# Patient Record
Sex: Female | Born: 1965
Health system: Southern US, Community
[De-identification: ages and names within clinical notes are randomized; demographics above are authoritative.]

## PROBLEM LIST (undated history)

## (undated) DIAGNOSIS — K602 Anal fissure, unspecified: Secondary | ICD-10-CM

## (undated) DIAGNOSIS — E8801 Alpha-1-antitrypsin deficiency: Secondary | ICD-10-CM

## (undated) DIAGNOSIS — F419 Anxiety disorder, unspecified: Secondary | ICD-10-CM

## (undated) DIAGNOSIS — J45909 Unspecified asthma, uncomplicated: Secondary | ICD-10-CM

## (undated) DIAGNOSIS — G43909 Migraine, unspecified, not intractable, without status migrainosus: Secondary | ICD-10-CM

## (undated) DIAGNOSIS — M797 Fibromyalgia: Secondary | ICD-10-CM

## (undated) DIAGNOSIS — J189 Pneumonia, unspecified organism: Secondary | ICD-10-CM

## (undated) DIAGNOSIS — C801 Malignant (primary) neoplasm, unspecified: Secondary | ICD-10-CM

## (undated) DIAGNOSIS — M329 Systemic lupus erythematosus, unspecified: Secondary | ICD-10-CM

## (undated) DIAGNOSIS — H3530 Unspecified macular degeneration: Secondary | ICD-10-CM

## (undated) DIAGNOSIS — N3010 Interstitial cystitis (chronic) without hematuria: Secondary | ICD-10-CM

## (undated) DIAGNOSIS — K5090 Crohn's disease, unspecified, without complications: Secondary | ICD-10-CM

## (undated) HISTORY — PX: WRIST SURGERY: SHX841

## (undated) HISTORY — DX: Alpha-1-antitrypsin deficiency: E88.01

## (undated) HISTORY — DX: Unspecified asthma, uncomplicated: J45.909

## (undated) HISTORY — PX: BLADDER SURGERY: SHX569

## (undated) HISTORY — DX: Systemic lupus erythematosus, unspecified: M32.9

## (undated) HISTORY — DX: Crohn's disease, unspecified, without complications: K50.90

## (undated) HISTORY — PX: KNEE ARTHROSCOPY: SUR90

## (undated) HISTORY — DX: Anxiety disorder, unspecified: F41.9

## (undated) HISTORY — PX: SALPINGECTOMY: SHX328

## (undated) HISTORY — DX: Migraine, unspecified, not intractable, without status migrainosus: G43.909

## (undated) HISTORY — PX: COLONOSCOPY: SHX174

## (undated) HISTORY — DX: Unspecified macular degeneration: H35.30

## (undated) HISTORY — DX: Anal fissure, unspecified: K60.2

## (undated) HISTORY — PX: OOPHORECTOMY: SHX86

## (undated) HISTORY — DX: Pneumonia, unspecified organism: J18.9

## (undated) HISTORY — DX: Fibromyalgia: M79.7

## (undated) HISTORY — DX: Interstitial cystitis (chronic) without hematuria: N30.10

## (undated) HISTORY — PX: CYSTOSCOPY: SUR368

## (undated) HISTORY — DX: Malignant (primary) neoplasm, unspecified: C80.1

## (undated) HISTORY — PX: OTHER SURGICAL HISTORY: SHX169

## (undated) HISTORY — PX: TONSILLECTOMY: SUR1361

---

## 1997-12-07 ENCOUNTER — Other Ambulatory Visit: Admission: RE | Admit: 1997-12-07 | Discharge: 1997-12-07 | Payer: Self-pay | Admitting: Obstetrics and Gynecology

## 1998-06-23 ENCOUNTER — Ambulatory Visit (HOSPITAL_COMMUNITY): Admission: RE | Admit: 1998-06-23 | Discharge: 1998-06-23 | Payer: Self-pay | Admitting: Urology

## 1998-12-13 ENCOUNTER — Other Ambulatory Visit: Admission: RE | Admit: 1998-12-13 | Discharge: 1998-12-13 | Payer: Self-pay | Admitting: Obstetrics and Gynecology

## 1999-03-21 ENCOUNTER — Encounter: Admission: RE | Admit: 1999-03-21 | Discharge: 1999-06-19 | Payer: Self-pay | Admitting: Urology

## 2000-01-22 ENCOUNTER — Other Ambulatory Visit: Admission: RE | Admit: 2000-01-22 | Discharge: 2000-01-22 | Payer: Self-pay | Admitting: Obstetrics and Gynecology

## 2001-06-25 ENCOUNTER — Other Ambulatory Visit: Admission: RE | Admit: 2001-06-25 | Discharge: 2001-06-25 | Payer: Self-pay | Admitting: Obstetrics and Gynecology

## 2002-02-16 ENCOUNTER — Encounter (INDEPENDENT_AMBULATORY_CARE_PROVIDER_SITE_OTHER): Payer: Self-pay | Admitting: Specialist

## 2002-02-16 ENCOUNTER — Ambulatory Visit (HOSPITAL_BASED_OUTPATIENT_CLINIC_OR_DEPARTMENT_OTHER): Admission: RE | Admit: 2002-02-16 | Discharge: 2002-02-16 | Payer: Self-pay | Admitting: Orthopedic Surgery

## 2002-05-07 ENCOUNTER — Encounter: Payer: Self-pay | Admitting: Emergency Medicine

## 2002-05-08 ENCOUNTER — Encounter (INDEPENDENT_AMBULATORY_CARE_PROVIDER_SITE_OTHER): Payer: Self-pay | Admitting: Specialist

## 2002-05-09 ENCOUNTER — Inpatient Hospital Stay (HOSPITAL_COMMUNITY): Admission: AD | Admit: 2002-05-09 | Discharge: 2002-05-12 | Payer: Self-pay | Admitting: Obstetrics and Gynecology

## 2002-07-22 ENCOUNTER — Other Ambulatory Visit: Admission: RE | Admit: 2002-07-22 | Discharge: 2002-07-22 | Payer: Self-pay | Admitting: Obstetrics and Gynecology

## 2003-08-24 ENCOUNTER — Other Ambulatory Visit: Admission: RE | Admit: 2003-08-24 | Discharge: 2003-08-24 | Payer: Self-pay | Admitting: Obstetrics and Gynecology

## 2004-03-28 ENCOUNTER — Encounter: Payer: Self-pay | Admitting: Gastroenterology

## 2004-03-28 ENCOUNTER — Ambulatory Visit (HOSPITAL_COMMUNITY): Admission: RE | Admit: 2004-03-28 | Discharge: 2004-03-28 | Payer: Self-pay | Admitting: Unknown Physician Specialty

## 2004-03-28 ENCOUNTER — Encounter (INDEPENDENT_AMBULATORY_CARE_PROVIDER_SITE_OTHER): Payer: Self-pay | Admitting: Specialist

## 2004-07-27 ENCOUNTER — Ambulatory Visit: Payer: Self-pay | Admitting: Gastroenterology

## 2004-08-03 ENCOUNTER — Ambulatory Visit: Payer: Self-pay | Admitting: Gastroenterology

## 2004-08-13 ENCOUNTER — Ambulatory Visit: Payer: Self-pay | Admitting: Gastroenterology

## 2004-08-21 ENCOUNTER — Ambulatory Visit: Payer: Self-pay | Admitting: Gastroenterology

## 2004-09-12 ENCOUNTER — Other Ambulatory Visit: Admission: RE | Admit: 2004-09-12 | Discharge: 2004-09-12 | Payer: Self-pay | Admitting: Obstetrics and Gynecology

## 2004-09-18 ENCOUNTER — Ambulatory Visit: Payer: Self-pay | Admitting: Gastroenterology

## 2004-10-25 ENCOUNTER — Ambulatory Visit: Payer: Self-pay | Admitting: Hematology and Oncology

## 2004-12-11 ENCOUNTER — Ambulatory Visit (HOSPITAL_COMMUNITY): Admission: RE | Admit: 2004-12-11 | Discharge: 2004-12-11 | Payer: Self-pay | Admitting: Obstetrics and Gynecology

## 2004-12-11 ENCOUNTER — Ambulatory Visit: Payer: Self-pay | Admitting: Family Medicine

## 2005-03-07 ENCOUNTER — Inpatient Hospital Stay (HOSPITAL_COMMUNITY): Admission: AD | Admit: 2005-03-07 | Discharge: 2005-03-07 | Payer: Self-pay | Admitting: Obstetrics and Gynecology

## 2005-03-09 ENCOUNTER — Inpatient Hospital Stay (HOSPITAL_COMMUNITY): Admission: AD | Admit: 2005-03-09 | Discharge: 2005-03-09 | Payer: Self-pay | Admitting: Obstetrics and Gynecology

## 2005-04-01 ENCOUNTER — Inpatient Hospital Stay (HOSPITAL_COMMUNITY): Admission: RE | Admit: 2005-04-01 | Discharge: 2005-04-04 | Payer: Self-pay | Admitting: Obstetrics and Gynecology

## 2005-04-08 ENCOUNTER — Inpatient Hospital Stay (HOSPITAL_COMMUNITY): Admission: AD | Admit: 2005-04-08 | Discharge: 2005-04-08 | Payer: Self-pay | Admitting: Obstetrics and Gynecology

## 2005-04-26 ENCOUNTER — Inpatient Hospital Stay (HOSPITAL_COMMUNITY): Admission: AD | Admit: 2005-04-26 | Discharge: 2005-04-26 | Payer: Self-pay | Admitting: Obstetrics and Gynecology

## 2005-05-14 ENCOUNTER — Other Ambulatory Visit: Admission: RE | Admit: 2005-05-14 | Discharge: 2005-05-14 | Payer: Self-pay | Admitting: Obstetrics and Gynecology

## 2005-06-04 ENCOUNTER — Ambulatory Visit: Payer: Self-pay | Admitting: Family Medicine

## 2005-09-16 DIAGNOSIS — J189 Pneumonia, unspecified organism: Secondary | ICD-10-CM

## 2005-09-16 HISTORY — DX: Pneumonia, unspecified organism: J18.9

## 2005-11-20 ENCOUNTER — Ambulatory Visit: Payer: Self-pay | Admitting: Internal Medicine

## 2006-10-31 ENCOUNTER — Ambulatory Visit: Payer: Self-pay | Admitting: Family Medicine

## 2007-07-29 ENCOUNTER — Ambulatory Visit: Payer: Self-pay | Admitting: Internal Medicine

## 2007-07-29 ENCOUNTER — Encounter: Payer: Self-pay | Admitting: Family Medicine

## 2007-07-29 ENCOUNTER — Ambulatory Visit: Payer: Self-pay | Admitting: Cardiovascular Disease

## 2007-07-29 ENCOUNTER — Observation Stay (HOSPITAL_COMMUNITY): Admission: EM | Admit: 2007-07-29 | Discharge: 2007-07-30 | Payer: Self-pay | Admitting: *Deleted

## 2007-07-30 ENCOUNTER — Encounter: Payer: Self-pay | Admitting: Internal Medicine

## 2007-08-04 ENCOUNTER — Ambulatory Visit: Payer: Self-pay | Admitting: Family Medicine

## 2007-08-04 DIAGNOSIS — L930 Discoid lupus erythematosus: Secondary | ICD-10-CM | POA: Insufficient documentation

## 2007-08-04 DIAGNOSIS — M329 Systemic lupus erythematosus, unspecified: Secondary | ICD-10-CM | POA: Insufficient documentation

## 2007-08-04 DIAGNOSIS — R079 Chest pain, unspecified: Secondary | ICD-10-CM | POA: Insufficient documentation

## 2007-08-04 DIAGNOSIS — N3010 Interstitial cystitis (chronic) without hematuria: Secondary | ICD-10-CM | POA: Insufficient documentation

## 2007-08-04 DIAGNOSIS — J069 Acute upper respiratory infection, unspecified: Secondary | ICD-10-CM | POA: Insufficient documentation

## 2007-08-04 DIAGNOSIS — Z87448 Personal history of other diseases of urinary system: Secondary | ICD-10-CM | POA: Insufficient documentation

## 2007-08-04 DIAGNOSIS — M797 Fibromyalgia: Secondary | ICD-10-CM | POA: Insufficient documentation

## 2007-08-04 LAB — CONVERTED CEMR LAB
Bilirubin Urine: NEGATIVE
Glucose, Urine, Semiquant: NEGATIVE
Ketones, urine, test strip: NEGATIVE
Nitrite: NEGATIVE
Protein, U semiquant: NEGATIVE

## 2007-08-05 LAB — CONVERTED CEMR LAB
ALT: 12 units/L (ref 0–35)
Albumin: 4.6 g/dL (ref 3.5–5.2)
BUN: 11 mg/dL (ref 6–23)
Bilirubin, Direct: 0.1 mg/dL (ref 0.0–0.3)
Calcium: 9.6 mg/dL (ref 8.4–10.5)
Eosinophils Relative: 1.6 % (ref 0.0–5.0)
Folate: 14.4 ng/mL
GFR calc Af Amer: 120 mL/min
GFR calc non Af Amer: 99 mL/min
Glucose, Bld: 96 mg/dL (ref 70–99)
Lymphocytes Relative: 35.1 % (ref 12.0–46.0)
MCV: 91.5 fL (ref 78.0–100.0)
Monocytes Relative: 8.7 % (ref 3.0–11.0)
Neutrophils Relative %: 53.9 % (ref 43.0–77.0)
Potassium: 4.2 meq/L (ref 3.5–5.1)
RBC: 4.35 M/uL (ref 3.87–5.11)
Total Bilirubin: 0.9 mg/dL (ref 0.3–1.2)
Total Protein: 8 g/dL (ref 6.0–8.3)

## 2007-08-07 ENCOUNTER — Encounter (INDEPENDENT_AMBULATORY_CARE_PROVIDER_SITE_OTHER): Payer: Self-pay | Admitting: *Deleted

## 2007-08-19 ENCOUNTER — Ambulatory Visit: Payer: Self-pay

## 2007-08-19 ENCOUNTER — Encounter: Payer: Self-pay | Admitting: Family Medicine

## 2007-11-03 ENCOUNTER — Ambulatory Visit: Payer: Self-pay | Admitting: Family Medicine

## 2007-11-03 ENCOUNTER — Telehealth (INDEPENDENT_AMBULATORY_CARE_PROVIDER_SITE_OTHER): Payer: Self-pay | Admitting: *Deleted

## 2007-11-03 DIAGNOSIS — R300 Dysuria: Secondary | ICD-10-CM | POA: Insufficient documentation

## 2007-11-03 DIAGNOSIS — R0602 Shortness of breath: Secondary | ICD-10-CM | POA: Insufficient documentation

## 2007-11-03 DIAGNOSIS — B9789 Other viral agents as the cause of diseases classified elsewhere: Secondary | ICD-10-CM | POA: Insufficient documentation

## 2007-11-03 DIAGNOSIS — R599 Enlarged lymph nodes, unspecified: Secondary | ICD-10-CM | POA: Insufficient documentation

## 2007-11-03 DIAGNOSIS — R509 Fever, unspecified: Secondary | ICD-10-CM | POA: Insufficient documentation

## 2007-11-03 LAB — CONVERTED CEMR LAB
Protein, U semiquant: NEGATIVE
Specific Gravity, Urine: 1.02
WBC Urine, dipstick: NEGATIVE

## 2007-11-04 ENCOUNTER — Encounter: Payer: Self-pay | Admitting: Family Medicine

## 2007-11-04 ENCOUNTER — Telehealth (INDEPENDENT_AMBULATORY_CARE_PROVIDER_SITE_OTHER): Payer: Self-pay | Admitting: *Deleted

## 2007-11-04 LAB — CONVERTED CEMR LAB
ALT: 10 units/L (ref 0–35)
Basophils Relative: 0 % (ref 0.0–1.0)
Calcium: 9.3 mg/dL (ref 8.4–10.5)
Eosinophils Absolute: 0 10*3/uL (ref 0.0–0.6)
Eosinophils Relative: 0.1 % (ref 0.0–5.0)
GFR calc Af Amer: 103 mL/min
GFR calc non Af Amer: 85 mL/min
Glucose, Bld: 120 mg/dL — ABNORMAL HIGH (ref 70–99)
MCHC: 33.5 g/dL (ref 30.0–36.0)
MCV: 90.4 fL (ref 78.0–100.0)
Monocytes Absolute: 0.5 10*3/uL (ref 0.2–0.7)
Neutrophils Relative %: 93.8 % — ABNORMAL HIGH (ref 43.0–77.0)
Potassium: 4 meq/L (ref 3.5–5.1)
RDW: 12.4 % (ref 11.5–14.6)
Sodium: 138 meq/L (ref 135–145)
Total Bilirubin: 1.2 mg/dL (ref 0.3–1.2)
Total Protein: 7.6 g/dL (ref 6.0–8.3)

## 2007-11-05 ENCOUNTER — Ambulatory Visit: Payer: Self-pay | Admitting: Family Medicine

## 2007-11-05 DIAGNOSIS — E0590 Thyrotoxicosis, unspecified without thyrotoxic crisis or storm: Secondary | ICD-10-CM | POA: Insufficient documentation

## 2007-11-05 LAB — CONVERTED CEMR LAB: Rapid Strep: NEGATIVE

## 2007-11-06 ENCOUNTER — Telehealth (INDEPENDENT_AMBULATORY_CARE_PROVIDER_SITE_OTHER): Payer: Self-pay | Admitting: *Deleted

## 2007-11-19 ENCOUNTER — Ambulatory Visit: Payer: Self-pay | Admitting: Family Medicine

## 2007-11-19 DIAGNOSIS — D7289 Other specified disorders of white blood cells: Secondary | ICD-10-CM | POA: Insufficient documentation

## 2007-11-20 LAB — CONVERTED CEMR LAB
Eosinophils Relative: 1.1 % (ref 0.0–5.0)
Free T4: 0.8 ng/dL (ref 0.6–1.6)
Hemoglobin: 12.5 g/dL (ref 12.0–15.0)
Lymphocytes Relative: 33.2 % (ref 12.0–46.0)
Monocytes Absolute: 0.7 10*3/uL (ref 0.2–0.7)
Neutro Abs: 4.5 10*3/uL (ref 1.4–7.7)
Neutrophils Relative %: 55.7 % (ref 43.0–77.0)
RBC: 4.12 M/uL (ref 3.87–5.11)
RDW: 12.9 % (ref 11.5–14.6)
T3, Free: 3 pg/mL (ref 2.3–4.2)

## 2007-11-30 ENCOUNTER — Encounter (INDEPENDENT_AMBULATORY_CARE_PROVIDER_SITE_OTHER): Payer: Self-pay | Admitting: *Deleted

## 2007-12-02 ENCOUNTER — Telehealth (INDEPENDENT_AMBULATORY_CARE_PROVIDER_SITE_OTHER): Payer: Self-pay | Admitting: *Deleted

## 2007-12-03 ENCOUNTER — Ambulatory Visit: Payer: Self-pay | Admitting: Family Medicine

## 2007-12-03 ENCOUNTER — Telehealth: Payer: Self-pay | Admitting: Family Medicine

## 2007-12-03 DIAGNOSIS — R059 Cough, unspecified: Secondary | ICD-10-CM | POA: Insufficient documentation

## 2007-12-03 DIAGNOSIS — J020 Streptococcal pharyngitis: Secondary | ICD-10-CM | POA: Insufficient documentation

## 2007-12-03 DIAGNOSIS — R05 Cough: Secondary | ICD-10-CM

## 2007-12-03 LAB — CONVERTED CEMR LAB
Inflenza A Ag: NEGATIVE
Rapid Strep: POSITIVE

## 2007-12-13 LAB — CONVERTED CEMR LAB
AST: 19 units/L (ref 0–37)
BUN: 6 mg/dL (ref 6–23)
Basophils Absolute: 0.1 10*3/uL (ref 0.0–0.1)
Basophils Relative: 0.8 % (ref 0.0–1.0)
Bilirubin, Direct: 0.1 mg/dL (ref 0.0–0.3)
CO2: 25 meq/L (ref 19–32)
Calcium: 9.1 mg/dL (ref 8.4–10.5)
Chloride: 102 meq/L (ref 96–112)
Creatinine, Ser: 0.7 mg/dL (ref 0.4–1.2)
Eosinophils Absolute: 0 10*3/uL (ref 0.0–0.6)
Glucose, Bld: 92 mg/dL (ref 70–99)
Lymphocytes Relative: 7.2 % — ABNORMAL LOW (ref 12.0–46.0)
Monocytes Absolute: 1.1 10*3/uL — ABNORMAL HIGH (ref 0.2–0.7)
RBC: 4.18 M/uL (ref 3.87–5.11)
RDW: 13.4 % (ref 11.5–14.6)
Sodium: 137 meq/L (ref 135–145)
WBC: 13.1 10*3/uL — ABNORMAL HIGH (ref 4.5–10.5)

## 2007-12-14 ENCOUNTER — Encounter (INDEPENDENT_AMBULATORY_CARE_PROVIDER_SITE_OTHER): Payer: Self-pay | Admitting: *Deleted

## 2008-06-01 ENCOUNTER — Encounter: Payer: Self-pay | Admitting: Family Medicine

## 2008-06-29 DIAGNOSIS — K5090 Crohn's disease, unspecified, without complications: Secondary | ICD-10-CM | POA: Insufficient documentation

## 2008-08-05 ENCOUNTER — Ambulatory Visit: Payer: Self-pay | Admitting: Gastroenterology

## 2008-08-05 DIAGNOSIS — R197 Diarrhea, unspecified: Secondary | ICD-10-CM | POA: Insufficient documentation

## 2008-08-05 LAB — CONVERTED CEMR LAB
ALT: 14 units/L (ref 0–35)
Albumin: 4.4 g/dL (ref 3.5–5.2)
Alkaline Phosphatase: 63 units/L (ref 39–117)
BUN: 12 mg/dL (ref 6–23)
Bilirubin, Direct: 0.1 mg/dL (ref 0.0–0.3)
Chloride: 102 meq/L (ref 96–112)
Creatinine, Ser: 0.7 mg/dL (ref 0.4–1.2)
Folate: >20 ng/mL
GFR calc Af Amer: 119 mL/min
GFR calc non Af Amer: 99 mL/min
Glucose, Bld: 90 mg/dL (ref 70–99)
HCT: 39.2 % (ref 36.0–46.0)
Hemoglobin: 13.6 g/dL (ref 12.0–15.0)
Monocytes Absolute: 0.8 10*3/uL (ref 0.1–1.0)
Neutro Abs: 4.2 10*3/uL (ref 1.4–7.7)
Neutrophils Relative %: 51.6 % (ref 43.0–77.0)
Platelets: 269 10*3/uL (ref 150–400)
Potassium: 4.6 meq/L (ref 3.5–5.1)
RBC: 4.3 M/uL (ref 3.87–5.11)
RDW: 12.4 % (ref 11.5–14.6)
Sodium: 137 meq/L (ref 135–145)
TSH: 0.48 microintl units/mL (ref 0.35–5.50)
Total Bilirubin: 0.9 mg/dL (ref 0.3–1.2)
Total Protein: 7.5 g/dL (ref 6.0–8.3)
Transferrin: 312.2 mg/dL (ref >=212.0)

## 2008-08-19 ENCOUNTER — Ambulatory Visit: Payer: Self-pay | Admitting: Gastroenterology

## 2008-08-19 DIAGNOSIS — R143 Flatulence: Secondary | ICD-10-CM

## 2008-08-19 DIAGNOSIS — R142 Eructation: Secondary | ICD-10-CM

## 2008-08-19 DIAGNOSIS — R141 Gas pain: Secondary | ICD-10-CM | POA: Insufficient documentation

## 2008-09-01 ENCOUNTER — Ambulatory Visit: Payer: Self-pay | Admitting: Gastroenterology

## 2008-09-05 ENCOUNTER — Ambulatory Visit: Payer: Self-pay | Admitting: Genetic Counselor

## 2008-09-07 ENCOUNTER — Telehealth: Payer: Self-pay | Admitting: Gastroenterology

## 2008-09-30 ENCOUNTER — Ambulatory Visit: Payer: Self-pay | Admitting: Gastroenterology

## 2008-09-30 ENCOUNTER — Encounter: Payer: Self-pay | Admitting: Gastroenterology

## 2008-10-03 ENCOUNTER — Telehealth: Payer: Self-pay | Admitting: Gastroenterology

## 2008-10-06 ENCOUNTER — Ambulatory Visit: Payer: Self-pay | Admitting: Gastroenterology

## 2008-10-06 ENCOUNTER — Encounter: Payer: Self-pay | Admitting: Gastroenterology

## 2008-10-06 ENCOUNTER — Ambulatory Visit (HOSPITAL_COMMUNITY): Admission: RE | Admit: 2008-10-06 | Discharge: 2008-10-06 | Payer: Self-pay | Admitting: Gastroenterology

## 2008-10-31 ENCOUNTER — Encounter (INDEPENDENT_AMBULATORY_CARE_PROVIDER_SITE_OTHER): Payer: Self-pay | Admitting: *Deleted

## 2009-01-11 ENCOUNTER — Ambulatory Visit: Payer: Self-pay | Admitting: Family Medicine

## 2009-01-11 DIAGNOSIS — J019 Acute sinusitis, unspecified: Secondary | ICD-10-CM | POA: Insufficient documentation

## 2009-01-11 DIAGNOSIS — F329 Major depressive disorder, single episode, unspecified: Secondary | ICD-10-CM | POA: Insufficient documentation

## 2009-01-11 DIAGNOSIS — F3289 Other specified depressive episodes: Secondary | ICD-10-CM | POA: Insufficient documentation

## 2009-01-11 LAB — CONVERTED CEMR LAB: Rapid Strep: NEGATIVE

## 2009-04-24 ENCOUNTER — Telehealth (INDEPENDENT_AMBULATORY_CARE_PROVIDER_SITE_OTHER): Payer: Self-pay | Admitting: *Deleted

## 2009-05-25 ENCOUNTER — Ambulatory Visit: Payer: Self-pay | Admitting: Family Medicine

## 2009-06-29 ENCOUNTER — Telehealth: Payer: Self-pay | Admitting: Family Medicine

## 2009-06-29 ENCOUNTER — Ambulatory Visit: Payer: Self-pay | Admitting: Family Medicine

## 2009-08-25 ENCOUNTER — Telehealth (INDEPENDENT_AMBULATORY_CARE_PROVIDER_SITE_OTHER): Payer: Self-pay | Admitting: *Deleted

## 2009-08-29 ENCOUNTER — Encounter: Payer: Self-pay | Admitting: Family Medicine

## 2009-09-21 ENCOUNTER — Telehealth (INDEPENDENT_AMBULATORY_CARE_PROVIDER_SITE_OTHER): Payer: Self-pay | Admitting: *Deleted

## 2009-10-05 ENCOUNTER — Telehealth: Payer: Self-pay | Admitting: Family Medicine

## 2009-11-23 ENCOUNTER — Telehealth: Payer: Self-pay | Admitting: Family Medicine

## 2009-11-23 ENCOUNTER — Encounter: Payer: Self-pay | Admitting: Family Medicine

## 2010-01-17 ENCOUNTER — Telehealth (INDEPENDENT_AMBULATORY_CARE_PROVIDER_SITE_OTHER): Payer: Self-pay | Admitting: *Deleted

## 2010-02-22 ENCOUNTER — Encounter: Payer: Self-pay | Admitting: Family Medicine

## 2010-02-26 ENCOUNTER — Telehealth (INDEPENDENT_AMBULATORY_CARE_PROVIDER_SITE_OTHER): Payer: Self-pay | Admitting: *Deleted

## 2010-03-05 ENCOUNTER — Ambulatory Visit: Payer: Self-pay | Admitting: Gastroenterology

## 2010-03-05 LAB — CONVERTED CEMR LAB
AST: 19 units/L (ref 0–37)
Basophils Absolute: 0 10*3/uL (ref 0.0–0.1)
Basophils Relative: 0.3 % (ref 0.0–3.0)
Bilirubin, Direct: 0.1 mg/dL (ref 0.0–0.3)
Calcium: 8.8 mg/dL (ref 8.4–10.5)
Chloride: 106 meq/L (ref 96–112)
Creatinine, Ser: 0.7 mg/dL (ref 0.4–1.2)
Eosinophils Absolute: 0.1 10*3/uL (ref 0.0–0.7)
Eosinophils Relative: 0.8 % (ref 0.0–5.0)
Ferritin: 14.3 ng/mL (ref 10.0–291.0)
Folate: 10.8 ng/mL
GFR calc non Af Amer: 99.19 mL/min (ref >60)
HCT: 37.2 % (ref 36.0–46.0)
Iron: 51 ug/dL (ref 42–145)
Lymphocytes Relative: 22.7 % (ref 12.0–46.0)
Lymphs Abs: 2 10*3/uL (ref 0.7–4.0)
Monocytes Absolute: 0.6 10*3/uL (ref 0.1–1.0)
Potassium: 4.1 meq/L (ref 3.5–5.1)
Vitamin B-12: 302 pg/mL (ref 211–911)

## 2010-03-06 ENCOUNTER — Encounter: Payer: Self-pay | Admitting: Gastroenterology

## 2010-03-16 ENCOUNTER — Telehealth: Payer: Self-pay | Admitting: Family Medicine

## 2010-04-12 ENCOUNTER — Telehealth: Payer: Self-pay | Admitting: Family Medicine

## 2010-04-13 ENCOUNTER — Telehealth (INDEPENDENT_AMBULATORY_CARE_PROVIDER_SITE_OTHER): Payer: Self-pay | Admitting: *Deleted

## 2010-05-25 ENCOUNTER — Telehealth: Payer: Self-pay | Admitting: Family Medicine

## 2010-06-14 ENCOUNTER — Telehealth (INDEPENDENT_AMBULATORY_CARE_PROVIDER_SITE_OTHER): Payer: Self-pay | Admitting: *Deleted

## 2010-06-21 ENCOUNTER — Ambulatory Visit: Payer: Self-pay | Admitting: Family Medicine

## 2010-07-12 ENCOUNTER — Telehealth: Payer: Self-pay | Admitting: Family Medicine

## 2010-09-12 ENCOUNTER — Ambulatory Visit
Admission: RE | Admit: 2010-09-12 | Discharge: 2010-09-12 | Payer: Self-pay | Source: Home / Self Care | Attending: Family | Admitting: Family

## 2010-09-27 ENCOUNTER — Telehealth: Payer: Self-pay | Admitting: Family Medicine

## 2010-10-08 ENCOUNTER — Telehealth (INDEPENDENT_AMBULATORY_CARE_PROVIDER_SITE_OTHER): Payer: Self-pay | Admitting: *Deleted

## 2010-10-16 NOTE — Letter (Signed)
Summary: [place]   Imported By: Edmonia James 10/03/2009 13:46:03  _____________________________________________________________________  External Attachment:    Type:   Image     Comment:   External Document

## 2010-10-16 NOTE — Progress Notes (Signed)
Summary: Schedule REV   Phone Note Outgoing Call Call back at [facility] Phone [phone]   Call placed by: Bernita Buffy CMA Deborra Medina),  Jan 17, 2010 9:18 AM Call placed to: Patient Summary of Call: pt needs rev, she has been [facility] and [facility] twice. She needs to follow up with Dr. Sharlett Iles.   Initial call taken by: Bernita Buffy CMA Deborra Medina),  Jan 17, 2010 9:18 AM  Follow-up for Phone Call        PT [place] Follow-up by: Lucien Mons,  Jan 22, 2010 3:12 PM

## 2010-10-16 NOTE — Progress Notes (Signed)
Summary: Refill Request  Phone Note Refill Request Call back at [phone] Message from:  Pharmacy on March 16, 2010 8:31 AM  Refills Requested: Medication #1:  ALPRAZOLAM 0.5 MG TABS tAKE ONE TO 3 TABLETS DAILY AS NEEDED.   Dosage confirmed as above?Dosage Confirmed   Supply Requested: 1 month   Last Refilled: 01/23/2010 [place]   Next Appointment Scheduled: none Initial call taken by: Osborn Coho,  March 16, 2010 8:32 AM  Follow-up for Phone Call        last ov- 06/29/09- acute only. Allyn Kenner CMA  March 16, 2010 8:39 AM   Additional Follow-up for Phone Call Additional follow up Details #1::        ok for #30, no refills. Additional Follow-up by: Annye Asa MD,  March 16, 2010 8:43 AM    Prescriptions: ALPRAZOLAM 0.5 MG TABS (ALPRAZOLAM) tAKE ONE TO 3 TABLETS DAILY AS NEEDED.  #30 x 0   Entered by:   Allyn Kenner CMA   Authorized by:   Annye Asa MD   Signed by:   Allyn Kenner CMA on 03/16/2010   Method used:   Printed then faxed to ...       [id number] (retail)       [address]       [place], [place]  [address]       Ph: [phone] or [phone]       Fax: [phone]   RxID:   [id number]

## 2010-10-16 NOTE — Letter (Signed)
Summary: [place]   Imported By: Edmonia James 12/06/2009 10:37:03  _____________________________________________________________________  External Attachment:    Type:   Image     Comment:   External Document

## 2010-10-16 NOTE — Progress Notes (Signed)
Summary: REFILL REQUEST  Phone Note Refill Request Call back at [phone] Message from:  Pharmacy on April 13, 2010 10:00 AM  Refills Requested: Medication #1:  ALPRAZOLAM 0.5 MG TABS tAKE ONE TO 3 TABLETS DAILY AS NEEDED.   Dosage confirmed as above?Dosage Confirmed   Supply Requested: 3 months   Notes: THIS WAS FILLED THURSDAY. HOWEVER PT USUALLY GETS #[address] [facility] [address]  Next Appointment Scheduled: NONE Initial call taken by: Osborn Coho,  April 13, 2010 10:01 AM  Follow-up for Phone Call        PT AWARE RX RESENT FOR #90 ............Marland KitchenFelecia Deloach CMA  April 13, 2010 10:38 AM     Prescriptions: ALPRAZOLAM 0.5 MG TABS (ALPRAZOLAM) tAKE ONE TO 3 TABLETS DAILY AS NEEDED.  #90 x 0   Entered by:   Rolla Flatten CMA   Authorized by:   Garnet Koyanagi DO   Signed by:   Rolla Flatten CMA on 04/13/2010   Method used:   Telephoned to ...       [id number] (retail)       [address]       [place], [place]  [address]       Ph: [phone] or [phone]       Fax: [phone]   RxID:   [id number]

## 2010-10-16 NOTE — Progress Notes (Signed)
Summary: BUPROPION WL 150 MG  REFILL  Phone Note Refill Request Message from:  Fax from Pharmacy on June 14, 2010 2:17 PM  Refills Requested: Medication #1:  WELLBUTRIN XL 150 MG XR24H-TAB 3 by mouth once daily   Last Refilled: 04/27/2010 [facility], [address],  FAX [phone]  Initial call taken by: Berneta Sages,  June 14, 2010 2:17 PM  Follow-up for Phone Call        Last OV 03/05/10 last filled 04/27/10. Please advise Follow-up by: Aron Baba CMA Deborra Medina),  June 14, 2010 3:29 PM  Additional Follow-up for Phone Call Additional follow up Details #1::        ok to refill x 6 months Additional Follow-up by: Garnet Koyanagi DO,  June 14, 2010 3:39 PM    Prescriptions: WELLBUTRIN XL 150 MG XR24H-TAB (BUPROPION HCL) 3 by mouth once daily  #90 Tablet x 6   Entered by:   Malachi Bonds CMA   Authorized by:   Garnet Koyanagi DO   Signed by:   Malachi Bonds CMA on 06/14/2010   Method used:   Electronically to        [id number] (retail)       [address]       [place], [place]  [address]       Ph: [phone] or [phone]       Fax: [phone]   RxID:   [id number]

## 2010-10-16 NOTE — Progress Notes (Signed)
Summary: sinus infection  Phone Note Call from Patient   Caller: Patient Summary of Call: pt called stating that she has a sinusitis infection and would like to be seen today. informed pt that dr Etter Sjogren does not have any opening for today but can look at another day pt decline stating that she has had this infection for 3 weeks and she really feels bad. pt states that she is going to UC...................Marland KitchenFelecia Deloach CMA  September 21, 2009 3:08 PM

## 2010-10-16 NOTE — Progress Notes (Signed)
Summary: REFILL  Phone Note Refill Request Message from:  Fax from Pharmacy on [place] [phone]  Refills Requested: Medication #1:  ALPRAZOLAM 0.5 MG TABS tAKE ONE TO 3 TABLETS DAILY AS NEEDED.   Last Refilled: 05/25/2009 last ov- 06/29/09  Initial call taken by: Silva Bandy,  October 05, 2009 10:15 AM  Follow-up for Phone Call        ok to refill x1 Follow-up by: Garnet Koyanagi DO,  October 05, 2009 11:07 AM    Prescriptions: ALPRAZOLAM 0.5 MG TABS (ALPRAZOLAM) tAKE ONE TO 3 TABLETS DAILY AS NEEDED.  #90 x 0   Entered by:   Allyn Kenner CMA   Authorized by:   Garnet Koyanagi DO   Signed by:   Allyn Kenner CMA on 10/05/2009   Method used:   Print then Give to Patient   RxID:   [id number]

## 2010-10-16 NOTE — Progress Notes (Signed)
Summary: Refil Request  Phone Note Refill Request Message from:  Pharmacy on [place] [facility] [address]. Fax #: [phone]  Refills Requested: Medication #1:  ALPRAZOLAM 0.5 MG TABS tAKE ONE TO 3 TABLETS DAILY AS NEEDED.   Dosage confirmed as above?Dosage Confirmed   Supply Requested: 1 month   Last Refilled: 10/05/2009 Next Appointment Scheduled: none Initial call taken by: Elna Breslow,  November 23, 2009 9:33 AM  Follow-up for Phone Call        last ov- 06/29/09. [place]  November 23, 2009 9:41 AM   Additional Follow-up for Phone Call Additional follow up Details #1::        ok to refill x1  1 refill Additional Follow-up by: Garnet Koyanagi DO,  November 23, 2009 9:44 AM    Prescriptions: ALPRAZOLAM 0.5 MG TABS (ALPRAZOLAM) tAKE ONE TO 3 TABLETS DAILY AS NEEDED.  #90 x 1   Entered by:   Allyn Kenner CMA   Authorized by:   Garnet Koyanagi DO   Signed by:   Allyn Kenner CMA on 11/23/2009   Method used:   Printed then faxed to ...       [id number] (retail)       [address]       [place], [place]  [address]       Ph: [phone] or [phone]       Fax: [phone]   RxID:   [id number]

## 2010-10-16 NOTE — Progress Notes (Signed)
Summary: ALPRAZOLAM REFILL  Phone Note Refill Request Message from:  Fax from Pharmacy on May 25, 2010 4:52 PM  Refills Requested: Medication #1:  ALPRAZOLAM 0.5 MG TABS tAKE ONE TO 3 TABLETS DAILY AS NEEDED. [facility], [address],   FAX - NOT CLEAR ON FAX--[phone]  Initial call taken by: Berneta Sages,  May 25, 2010 4:53 PM  Follow-up for Phone Call        last seen 06/29/09 and last filled 7/11 Follow-up by: Aron Baba CMA Deborra Medina),  May 25, 2010 5:47 PM  Additional Follow-up for Phone Call Additional follow up Details #1::        refill x1 Additional Follow-up by: Garnet Koyanagi DO,  May 26, 2010 11:16 AM    Prescriptions: ALPRAZOLAM 0.5 MG TABS (ALPRAZOLAM) tAKE ONE TO 3 TABLETS DAILY AS NEEDED.  #90 x 0   Entered by:   Aron Baba CMA ([place])   Authorized by:   Garnet Koyanagi DO   Signed by:   Aron Baba CMA ([place]) on 05/28/2010   Method used:   Printed then faxed to ...       [id number] (retail)       [address]       [place], [place]  [address]       Ph: [phone] or [phone]       Fax: [phone]   RxID:   [id number]

## 2010-10-16 NOTE — Assessment & Plan Note (Signed)
Summary: follow up...as.   History of Present Illness Visit Type: Follow-up Visit Primary GI MD: Verl Blalock MD FACP [place] Primary Provider: Garnet Koyanagi DO Requesting Provider: n/a Chief Complaint: follow up crohn's, pt has had food poisoning and is still having diarrhea from that History of Present Illness:   Sheila [name] is a long-term patient of mine who has Crohn's ileocolitis previously documented by colonoscopy and mucosal biopsies. She also has chronic anxiety, an element of IBS, and chronic interstitial cystitis managed by Dr. Alona Bene.  She been doing well until recently when she again experienced" food poisoning" consisting of abdominal cramping and watery diarrhea which seems to be resolving now after 2 weeks of conservative management. She initially had nausea and vomiting which also has abated. She gives a history of multiple recurrences of salmonellosis, etiology unclear. She has not had recent stool cultures, antibiotic exposure, travel, or sick family members at home. She in the past has had reactions to penicillin and ampicillin. She is on alprazolam and Wellbutrin for chronic anxiety syndrome. Other problems include fibromyalgia and questionable low-grade SLE.  She denies systemic complaints but does have some continued soft stooling and abdominal cramping without fever or chills. She denies abuse of alcohol, cigarettes, or NSAIDs.   GI Review of Systems    Reports bloating.      Denies acid reflux, belching, chest pain, dysphagia with liquids, dysphagia with solids, heartburn, loss of appetite, nausea, vomiting, vomiting blood, weight loss, and  weight gain.      Reports diarrhea.     Denies anal fissure, black tarry stools, change in bowel habit, constipation, diverticulosis, fecal incontinence, heme positive stool, hemorrhoids, irritable bowel syndrome, jaundice, light color stool, liver problems, rectal bleeding, and  rectal pain.    Current Medications  (verified): 1)  Alprazolam 0.5 Mg Tabs (Alprazolam) .... Take One To 3 Tablets Daily As Needed. 2)  Wellbutrin Xl 150 Mg Xr24h-Tab (Bupropion Hcl) .... 3 By Mouth Once Daily  Allergies (verified): 1)  ! Pcn 2)  ! Ampicillin 3)  ! Fentanyl Citrate (Fentanyl Citrate)  Past History:  Past medical, surgical, family and social histories (including risk factors) reviewed for relevance to current acute and chronic problems.  Past Medical History: Reviewed history from 08/04/2007 and no changes required. SLE Crohn's  Fibromyalgia ICS  Past Surgical History: Reviewed history from 08/19/2008 and no changes required. C- Section 2006 T & A Left knee arthroscopy Left wrist surgery x 2 (ganglion cyst removal) right fallopian tube removed bladder surgery (for interstitial cystitis)  Family History: Reviewed history from 08/19/2008 and no changes required. Family History of Breast Cancer:Maternal Grandmother, Maternal Great Aunts, Cousin Family History of Colitis/Crohn's: self Family History of Prostate Cancer: grandfather No FH of Colon Cancer:  Social History: Reviewed history from 08/19/2008 and no changes required. Married, 2 boys Patient has never smoked.  Alcohol Use - yes 1 a week Daily Caffeine Use  1/2 cup coffee in the AM Illicit Drug Use - no Patient gets regular exercise.  Review of Systems  The patient denies allergy/sinus, anemia, anxiety-new, arthritis/joint pain, back pain, blood in urine, breast changes/lumps, confusion, cough, coughing up blood, depression-new, fainting, fatigue, fever, headaches-new, hearing problems, heart murmur, heart rhythm changes, itching, menstrual pain, muscle pains/cramps, night sweats, nosebleeds, pregnancy symptoms, shortness of breath, skin rash, sleeping problems, sore throat, swelling of feet/legs, swollen lymph glands, thirst - excessive, urination - excessive, urination changes/pain, urine leakage, vision changes, and voice change.     Vital Signs:  Patient  profile:   45 year old female Height:      63.5 inches Weight:      127 pounds BMI:     22.22 Pulse rate:   72 / minute Pulse rhythm:   regular BP sitting:   90 / 62  (left arm) Cuff size:   regular  Vitals Entered By: Abelino Derrick CMA Deborra Medina) (March 05, 2010 10:01 AM)  Physical Exam  General:  Well developed, well nourished, no acute distress.healthy appearing.   Head:  Normocephalic and atraumatic. Eyes:  PERRLA, no icterus. Neck:  Supple; no masses or thyromegaly. Lungs:  Clear throughout to auscultation. Heart:  Regular rate and rhythm; no murmurs, rubs,  or bruits. Abdomen:  Soft, nontender and nondistended. No masses, hepatosplenomegaly or hernias noted. Normal bowel sounds.Mild distention noted with normal bowel sounds. Rectal:  No Evidence of fissures or fistulae. Rectal exam shows no masses or tenderness with soft mucousy stool which is bloody and guaiac positive. Msk:  Symmetrical with no gross deformities. Normal posture. Pulses:  Normal pulses noted. Extremities:  No clubbing, cyanosis, edema or deformities noted. Neurologic:  Alert and  oriented x4;  grossly normal neurologically. Psych:  Alert and cooperative. Normal mood and affect.   Impression & Recommendations:  Problem # 1:  CROHN'S DISEASE, LARGE INTESTINE (ICD-555.1) Assessment Deteriorated Labs and stool examinations including regular culture, O&P exam, and C. difficile toxin exam ordered. I have started her on Lialda 2.4 g a day pending further workup. I also will try to get her old records for review. Orders: TLB-CBC Platelet - w/Differential (85025-CBCD) TLB-BMP (Basic Metabolic Panel-BMET) (89211-HERDEYC) TLB-Hepatic/Liver Function Pnl (80076-HEPATIC) TLB-TSH (Thyroid Stimulating Hormone) (84443-TSH) TLB-B12, Serum-Total ONLY (14481-E56) TLB-Ferritin (31497-WYO) TLB-Folic Acid (Folate) (37858-IFO) TLB-IBC Pnl (Iron/FE;Transferrin) (83550-IBC) TLB-CRP-High Sensitivity  (C-Reactive Protein) (86140-FCRP) TLB-Sedimentation Rate (ESR) (85652-ESR) T-Culture, Stool (87045/87046-70140) T-Culture, C-Diff Toxin A/B (27741-28786) T-Stool for O&P (76720-94709)  Problem # 2:  DEPRESSIVE DISORDER (ICD-311) Assessment: Improved continue other medications per Dr. Etter Sjogren.  Problem # 3:  CHRONIC INTERSTITIAL CYSTITIS (ICD-595.1) Assessment: Improved Continued followup with Dr. Amalia Hailey as planned  Patient Instructions: 1)  Please go to the basement for lab work. 2)   Begin Lialda 2 tabd daily. 3)  The medication list was reviewed and reconciled.  All changed / newly prescribed medications were explained.  A complete medication list was provided to the patient / caregiver. 4)  Copy sent to : Dr[facility]ona [facility]ene at [facility] Department of urology and Dr. Garnet Koyanagi and primary care 5)  Please continue current medications.  Prescriptions: LIALDA 1.2 GM TBEC (MESALAMINE) Take 2 tabs q am  #60 x 6   Entered by:   Alberteen Spindle RN   Authorized by:   Sable Feil MD [facility]   Signed by:   Alberteen Spindle RN on 03/05/2010   Method used:   Electronically to        [id number] (retail)       [address]       [place], [place]  [address]       Ph: [phone] or [phone]       Fax: [phone]   RxID:   [id number] LIALDA 1.2 GM TBEC (MESALAMINE) Take 2 tabs q am  #20 x 2   Entered by:   Alberteen Spindle RN   Authorized by:   Sable Feil MD [facility]   Signed by:   Alberteen Spindle RN  on 03/05/2010   Method used:   Print then Give to Patient   RxID:   [id number]

## 2010-10-16 NOTE — Progress Notes (Signed)
Summary: refill  Phone Note Refill Request Message from:  Fax from Pharmacy on April 12, 2010 11:14 AM  Refills Requested: Medication #1:  ALPRAZOLAM 0.5 MG TABS tAKE ONE TO 3 TABLETS DAILY AS NEEDED. [facility] [phone]  Initial call taken by: Arbie [place],  April 12, 2010 11:16 AM  Follow-up for Phone Call        last filled 03-16-10 #30, last ov 06-29-09...Marland KitchenMarland KitchenFelecia Deloach CMA  April 12, 2010 2:21 PM   Additional Follow-up for Phone Call Additional follow up Details #1::        refill x1 Additional Follow-up by: Garnet Koyanagi DO,  April 12, 2010 2:37 PM    Prescriptions: ALPRAZOLAM 0.5 MG TABS (ALPRAZOLAM) tAKE ONE TO 3 TABLETS DAILY AS NEEDED.  #30 x 0   Entered by:   Ernestene Mention CMA   Authorized by:   Garnet Koyanagi DO   Signed by:   Ernestene Mention CMA on 04/12/2010   Method used:   Telephoned to ...       [id number] (retail)       [address]       [place], [place]  [address]       Ph: [phone] or [phone]       Fax: [phone]   RxID:   [id number]

## 2010-10-16 NOTE — Progress Notes (Signed)
Summary: diarrhea   Phone Note Call from Patient Call back at Home Phone [phone]   Caller: Patient Summary of Call: patient left msg on voicemail stating that she has had salmonella 5 times in her lifetime. Wondering if she may have food poisoning has had an episode of vomiting and constant diarrhea.   Follow-up for Phone Call        spoke w/ patient having diarrhea xthurs. along w/ one episode of vomiting says she has been waiting things out to see if they are going to get better but it's not getting there also having some stomach and joint pains along w/ weakness everything she eats comes right back out. has been doing bland diet but no help. Scheduled office visit for tomorrow w/ Dr. Etter Sjogren at 11:15am informed to try some immodium to help w/ diarrhea but still continue bland diet..........Marland KitchenMalachi Bonds  February 26, 2010 4:19 PM

## 2010-10-16 NOTE — Letter (Signed)
Summary: [place]   Imported By: Edmonia James 12/01/2009 10:14:38  _____________________________________________________________________  External Attachment:    Type:   Image     Comment:   External Document

## 2010-10-16 NOTE — Letter (Signed)
Summary: Sheila [name]   Imported By: Edmonia James 09/21/2009 09:23:04  _____________________________________________________________________  External Attachment:    Type:   Image     Comment:   External Document

## 2010-10-16 NOTE — Progress Notes (Signed)
Summary: ALPRAZOLAM REFILL  Phone Note Refill Request Message from:  Fax from Pharmacy on July 12, 2010 2:04 PM  Refills Requested: Medication #1:  ALPRAZOLAM 0.5 MG TABS tAKE ONE TO 3 TABLETS DAILY AS NEEDED.   Last Refilled: 05/28/2010 [facility], [place],  [place]  [phone]  Initial call taken by: Berneta Sages,  July 12, 2010 2:06 PM  Follow-up for Phone Call        last ov 06/29/2009. Follow-up by: Allyn Kenner CMA,  July 12, 2010 2:53 PM  Additional Follow-up for Phone Call Additional follow up Details #1::        refill x1 Additional Follow-up by: Garnet Koyanagi DO,  July 12, 2010 2:59 PM    Prescriptions: ALPRAZOLAM 0.5 MG TABS (ALPRAZOLAM) tAKE ONE TO 3 TABLETS DAILY AS NEEDED.  #90 x 0   Entered by:   Aron Baba CMA ([place])   Authorized by:   Garnet Koyanagi DO   Signed by:   Aron Baba CMA ([place]) on 07/12/2010   Method used:   Printed then faxed to ...       [id number] (retail)       [address]       [place], [place]  [address]       Ph: [phone] or [phone]       Fax: [phone]   RxID:   [id number]

## 2010-10-16 NOTE — Assessment & Plan Note (Signed)
Summary: TETNUS SHOT///SPH   Nurse Visit   Allergies: 1)  ! Pcn 2)  ! Ampicillin 3)  ! Fentanyl Citrate (Fentanyl Citrate)  Immunizations Administered:  Tetanus Vaccine:    Vaccine Type: Tdap    Site: right deltoid    Mfr: Merck    Dose: 0.5 ml    Route: IM    Given by: Aron Baba CMA ([place])    Exp. Date: 06/06/2012    Lot #: [id number]    VIS given: 08/03/08 version given June 21, 2010.  Orders Added: 1)  Tdap => 56yr IM [[70340]2)  Admin 1st Vaccine [[35248]

## 2010-10-18 NOTE — Progress Notes (Signed)
Summary: Refill Request  Phone Note Refill Request Call back at [phone] Message from:  Pharmacy on October 08, 2010 9:48 AM  Refills Requested: Medication #1:  ALPRAZOLAM 0.5 MG TABS tAKE ONE TO 3 TABLETS DAILY AS NEEDED.   Dosage confirmed as above?Dosage Confirmed   Supply Requested: 90   Last Refilled: 08/22/2010 [facility] on [address].   Next Appointment Scheduled: none Initial call taken by: Elna Breslow,  October 08, 2010 9:48 AM    Prescriptions: ALPRAZOLAM 0.5 MG TABS (ALPRAZOLAM) tAKE ONE TO 3 TABLETS DAILY AS NEEDED.  #90 x 0   Entered by:   Aron Baba CMA ([place])   Authorized by:   Garnet Koyanagi DO   Signed by:   Aron Baba CMA ([place]) on 10/08/2010   Method used:   Printed then faxed to ...       [id number] (retail)       [address]       [place], [place]  [address]       Ph: [phone] or [phone]       Fax: [phone]   RxID:   [id number] ALPRAZOLAM 0.5 MG TABS (ALPRAZOLAM) tAKE ONE TO 3 TABLETS DAILY AS NEEDED.  #90 x 0   Entered by:   Aron Baba CMA ([place])   Authorized by:   Rolla Flatten CMA   Signed by:   Aron Baba CMA ([place]) on 10/08/2010   Method used:   Printed then faxed to ...       [id number] (retail)       [address]       [place], [place]  [address]       Ph: [phone] or [phone]       Fax: [phone]   RxID:   [id number]

## 2010-10-18 NOTE — Progress Notes (Signed)
Summary: Symtpoms back after ABX  Phone Note Call from Patient Call back at Work Phone [phone]   Caller: Patient Summary of Call: Patient called this morning stating that she is starting to feel worse now that she has finished her first 10 days of abx. She stated that she was told by Debbrah Alar (which she saw due to [place] [facility] [facility] being booked) if she that if her symtpoms restarted you would call in another round of abx or something different. She has exteme pressure under her eyes and in her head. She feels very "clogged" with mucos. She felt much better when on the abx and now all the symptoms have come back. Please advise due to Lenna Sciara being out of the office until the 16th.   Pharmacy: [facility] on [address].  Initial call taken by: Elna Breslow,  September 27, 2010 8:45 AM  Follow-up for Phone Call        avelox 400 #10 1 by mouth once daily ---if no better after that she will need to be seen Follow-up by: Garnet Koyanagi DO,  September 27, 2010 9:46 AM  Additional Follow-up for Phone Call Additional follow up Details #1::        pt aware.... Aron Baba CMA Deborra Medina)  September 27, 2010 11:58 AM     New/Updated Medications: AVELOX 400 MG TABS (MOXIFLOXACIN HCL) 1 by mouth once daily x10 days Prescriptions: AVELOX 400 MG TABS (MOXIFLOXACIN HCL) 1 by mouth once daily x10 days  #10 x 0   Entered by:   Aron Baba CMA ([employer])   Authorized by:   Garnet Koyanagi DO   Signed by:   Aron Baba CMA ([place]) on 09/27/2010   Method used:   Faxed to ...       [id number] (retail)       [address]       [place], [place]  [address]       Ph: [phone] or [phone]       Fax: [phone]   RxID:   [id number]

## 2010-10-18 NOTE — Assessment & Plan Note (Signed)
Summary: sinus infection//sore throat//lch--Rm 5   Vital Signs:  Patient profile:   45 year old female Height:      63.5 inches Weight:      128.25 pounds BMI:     22.44 O2 Sat:      98 % on Room air Temp:     98.1 degrees F oral Pulse rate:   90 / minute Pulse rhythm:   regular Resp:     16 per minute BP sitting:   114 / 78  (right arm) Cuff size:   regular  Vitals Entered By: Kelle Darting CMA Deborra Medina) (September 12, 2010 1:23 PM)  O2 Flow:  Room air CC: Pt states she has had intermittent sinus drainage, swollen glands in her neck and scratchy throat x 2 months. OTC meds have not helped. Is Patient Diabetic? No Pain Assessment Patient in pain? no      Comments Pt agrees all med doses and directions are correct.  Pt states she has stopped Lialda as she does not need at this time. Gilmore Laroche Fergerson CMA Deborra Medina)  September 12, 2010 1:32 PM    Primary Care Provider:  Garnet Koyanagi DO  CC:  Pt states she has had intermittent sinus drainage and swollen glands in her neck and scratchy throat x 2 months. OTC meds have not helped.Marland Kitchen  History of Present Illness: Ms.  Nodine is a 45 year old female who presents with CC of sinus drainage.  + Facial pressure, nasal discharge is greenish brown.   Symptoms started 2 months ago- though severity waxes and wanes.  Associated with scratchy throat, bodya aches, chills, though denies fever.  Swollen glands in neck. Tried multiple OTC prep without improvement.   Allergies: 1)  ! Pcn 2)  ! Ampicillin 3)  ! Fentanyl Citrate (Fentanyl Citrate)  Review of Systems       see HPI  Physical Exam  General:  Well-developed,well-nourished,in no acute distress; alert,appropriate and cooperative throughout examination Head:  Normocephalic and atraumatic without obvious abnormalities. No apparent alopecia or balding. Eyes:  PERRLA, sclear clear, EOM's intact Mouth:  Mild pharyngeal erythema without exudates Neck:  No deformities, masses, or  tenderness noted. Lungs:  Normal respiratory effort, chest expands symmetrically. Lungs are clear to auscultation, no crackles or wheezes. Heart:  Normal rate and regular rhythm. S1 and S2 normal without gallop, murmur, click, rub or other extra sounds. Psych:  Cognition and judgment appear intact. Alert and cooperative with normal attention span and concentration. No apparent delusions, illusions, hallucinations   Impression & Recommendations:  Problem # 1:  SINUSITIS - ACUTE-NOS (ICD-461.9) Assessment New Will treat with clarithromycin and fluticasone. Pt instructed to call for f/u as outlined in pt sign out sheet. Her updated medication list for this problem includes:    Clarithromycin 500 Mg Tabs (Clarithromycin) ..... One tablet by mouth two times a day for 10 day    Fluticasone Propionate 50 Mcg/act Susp (Fluticasone propionate) .Marland Kitchen... 2 sprays each nostril once daily  Complete Medication List: 1)  Alprazolam 0.5 Mg Tabs (Alprazolam) .... Take one to 3 tablets daily as needed. 2)  Wellbutrin Xl 150 Mg Xr24h-tab (Bupropion hcl) .... 3 by mouth once daily 3)  Lialda 1.2 Gm Tbec (Mesalamine) .... Take 2 tabs q am 4)  Clarithromycin 500 Mg Tabs (Clarithromycin) .... One tablet by mouth two times a day for 10 day 5)  Fluticasone Propionate 50 Mcg/act Susp (Fluticasone propionate) .... 2 sprays each nostril once daily  Patient Instructions: 1)  Call  if you develop fever over 101, increasing sinus pressure, pain with eye movement, increased facial tenderness of swelling, or if you develop visual changes. Prescriptions: FLUTICASONE PROPIONATE 50 MCG/ACT SUSP (FLUTICASONE PROPIONATE) 2 sprays each nostril once daily  #1 x 0   Entered and Authorized by:   Nance Pear FNP   Signed by:   Nance Pear FNP on 09/12/2010   Method used:   Electronically to        [id number] (retail)       [address]       [place], [place]  [address]        Ph: [phone] or [phone]       Fax: [phone]   RxID:   [id number] CLARITHROMYCIN 500 MG TABS (CLARITHROMYCIN) one tablet by mouth two times a day for 10 day  #20 x 0   Entered and Authorized by:   Nance Pear FNP   Signed by:   Nance Pear FNP on 09/12/2010   Method used:   Electronically to        [id number] (retail)       [address]       [place], [place]  [address]       Ph: [phone] or [phone]       Fax: [phone]   RxID:   [id number]    Orders Added: 1)  Est. Patient Level III [64353]    Current Allergies (reviewed today): ! PCN ! AMPICILLIN ! FENTANYL CITRATE (FENTANYL CITRATE)

## 2010-11-19 ENCOUNTER — Ambulatory Visit: Payer: Self-pay | Admitting: Family Medicine

## 2010-11-26 ENCOUNTER — Ambulatory Visit (INDEPENDENT_AMBULATORY_CARE_PROVIDER_SITE_OTHER): Payer: Federal, State, Local not specified - PPO | Admitting: Family Medicine

## 2010-11-26 ENCOUNTER — Encounter: Payer: Self-pay | Admitting: Family Medicine

## 2010-11-26 DIAGNOSIS — F411 Generalized anxiety disorder: Secondary | ICD-10-CM | POA: Insufficient documentation

## 2010-11-26 DIAGNOSIS — K5090 Crohn's disease, unspecified, without complications: Secondary | ICD-10-CM

## 2010-11-26 DIAGNOSIS — IMO0001 Reserved for inherently not codable concepts without codable children: Secondary | ICD-10-CM

## 2010-11-26 DIAGNOSIS — K5010 Crohn's disease of large intestine without complications: Secondary | ICD-10-CM

## 2010-12-04 NOTE — Assessment & Plan Note (Signed)
Summary: med check/cbs   Vital Signs:  Patient profile:   45 year old female Weight:      126.0 pounds Pulse rate:   92 / minute Pulse rhythm:   regular BP sitting:   122 / 78  (left arm) Cuff size:   regular  Vitals Entered By: Aron Baba CMA Deborra Medina) (November 26, 2010 1:52 PM) CC: f/u on meds   History of Present Illness: Pt here f/u anxiety.  She feels like it has gotten worse.  Pt is under a lot of stress with crohns and IC and it is affecting her marriage.  Pt is seeing Urology for IC and they are struggling to treat her.    Current Medications (verified): 1)  Alprazolam 0.5 Mg Tabs (Alprazolam) .... Take One To 3 Tablets Daily As Needed. 2)  Wellbutrin Xl 150 Mg Xr24h-Tab (Bupropion Hcl) .... 2 By Mouth Once Daily 3)  Fluticasone Propionate 50 Mcg/act Susp (Fluticasone Propionate) .... 2 Sprays Each Nostril Once Daily 4)  Sprintec 28 0.25-35 Mg-Mcg Tabs (Norgestimate-Eth Estradiol) 5)  Savella Titration Pack 12.5 & 25 & 50 Mg Misc (Milnacipran Hcl)  Allergies (verified): 1)  ! Pcn 2)  ! Ampicillin 3)  ! Fentanyl Citrate (Fentanyl Citrate)  Past History:  Past Medical History: Last updated: 08/04/2007 SLE Crohn's  Fibromyalgia ICS  Past Surgical History: Last updated: 08/19/2008 C- Section 2006 T & A Left knee arthroscopy Left wrist surgery x 2 (ganglion cyst removal) right fallopian tube removed bladder surgery (for interstitial cystitis)  Family History: Last updated: 08/19/2008 Family History of Breast Cancer:Maternal Grandmother, Maternal Great Aunts, Cousin Family History of Colitis/Crohn's: self Family History of Prostate Cancer: grandfather No FH of Colon Cancer:  Social History: Last updated: 03/05/2010 Married, 2 boys Patient has never smoked.  Alcohol Use - yes 1 a week Daily Caffeine Use  1/2 cup coffee in the AM Illicit Drug Use - no Patient gets regular exercise.  Risk Factors: Exercise: yes (08/19/2008)  Risk Factors: Smoking  Status: never (08/05/2008)  Family History: Reviewed history from 08/19/2008 and no changes required. Family History of Breast Cancer:Maternal Grandmother, Maternal Great Aunts, Cousin Family History of Colitis/Crohn's: self Family History of Prostate Cancer: grandfather No FH of Colon Cancer:  Social History: Reviewed history from 03/05/2010 and no changes required. Married, 2 boys Patient has never smoked.  Alcohol Use - yes 1 a week Daily Caffeine Use  1/2 cup coffee in the AM Illicit Drug Use - no Patient gets regular exercise.  Review of Systems      See HPI  Physical Exam  General:  Well-developed,well-nourished,in no acute distress; alert,appropriate and cooperative throughout examination Heart:  Normal rate and regular rhythm. S1 and S2 normal without gallop, murmur, click, rub or other extra sounds. Cervical Nodes:  No lymphadenopathy noted Psych:  Oriented X3, normally interactive, good eye contact, tearful, and severely anxious.     Impression & Recommendations:  Problem # 1:  ANXIETY STATE, UNSPECIFIED (ICD-300.00)  Her updated medication list for this problem includes:    Alprazolam 0.5 Mg Tabs (Alprazolam) .Marland Kitchen... Take one to 3 tablets daily as needed.    Wellbutrin Xl 150 Mg Xr24h-tab (Bupropion hcl) .Marland Kitchen... 2 by mouth once daily  Discussed medication use and relaxation techniques.   Problem # 2:  FIBROMYALGIA (ICD-729.1) start [place] if ok with Urology  Problem # 3:  CROHN'S DISEASE (ICD-555.9) per GI  Problem # 4:  CHRONIC INTERSTITIAL CYSTITIS (ICD-595.1) per urology  Complete Medication List: 1)  Alprazolam  0.5 Mg Tabs (Alprazolam) .... Take one to 3 tablets daily as needed. 2)  Wellbutrin Xl 150 Mg Xr24h-tab (Bupropion hcl) .... 2 by mouth once daily 3)  Fluticasone Propionate 50 Mcg/act Susp (Fluticasone propionate) .... 2 sprays each nostril once daily 4)  Sprintec 28 0.25-35 Mg-mcg Tabs (Norgestimate-eth estradiol) 5)  Savella Titration Pack  12.5 & 25 & 50 Mg Misc (Milnacipran hcl)  Patient Instructions: 1)  Please schedule a follow-up appointment in 1 month.  Prescriptions: ALPRAZOLAM 0.5 MG TABS (ALPRAZOLAM) tAKE ONE TO 3 TABLETS DAILY AS NEEDED.  #90 x 0   Entered and Authorized by:   Garnet Koyanagi DO   Signed by:   Garnet Koyanagi DO on 11/26/2010   Method used:   Print then Give to Patient   RxID:   [id number] WELLBUTRIN XL 150 MG XR24H-TAB (BUPROPION HCL) 2 by mouth once daily  #60 x 5   Entered and Authorized by:   Garnet Koyanagi DO   Signed by:   Garnet Koyanagi DO on 11/26/2010   Method used:   Electronically to        [id number] (retail)       [address]       [place], [place]  [address]       Ph: [phone] or [phone]       Fax: [phone]   RxID:   [id number]    Orders Added: 1)  Est. Patient Level IV [93112]

## 2010-12-27 ENCOUNTER — Telehealth: Payer: Self-pay | Admitting: *Deleted

## 2010-12-27 MED ORDER — MILNACIPRAN HCL 50 MG PO TABS: 1.0000 | ORAL_TABLET | Freq: Two times a day (BID) | ORAL | Status: DC

## 2010-12-27 NOTE — Telephone Encounter (Signed)
savella 50 mg 1 po bid #60  5 refills Glad its helping!!

## 2010-12-27 NOTE — Telephone Encounter (Signed)
Pt states that she checked with her urologist Dr Ellard Artis about taking the [facility]. Pt has since started Savella Titration Pack 12.5 & 25 & 50 Mg Misc (Milnacipran hcl) and now need Rx sent to pharmacy because she will be out on Saturday. Pt using [facility]. Please advise   Per last OV 11-26-10 Pt to schedule a follow-up appointment in 1 month. Pt advise of this and will call later to schedule appt.

## 2010-12-27 NOTE — Telephone Encounter (Signed)
Pt aware Rx sent to the pharmacy     KP

## 2011-01-29 NOTE — Discharge Summary (Signed)
[name], [name]               ACCOUNT NO.:  [id number]   MEDICAL RECORD NO.:  [id number]          PATIENT TYPE:  INP   LOCATION:  6808                         FACILITY:  [facility]   PHYSICIAN:  Sheila [name], MDDATE OF BIRTH:  [birth year]   DATE OF ADMISSION:  07/29/2007  DATE OF DISCHARGE:  07/30/2007                               DISCHARGE SUMMARY   DISCHARGE DIAGNOSES:  1. Atypical chest pain in setting of acute viral illness.  2. History of lupus.  3. Crohn's disease currently at baseline.  4. Fibromyalgia currently at baseline.   HISTORY OF PRESENT ILLNESS:  Ms. Sheila [name] is a 45 year old female who was  admitted on July 29, 2007 with chief complaint of chest pain.  She  presented to the emergency department noting chest pain which started  suddenly at 3 a.m. on the morning of admission and awakened her from her  sleep.  She felt as though a vise grip was pressing the middle of her  chest.  This pain radiated to her back.  She noted that it was worse  with inspiration.  She was given sublingual nitro and IV morphine and  noted improvement to a dull pain.  She also noted complaints of feeling  achy all over as well as headache.  She did have positive sick contacts  as both of her young sons were sick with a viral illness and fever last  week.  She was admitted for further evaluation and treatment.   PAST MEDICAL HISTORY:  1. Crohn's disease.  2. Systemic lupus erythematosus.  3. Fibromyalgia.  4. Interstitial cystitis.  5. Genital herpes.   COURSE OF HOSPITALIZATION:  1. Atypical chest pain in setting of acute viral illness.  The patient      was admitted and underwent a chest x-ray which showed stable      prominent interstitial markings without acute abnormality.  A D-      dimer was performed which was within normal limits.  EKG noted      normal sinus rhythm with a slightly prolonged QT.  There were      nonspecific T wave  abnormalities.  She underwent serial  cardiac      enzymes which were negative x3.  She was noted to be febrile during      this admission with maximum temperature recorded at 101.5.  Her      white count remained normal, and the patient did undergo urine      culture and blood culture which are currently pending at time of      this dictation.  She was started empirically on oral doxycycline      which will continue at time of discharge pending final culture      data.  The patient continues with myalgias though overall feels      somewhat better than yesterday and reported that her chest pain was      improved.  At this time plan is to discharge the patient to home      with close outpatient follow-up.  She was instructed to return to  the ER should she develop worsening weakness or chest pain.  Also      of note she did have a 2-D echo performed during this admission.      There was concern about pericarditis in the setting of the      patient's history of lupus.  There was no effusion, pericardial      effusion noted on left ventricular echo.  She did have a normal      ejection fraction, and there was no signs of cardiac embolus.   DISCHARGE MEDICATIONS:  1. Doxycycline 100 mg p.o. b.i.d. x6 days.  2. Vicodin 5/325 one tab p.o. q.4 h. p.r.n. pain or headache.  3. Zofran 4 mg p.o. q.8 h. p.r.n.   LABORATORY DATA:  Pertinent laboratories at time of discharge:  Hemoglobin 11.7, hematocrit 34.4, white blood cell count 5.6.  BUN 8,  creatinine 0.7, sodium 134, potassium 3.8.   DISPOSITION:  The patient was discharged to home.   FOLLOW UP:  The patient is to follow up with Dr. Garnet [name] on  November 18 at 11:15 a.m.   PRIMARY CARE PHYSICIAN:  Dr. Garnet [name].      Debbrah Alar, NP      Jannifer Rodney. Asa Lente, MD  Electronically Signed    MO/MEDQ  D:  07/30/2007  T:  07/31/2007  Job:  [id number]   cc:   Sheila Chessman, DO

## 2011-02-01 NOTE — Op Note (Signed)
Sheila [name], Sheila [name]                         ACCOUNT NO.:  [id number]   MEDICAL RECORD NO.:  [id number]                   PATIENT TYPE:  INP   LOCATION:  9144                                 FACILITY:  [facility]   PHYSICIAN:  Ralene Bathe. Matthew Saras, M.D.            DATE OF BIRTH:  1966-04-18   DATE OF PROCEDURE:  05/09/2002  DATE OF DISCHARGE:                                 OPERATIVE REPORT   PREOPERATIVE DIAGNOSIS:  1. Acute right lower quadrant pain.  2. Right ovarian cyst.   POSTOPERATIVE DIAGNOSES:  1. Torsion of right fallopian tube.  2. Left peritubal cyst.   PROCEDURE:   SURGEON:  Ralene Bathe. Matthew Saras, M.D.   ESTIMATED BLOOD LOSS:  100 cc.   INDICATIONS:  The patient has been hospitalized for 1-1/2 days with severe  right lower quadrant pain.  This has not responded to IV narcotics.  Initial  evaluation showed a normal WBC which has continued to remain normal.  She  has been afebrile until last night when she had a temperature maximum of  100.5.  This morning she was still complaining of severe right lower  quadrant pain despite IV narcotics.  Urine and serum pregnancy tests were  negative.  Ultrasound and CT on admission showed right ovarian cyst, normal  Doppler flow into the right ovary.  The paracecal area was unremarkable with  a 4 x 5 x 2.8 x 4.1 cm cyst on the right ovary.  No free fluid.  Preoperatively, she was informed about laparoscopy and the possible need for  laparotomy.  The risks of the surgery including bleeding, infection,  transfusion, adjacent organ injury were reviewed with her.  The possible  impact on her fertility were reviewed also which she understood and  accepted.   DESCRIPTION OF PROCEDURE:  The patient was taken to the operating room.  After an adequate level of general endotracheal anesthesia was obtained with  the patient's legs in stirrups, the abdomen, perineum and vagina were  prepped and draped in the usual manner for laparoscopy.  The  bladder was  drained.  Examination under anesthesia was carried out.  The uterus was mid  anterior, normal size.  Adnexae negative.  A Kahn cannula was positioned.  Attention was directed to the abdomen where a 2 cm subumbilical incision was  made.  A Veress needle was introduced without difficulty.  Its intra-  abdominal position was verified by pressure and water testing.  After a 2  liter pneumoperitoneum was then created, a laparoscopic trocar and sleeve  were introduced.  There was no evidence of any bleeding or trauma.  Two  fingerbreadths below the symphysis in the midline, a second puncture was  made under direct visualization.  With the patient in Trendelenburg, and the  uterus anteflexed, the pelvic findings were as follows.  The left tube and  ovary were unremarkable except for a peritubular cyst approximately 2.5-3  cm.  There was no blood or free fluid in the pelvis except for a scant  amount which was sent for gram stain and culture.  Significant findings  involved the right tube.  I could see the proximal 4 cm of tube and then it  disappeared into a torsion mass that was purplish and extremely enlarged  approximately 6-7 cm in length and 4 cm across.  The ovary itself had a  small follicular thin wall cyst but was otherwise normal.  The cecum and  appendix could be visualized easily and appeared to be normal.  Because of  the size of the torsed ovary and the difficulty with visualization, decision  was made to proceed with laparotomy.  The Foley catheter was positioned.  Her legs were placed down.  A small Pfannenstiel incision was made and  carried down to the fascia.  The rectus muscles were divided in the midline.  The fascia had been incised transversely.  The perineum was entered without  difficulty.  The O'Connor-O'Sullivan retractor was positioned.  The bowel  was packed superiorly out of the field.  Irrigation was carried out.  Elevation of the torsion mass revealed  that in fact that again you could see  the proximal 4 cm of tube which then disappeared into the mass.  The ovary  was free and clear.  It was hard to tell if there was a paratubal cyst on  that side that had instigated this event.  This was wrapped in a tight  spiral which was not undone.  This was clamped at the base and first free  tied followed by a suture ligature of Dexon.  The small cyst on the right  ovary was drained after coagulation superficially with the Bovie, clear  fluid only.  The right ovary was otherwise was conserved.  The left tube and  ovary were completely normal except for a peritubular cyst two-thirds out of  the length of the tube.  This was coagulated superficially and drained, and  a small portion of the cyst wall was removed to try to ensure that this  fluid does not recollect.  This was hemostatic.  The operative site on the  right was then carefully observed and was hemostatic.  No other  abnormalities were noted.  The pelvis was irrigated with saline and  aspirated with good hemostasis being present.  Prior to closure, sponge,  needle and instrument counts were reported correct x2.  Rectus muscles were  closed with 2-0 Dexon interrupted sutures.  The fascia was closed with from  laterally to midline using 2-0 Dexon running sutures.  Subcutaneous fat was  hemostatic.  Clips and Steri-Strips were used on the skin.  The incision was  infiltrated locally with 5% Marcaine plain.  She tolerated this well and  went to the recovery room in good condition.                                               Richard M. Matthew Saras, M.D.    RMH/MEDQ  D:  05/09/2002  T:  05/10/2002  Job:  [id number]

## 2011-02-01 NOTE — Op Note (Signed)
[name], Sheila [name]               ACCOUNT NO.:  [id number]   MEDICAL RECORD NO.:  [id number]          PATIENT TYPE:  INP   LOCATION:                                FACILITY:  [facility]   PHYSICIAN:  Darlyn Chamber, M.D.   DATE OF BIRTH:  [birth year]   DATE OF PROCEDURE:  04/01/2005  DATE OF DISCHARGE:                                 OPERATIVE REPORT   PREOPERATIVE DIAGNOSIS:  Uterine pregnancy at 38+ weeks, desires primary  cesarean section.   POSTOPERATIVE DIAGNOSIS:  Uterine pregnancy at 38+ weeks, desires primary  cesarean section.   OPERATION PERFORMED:  Low transverse cesarean section.   SURGEON:  Darlyn Chamber, M.D.   ANESTHESIA:  Spinal.   ESTIMATED BLOOD LOSS:  400 mL.   PACKS AND DRAINS:  None.   INTRAOPERATIVE BLOOD REPLACED:  None.   COMPLICATIONS:  None.   INDICATIONS FOR PROCEDURE:  Dictated in history and physical.   DESCRIPTION OF PROCEDURE:  The patient was taken to the operating room and  placed in supine position with left lateral tilt.  Satisfactory level of  spinal anesthesia was obtained.  The abdomen was prepped out with Betadine  and draped in a sterile field.  Some discomfort of the skin was still  encountered and brought under control with 0.25% Marcaine.  Low transverse  skin incision was excised. The incision was then extended through  subcutaneous tissue.  Fascia was entered sharply and incision in the fascia  extended laterally.  FAscia was taken off the muscle superiorly inferiorly.  Rectus muscles were separated in the midline.  The peritoneum was entered  sharply and incision in the peritoneum extended both superiorly and  inferiorly.  Some omental adhesions were encountered and pushed out of the  way.  A low transverse bladder flap was developed.  A low transverse uterine  incision was begun with a knife, extended laterally using manual traction.  Amniotic fluid was clear.  The infant presented in the vertex presentation,  was delivered with  a vacuum extractor and fundal pressure. The infant was a  viable female who weighed 6 pounds and 14 ounces.  Apgars were 9/9, umbilical  cord pH was 8.85.  Placenta was then delivered manually.  The uterus was  then wiped free of remaining membranes and placenta.  The uterus was then  closed with a running locking suture of 0 chromic using a two layered  closure technique.  We had good hemostasis and clear urine output.  We  irrigated the pelvis.  The omental adhesions were taken down using the  Bovie.  We got good hemostasis.  The left tube and ovary were normal.  The  right tube was surgically absent, right ovary appeared to be normal.  At  this point the muscles in the peritoneum were closed with running suture of  3-0 Vicryl.  Fascia closed with a running  suture of PDS.  Skin was closed with staples and Steri-Strips. Sponge,  instrument and needle counts were reported correct by the circulating nurse  times two.  Foley catheter remained clear at time of  closure.  The patient  tolerated the procedure well and was returned to recovery in good condition.       JSM/MEDQ  D:  04/01/2005  T:  04/01/2005  Job:  [id number]

## 2011-02-01 NOTE — H&P (Signed)
Sheila [name], Sheila [name]                         ACCOUNT NO.:  [id number]   MEDICAL RECORD NO.:  [id number]                   PATIENT TYPE:  OBV   LOCATION:  9306                                 FACILITY:  [facility]   PHYSICIAN:  Ralene Bathe. Matthew Saras, M.D.            DATE OF BIRTH:  05-09-1966   DATE OF ADMISSION:  05/08/2002  DATE OF DISCHARGE:                                HISTORY & PHYSICAL   HISTORY OF PRESENT ILLNESS:  This patient is a 45 year old G0 P0, LMP was  eighteen days ago.  She is not currently using anything for contraception.  She does have a history of primary infertility.  This patient has a 24-hour  history of severe right lower quadrant pain with gradual onset associated  with some nausea and vomiting.  No hematuria or vaginal bleeding.  She was  seen in the [facility] ED yesterday where UPT was negative, WBC was normal,  UA was negative, and she was afebrile.  Pelvic ultrasound and CT were  performed there.  CT showed no hydronephrosis.  The gallbladder was  unremarkable.  There was no evidence of any renal calculi.  There were  multiple small cystic lesions seen in the right and left adnexa.  The  appendix was not identified but there was no inflammatory change in the  right lower quadrant or the pericecal area.  The radiologist's report was  possible bilateral hydrosalpinx or tuboovarian abscess.  They had ordered  pelvic ultrasound and were waiting to get that report.  She is transferred  to [facility] primarily for pain management and further evaluation.   ALLERGIES:  The patient is allergic to PENICILLIN and PERCOCET.   CURRENT MEDICATIONS:  Wellbutrin, Vioxx, Elmiron, Darvocet, and Elavil.   REVIEW OF SYSTEMS:  Significant for IC and primary infertility.   OPERATIONS:  She had a laparoscopy six years ago to evaluate part of her  pelvic pain.  She states the pelvic findings were normal at that time.  She  has also had bladder hydrodistention.   PHYSICAL  EXAMINATION:  VITAL SIGNS:  Temperature 98.0, blood pressure  112/68, pulse 68.  HEENT:  Unremarkable.  NECK:  Supple without masses.  LUNGS:  Clear.  CARDIOVASCULAR:  Regular rate and rhythm without murmurs, rubs, or gallops  noted.  BREASTS:  Not examined.  ABDOMEN:  Soft, flat, positive bowel sounds.  Significantly tender in the  right lower quadrant.  No significant rebound.  PELVIC:  Per the ED physician demonstrated significant pelvic pain, no  unusual cervical discharge.  Cultures were obtained but are still pending.   IMPRESSION:  Acute onset right lower quadrant and mid pelvic pain.  No  evidence of stone, doubt appendicitis with a normal white count.  CT  findings are consistent with a ruptured ovarian cyst.  I doubt acute pelvic  inflammatory disease based on these findings but certainly need to check the  culture report  and may need to cover  with antibiotics in the interim.  Discussed with the patient and her family  today that if her pain does not improve even with a normal white count and  the fact that she is afebrile, we may need to consider laparoscopy, which  was reviewed in detail today.                                               Richard M. Matthew Saras, M.D.    RMH/MEDQ  D:  05/08/2002  T:  05/08/2002  Job:  [id number]

## 2011-02-01 NOTE — Discharge Summary (Signed)
NAMEMILANI, LOWENSTEIN               ACCOUNT NO.:  [id number]   MEDICAL RECORD NO.:  [id number]          PATIENT TYPE:  INP   LOCATION:  9120                          FACILITY:  [facility]   PHYSICIAN:  Monia Sabal. Corinna Capra, M.D.    DATE OF BIRTH:  08/02/1966   DATE OF ADMISSION:  04/01/2005  DATE OF DISCHARGE:  04/04/2005                                 DISCHARGE SUMMARY   ADMITTING DIAGNOSIS:  1.  Intrauterine pregnancy at term  2.  Multiple medical problems including interstitial cystitis and      fibromyalgia.   DISCHARGE DIAGNOSIS:  1.  Status post low transverse cesarean section  2.  Viable female infant.   PROCEDURE:  Primary low transverse cesarean section.   REASON FOR ADMISSION:  Please see dictated H&P.   HOSPITAL COURSE:  The patient was a 45 year old primigravida white married  female that was admitted to [facility] for scheduled  cesarean section. The patient had multiple medical problems including  history of Crohn's, lupus erythematosus, fibromyalgia and she was currently  being actively managed for interstitial cystitis. Due to a constrained  discomfort related to interstitial cystitis and fibromyalgia the patient was  admitted for primary cesarean delivery. On the morning of admission the  patient was taken to the operating room where spinal anesthesia was  administered without difficulty. A low transverse incision was made to  deliver a viable female infant weighing 6 pounds 14 ounces with Apgars of 9 at  1 and 9 at 5 minutes. Umbilical artery pH was 1.94. The patient tolerated  procedure well and taken to the recovery room in stable condition. On  postoperative day #1 the patient was without complaint. Vital signs were  stable. She was afebrile. Abdomen soft with good return of bowel function.  Fundus was firm and nontender. Abdominal dressing was noted to be clean, dry  and intact. Laboratory findings revealed hemoglobin of 8.9, platelet count  167,000 and WBC  count of 9.2. Postoperative day #2 the patient was without  complaint other than gas. Vital signs stable. She was afebrile. Abdomen  soft with good return of bowel function. Fundus firm and nontender. Incision  was clean, dry and intact. She is ambulating well. Warm beverages were  offered and Dulcolax suppository was given. On postoperative day #3 the  patient was without complaint. She had a good response from the Dulcolax  suppository. Vital signs stable. She was afebrile. Fundus was firm and  nontender. Incision was clean, dry and intact. Staples removed. The  patient's later discharged home.   CONDITION ON DISCHARGE:  Good.   DIET:  Regular as tolerated.   ACTIVITY:  No heavy lifting, no driving x2 weeks, no vaginal entry.   FOLLOW-UP:  The patient to follow up in the office in 1-2 weeks for incision  check. She is to call for temperature greater than 100 degrees, persistent  nausea and vomiting, heavy vaginal bleeding and/or redness or drainage from  incisional site.   DISCHARGE MEDICATIONS:  1.  Tylox #30 one p.o. every 4 to 6 hours p.r.n.  2.  Motrin 600  mg every 6 hours.  3.  Prenatal vitamins one p.o. daily.  4.  Colace one p.o. daily p.r.n.      Juanda Chance, N.P.      Monia Sabal Corinna Capra, M.D.  Electronically Signed    CC/MEDQ  D:  05/12/2005  T:  05/13/2005  Job:  [id number]

## 2011-02-01 NOTE — H&P (Signed)
Sheila [name], Sheila [name]               ACCOUNT NO.:  [id number]   MEDICAL RECORD NO.:  [id number]          PATIENT TYPE:  INP   LOCATION:  9199                          FACILITY:  [facility]   PHYSICIAN:  Darlyn Chamber, M.D.   DATE OF BIRTH:  12/05/1965   DATE OF ADMISSION:  04/01/2005  DATE OF DISCHARGE:                                HISTORY & PHYSICAL   Patient is a 45 year old gravida 72, para 0 married white female given her  last menstrual period October 25 an estimated date of confinement of August  1.  This gives her an estimated gestational age of [redacted] weeks.   Her prenatal course has been complicated by the fact that she has a long-  standing history of infertility.  She subsequently did conceive on her own.  She also has a history of Crohn's.  She has a history of lupus  erythematosus.  She has a history of fibromyalgia and is being actively  managed for interstitial cystitis.   Patient was also at risk for advanced maternal age.  She subsequently had a  first trimester ultrasound done at [facility] with negative findings.  However,  the blood work did indicate an increased risk of Down syndrome of 1 in 43.  She subsequently had a second trimester ultrasound at the [facility] with negative findings.  She states she would not terminate the  pregnancy and therefore did forego any further evaluation.   Does have a history of genital herpes.  She has been on Valtrex since 34  weeks.   The patient has had extreme discomfort related to interstitial cystitis and  fibromyalgia.  We did an amniocentesis about two weeks ago with immature  studies, therefore she presents now at 66 weeks to undergo a primary  cesarean section per her request.  She does understand the potential risks  of cesarean section over vaginal deliveries, however, in view of the herpes  and the constant pain she wishes to proceed with the cesarean section.   ALLERGIES:  PENICILLIN, AMPICILLIN, FENTANYL, and  PERCOCET.   MEDICATIONS:  Prenatal vitamins.   PAST MEDICAL HISTORY:  Please see prenatal records.   FAMILY HISTORY:  Please see prenatal records.   SOCIAL HISTORY:  Please see prenatal records.   REVIEW OF SYSTEMS:  Noncontributory.   PHYSICAL EXAMINATION:  VITAL SIGNS:  Patient is afebrile with stable vital  signs.  HEENT:  Patient normocephalic.  Pupils are equal, round, and reactive to  light and accommodation.  Extraocular movements were intact.  Sclerae and  conjunctivae are clear.  Oropharynx clear.  NECK:  Without thyromegaly.  BREASTS:  No discreet masses.  LUNGS:  Clear.  CARDIOVASCULAR:  Regular rate and rhythm without murmurs or gallops.  ABDOMEN:  Gravid uterus consistent with dates.  PELVIC:  Cervix is long and closed.  EXTREMITIES:  Trace edema.  NEUROLOGIC:  Grossly within normal limits.   IMPRESSION:  1.  Intrauterine pregnancy at term with desires of primary cesarean section.  2.  Advanced maternal age.  3.  Genital herpes.  4.  Interstitial cystitis.  5.  Fibromyalgia.  6.  Lupus erythematosus.  7.  Crohn's disease.   PLAN:  The patient will undergo primary cesarean section.  The risks were  discussed including the risk of infection, risk of hemorrhage that could  require transfusion with the risk of AIDS or hepatitis, risk of injury to  adjacent organs including bladder, bowel, or ureters that could require  further exploratory surgery, risk of deep venous thrombosis and pulmonary  embolus.  The patient expressed understanding of indications and risks.       JSM/MEDQ  D:  04/01/2005  T:  04/01/2005  Job:  [id number]

## 2011-02-01 NOTE — Discharge Summary (Signed)
   [name], Sheila [name]                         ACCOUNT NO.:  [id number]   MEDICAL RECORD NO.:  [id number]                   PATIENT TYPE:  INP   LOCATION:  9144                                 FACILITY:  [facility]   PHYSICIAN:  Darlyn Chamber, M.D.                DATE OF BIRTH:  September 12, 1966   DATE OF ADMISSION:  05/08/2002  DATE OF DISCHARGE:  05/12/2002                                 DISCHARGE SUMMARY   ADMISSION DIAGNOSIS:  Pelvic pain.   DISCHARGE DIAGNOSES:  Torsed right fallopian tube.   OPERATIVE PROCEDURE:  Laparoscopy with subsequent exploratory laparotomy and  removal of right fallopian tube.   For complete history and physical, please see the dictated note.   COURSE IN THE HOSPITAL:  The patient was admitted with worsening right lower  quadrant pain.  After observation and evaluation the patient underwent  laparoscopy with finding of a torsion of the right fallopian tube.  Through  a mini laparotomy, this was removed.  Pathology is stills pending.  Postoperatively did well.  Discharged home on postoperative day #3.  At that  time she was afebrile with stable vital signs.  Abdomen was soft and  nontender.  Bowel sounds were active.  The low transverse incision was  intact, staples removed.  She was voiding without difficulty.   SURGICAL COMPLICATIONS:  None encountered during her stay in the hospital.   CONDITION:  The patient was discharged in stable condition.   DISPOSITION:  Routine postoperative instructions were given.  She was to  avoid heavy lifting.  She was also to avoid driving a car.  She will watch  for signs of infection, nausea or vomiting, increase in abdominal pain.  Discharged on Demerol as needed for pain.  Follow up in the office in one  week.                                               Darlyn Chamber, M.D.    JSM/MEDQ  D:  05/12/2002  T:  05/13/2002  Job:  [id number]

## 2011-02-01 NOTE — Op Note (Signed)
[place]  Patient:    Sheila [name], Sheila [name] Visit Number: [id number] MRN: [id number]          Service Type: DSU Location: [facility] Attending Physician:  Sheila [name] Dictated by:   Sheila [name] Sheila [name]., M.D. Proc. Date: 02/16/02 Admit Date:  02/16/2002                             Operative Report  PREOPERATIVE DIAGNOSIS:  Enlarging mass, dorsal aspect of left wrist.  POSTOPERATIVE DIAGNOSIS:  Enlarging mass, dorsal aspect of left wrist.  OPERATION PERFORMED:  Excision of mass, dorsal aspect of left wrist consistent with a capsular ganglion/myxoma.  SURGEON:  Sheila [name]. [name], Sheila Bonito., M.D.  ASSISTANT:  None.  ANESTHESIA:  General by LMA.  SUPERVISING ANESTHESIOLOGIST:  Dr. Linna [name].  INDICATIONS FOR PROCEDURE:  The patient is a 45 year old well-healed dominant woman who has developed an enlarging mass on the dorsal aspect of her left wrist.  She had had a previous presumed ganglion that had involuted.  She has now developed during the past months, an enlarging mass on the dorsal aspect of her left wrist that was causing increasing pain.  She requested excision for diagnosis and hopeful resolution of her pain predicament.  Preoperatively, both Sheila [name] and her husband were informed that the etiology of these lesions is poorly understood.  Despite a thorough resection and debridement of the dorsal capsule as described in textbooks, these can recur on a regular basis.  Her pain syndrome was thought to be due to compression of the posterior interosseous nerve.  We advised the patient preoperatively that she might benefit from resection of the entire posterior interosseous or some of its branches to control her pain syndrome.  DESCRIPTION OF PROCEDURE:  The patient was brought to the operating room and placed in supine position on the operating table. Following induction of general anesthesia by LMA, the left arm was prepped with  Betadine soap and solution and sterilely draped.  When anesthesia was satisfactory, the procedure commences with a short transverse incision directly over the palpable mass.  The subcutaneous tissues were carefully divided taking care to identify the radial wrist extensors and the finger extensors.  The wrist extensors were retracted in a radial direction, the finger extensors in the ulnar direction.  The capsular tissues were carefully dissected revealing a typical myxomatous/ganglion type lesion. This was dissected circumferentially down to the capsule of the wrist.  The posterior interosseous branches were identified on the radial aspect of the mass and gently retracted with a Soil scientist.  After blunt retractors were placed the ganglion was excised with a rongeur and the membrane passed off for pathologic evaluation.  The capsule was carefully inspected. Care was taken to protect the dorsal capsular ligaments.  The vessels at the base of the lesion were electrocauterized with bipolar current followed by careful cleaning of the scapholunate interosseous ligament.  There was no abnormal discoloration of the ligament.  It appeared that this ganglion was growing from the dorsal carpal arch of vessels more so than a true wrist capsule lesion.  The wound was thoroughly irrigated and subsequently repaired with intradermal 3-0 Prolene suture.  There was no sign of intra-articular pathology noted. The wound was repaired with intradermal 3-0 Prolene and Steri-Strips.  A compressive dressing was applied with a soft dressing of sterile Webril and Ace wrap.  For aftercare the patient was given a prescription  for Percocet 5/325 one or two tablets p.o. q.4-6h. pain, total of 20 tablets without refill. Dictated by:   Sheila [name] Sheila [name]., M.D. Attending Physician:  Sheila [name] DD:  02/16/02 TD:  02/16/02 Job: [id number]

## 2011-02-04 ENCOUNTER — Other Ambulatory Visit: Payer: Self-pay | Admitting: Family Medicine

## 2011-02-04 NOTE — Telephone Encounter (Signed)
#  90 x0 given on 11/26/10 at the time of med check ov. Please advise.

## 2011-02-04 NOTE — Telephone Encounter (Signed)
Hard copy faxed.

## 2011-03-26 ENCOUNTER — Other Ambulatory Visit: Payer: Self-pay | Admitting: Family Medicine

## 2011-03-27 NOTE — Telephone Encounter (Signed)
Last seen 11/26/10 and filled 02/04/11 # 90 please advise    KP

## 2011-03-27 NOTE — Telephone Encounter (Signed)
Faxed.   KP 

## 2011-05-17 ENCOUNTER — Other Ambulatory Visit: Payer: Self-pay | Admitting: Family Medicine

## 2011-05-17 MED ORDER — ALPRAZOLAM 0.5 MG PO TABS: 0.5000 mg | ORAL_TABLET | Freq: Three times a day (TID) | ORAL | Status: DC | PRN

## 2011-05-17 NOTE — Telephone Encounter (Signed)
Faxed     Kp

## 2011-05-17 NOTE — Telephone Encounter (Signed)
Last seen 11/26/10 and filled 03/26/11 please advise     KP

## 2011-05-22 ENCOUNTER — Other Ambulatory Visit: Payer: Self-pay | Admitting: Family Medicine

## 2011-05-22 NOTE — Telephone Encounter (Signed)
Last seen and filled 11/26/10 #60 with 5 refills  Please advise    KP

## 2011-06-24 ENCOUNTER — Other Ambulatory Visit: Payer: Self-pay | Admitting: Family Medicine

## 2011-06-24 NOTE — Telephone Encounter (Signed)
Last seen and filled 11/26/10 #60 with 5 refills. Please advise     KP

## 2011-06-25 LAB — CBC
HCT: 34.4 — ABNORMAL LOW
HCT: 37.1
Hemoglobin: 11.7 — ABNORMAL LOW
Hemoglobin: 12.6
MCHC: 34.1
MCV: 92.3
MCV: 92.4
Platelets: 221
RBC: 3.72 — ABNORMAL LOW
RBC: 4.02
WBC: 5.6

## 2011-06-25 LAB — BASIC METABOLIC PANEL
CO2: 24
Calcium: 9.4
Chloride: 103
GFR calc Af Amer: >60
Glucose, Bld: 107 — ABNORMAL HIGH
Potassium: 3.8

## 2011-06-25 LAB — URINALYSIS, ROUTINE W REFLEX MICROSCOPIC
Ketones, ur: 40 — AB
Leukocytes, UA: NEGATIVE
Nitrite: NEGATIVE
Specific Gravity, Urine: 1.02
Urobilinogen, UA: 0.2
pH: 5.5

## 2011-06-25 LAB — URINE CULTURE: Colony Count: 35000

## 2011-06-25 LAB — CULTURE, BLOOD (ROUTINE X 2): Culture: NO GROWTH

## 2011-06-25 LAB — DIFFERENTIAL
Basophils Relative: 0
Eosinophils Absolute: 0 — ABNORMAL LOW
Eosinophils Relative: 0
Monocytes Absolute: 0.5
Monocytes Relative: 7

## 2011-06-25 LAB — CARDIAC PANEL(CRET KIN+CKTOT+MB+TROPI)
CK, MB: 0.2 — ABNORMAL LOW
Relative Index: INVALID
Troponin I: 0.01
Troponin I: 0.01

## 2011-06-25 LAB — CK TOTAL AND CKMB (NOT AT ARMC)
Relative Index: INVALID
Total CK: 56

## 2011-06-25 LAB — URINE MICROSCOPIC-ADD ON

## 2011-06-25 LAB — POCT CARDIAC MARKERS: Troponin i, poc: <0.05

## 2011-06-25 LAB — TROPONIN I: Troponin I: 0.01

## 2011-06-28 ENCOUNTER — Other Ambulatory Visit: Payer: Self-pay | Admitting: Family Medicine

## 2011-06-28 MED ORDER — ALPRAZOLAM 0.5 MG PO TABS: 0.5000 mg | ORAL_TABLET | Freq: Three times a day (TID) | ORAL | Status: DC | PRN

## 2011-06-28 NOTE — Telephone Encounter (Signed)
Ok to refill x 1  

## 2011-06-28 NOTE — Telephone Encounter (Signed)
Please advise if ok to refill alprazolam 0.81m TID prn. Patient last seen 11/26/10 and med last refilled 05/17/11 #90/0rf. Thanks

## 2011-07-22 ENCOUNTER — Other Ambulatory Visit: Payer: Self-pay | Admitting: Family Medicine

## 2011-08-19 ENCOUNTER — Other Ambulatory Visit: Payer: Self-pay | Admitting: Family Medicine

## 2011-08-20 MED ORDER — ALPRAZOLAM 0.5 MG PO TABS: 0.5000 mg | ORAL_TABLET | Freq: Three times a day (TID) | ORAL | Status: DC | PRN

## 2011-08-20 NOTE — Telephone Encounter (Signed)
Last seen 11/26/10 and filled 07/22/11 # 60. Please advise    KP

## 2011-08-20 NOTE — Telephone Encounter (Signed)
Faxed.   KP 

## 2011-08-20 NOTE — Telephone Encounter (Signed)
Alprazolam last filled 06/28/11 # 90. Please advise     KP

## 2011-09-22 ENCOUNTER — Other Ambulatory Visit: Payer: Self-pay | Admitting: Family Medicine

## 2011-10-08 ENCOUNTER — Encounter: Payer: Self-pay | Admitting: Family Medicine

## 2011-10-08 ENCOUNTER — Ambulatory Visit (INDEPENDENT_AMBULATORY_CARE_PROVIDER_SITE_OTHER): Payer: Federal, State, Local not specified - PPO | Admitting: Family Medicine

## 2011-10-08 VITALS — BP 100/70 | HR 87 | Temp 98.7°F | Wt 125.0 lb

## 2011-10-08 DIAGNOSIS — F419 Anxiety disorder, unspecified: Secondary | ICD-10-CM

## 2011-10-08 DIAGNOSIS — F411 Generalized anxiety disorder: Secondary | ICD-10-CM

## 2011-10-08 DIAGNOSIS — G43909 Migraine, unspecified, not intractable, without status migrainosus: Secondary | ICD-10-CM

## 2011-10-08 MED ORDER — BUPROPION HCL ER (XL) 300 MG PO TB24: 300.0000 mg | ORAL_TABLET | Freq: Every day | ORAL | Status: DC

## 2011-10-08 MED ORDER — BACLOFEN 10 MG PO TABS: 10.0000 mg | ORAL_TABLET | Freq: Two times a day (BID) | ORAL | Status: DC

## 2011-10-08 MED ORDER — BUPROPION HCL ER (XL) 150 MG PO TB24: ORAL_TABLET | ORAL | Status: DC

## 2011-10-08 MED ORDER — ALPRAZOLAM 0.5 MG PO TABS: 0.5000 mg | ORAL_TABLET | Freq: Three times a day (TID) | ORAL | Status: DC | PRN

## 2011-10-08 NOTE — Progress Notes (Signed)
  Subjective:    Patient ID: Sheila [name], female    DOB: 01-02-1966, 46 y.o.   MRN: [id number]  HPI Pt here f/u anxiety.  She had to come off of savella secondary to IC worsening.  No other complaints.  Pt feels like the anxiety is worsening.    Review of Systems As above    Objective:   Physical Exam  Constitutional: She is oriented to person, place, and time. She appears well-developed and well-nourished.  Neurological: She is alert and oriented to person, place, and time.  Psychiatric: She has a normal mood and affect. Her behavior is normal. Judgment and thought content normal.          Assessment & Plan:  Anxiety---  Increase wellbutrin xl 150 mg  3 po qd                     Xanax refilled                  Consider changing to pristiq but Wellbutrin has worked in past

## 2011-10-08 NOTE — Patient Instructions (Signed)
Anxiety and Panic Attacks Your caregiver has informed you that you are having an anxiety or panic attack. There may be many forms of this. Most of the time these attacks come suddenly and without warning. They come at any time of day, including periods of sleep, and at any time of life. They may be strong and unexplained. Although panic attacks are very scary, they are physically harmless. Sometimes the cause of your anxiety is not known. Anxiety is a protective mechanism of the body in its fight or flight mechanism. Most of these perceived danger situations are actually nonphysical situations (such as anxiety over losing a job). CAUSES  The causes of an anxiety or panic attack are many. Panic attacks may occur in otherwise healthy people given a certain set of circumstances. There may be a genetic cause for panic attacks. Some medications may also have anxiety as a side effect. SYMPTOMS  Some of the most common feelings are:  Intense terror.   Dizziness, feeling faint.   Hot and cold flashes.   Fear of going crazy.   Feelings that nothing is real.   Sweating.   Shaking.   Chest pain or a fast heartbeat (palpitations).   Smothering, choking sensations.   Feelings of impending doom and that death is near.   Tingling of extremities, this may be from over-breathing.   Altered reality (derealization).   Being detached from yourself (depersonalization).  Several symptoms can be present to make up anxiety or panic attacks. DIAGNOSIS  The evaluation by your caregiver will depend on the type of symptoms you are experiencing. The diagnosis of anxiety or panic attack is made when no physical illness can be determined to be a cause of the symptoms. TREATMENT  Treatment to prevent anxiety and panic attacks may include:  Avoidance of circumstances that cause anxiety.   Reassurance and relaxation.   Regular exercise.   Relaxation therapies, such as yoga.   Psychotherapy with a  psychiatrist or therapist.   Avoidance of caffeine, alcohol and illegal drugs.   Prescribed medication.  SEEK IMMEDIATE MEDICAL CARE IF:   You experience panic attack symptoms that are different than your usual symptoms.   You have any worsening or concerning symptoms.  Document Released: 09/02/2005 Document Revised: 05/15/2011 Document Reviewed: 01/04/2010 [facility] Patient Information 2012 [employer].

## 2011-11-19 ENCOUNTER — Other Ambulatory Visit: Payer: Self-pay | Admitting: Family Medicine

## 2011-11-19 DIAGNOSIS — F419 Anxiety disorder, unspecified: Secondary | ICD-10-CM

## 2011-11-19 MED ORDER — ALPRAZOLAM 0.5 MG PO TABS: 0.5000 mg | ORAL_TABLET | Freq: Three times a day (TID) | ORAL | Status: DC | PRN

## 2011-11-19 NOTE — Telephone Encounter (Signed)
Last seen and filled 10/08/11 # 90. Please advise    KP

## 2011-11-19 NOTE — Telephone Encounter (Signed)
  RX refill for   ALPRAZolam (XANAX) 0.5 MG tablet gree

## 2011-11-20 ENCOUNTER — Telehealth: Payer: Self-pay

## 2011-11-20 DIAGNOSIS — F419 Anxiety disorder, unspecified: Secondary | ICD-10-CM

## 2011-11-20 NOTE — Telephone Encounter (Signed)
Patient called regarding the higher dosage of the Wellbutrin you had her on since last visit.  She wants to go back to the previous dosage of Wellbutrin.  You will need to call in a new Rx to [facility] for her please. Please call patient to discuss.

## 2011-11-20 NOTE — Telephone Encounter (Signed)
Ok to go back to other dosage---- please call pt for reason

## 2011-11-21 MED ORDER — BUPROPION HCL ER (XL) 300 MG PO TB24: 300.0000 mg | ORAL_TABLET | Freq: Every day | ORAL | Status: DC

## 2011-11-21 NOTE — Telephone Encounter (Signed)
That's fine

## 2011-11-21 NOTE — Telephone Encounter (Signed)
Patient felt more anxious with the higher dosage and some dizziness and unsteady with loss of balance the last ten days. She had also started her period (which she only does once every three months as she is taking continuous birth control pills), but her periods are not heavy.  She just feels better going back to the lower dosage.

## 2011-11-21 NOTE — Telephone Encounter (Signed)
Discuss with patient, Rx sent.

## 2011-11-21 NOTE — Telephone Encounter (Signed)
LMVM for patient to return call.

## 2011-12-31 ENCOUNTER — Telehealth: Payer: Self-pay | Admitting: Family Medicine

## 2011-12-31 DIAGNOSIS — F419 Anxiety disorder, unspecified: Secondary | ICD-10-CM

## 2011-12-31 NOTE — Telephone Encounter (Signed)
Last OV 10-08-11 last refill 11-19-11 #90 with no refills

## 2011-12-31 NOTE — Telephone Encounter (Signed)
Refill: Alprazolam .5 mg tab

## 2012-01-01 MED ORDER — ALPRAZOLAM 0.5 MG PO TABS: 0.5000 mg | ORAL_TABLET | Freq: Three times a day (TID) | ORAL | Status: DC | PRN

## 2012-01-01 NOTE — Telephone Encounter (Signed)
[place] for #90, no refills

## 2012-01-01 NOTE — Telephone Encounter (Signed)
.  rx faxed to pharmacy, manually.

## 2012-02-18 ENCOUNTER — Other Ambulatory Visit: Payer: Self-pay | Admitting: *Deleted

## 2012-02-18 DIAGNOSIS — F419 Anxiety disorder, unspecified: Secondary | ICD-10-CM

## 2012-02-18 MED ORDER — ALPRAZOLAM 0.5 MG PO TABS: 0.5000 mg | ORAL_TABLET | Freq: Three times a day (TID) | ORAL | Status: DC | PRN

## 2012-02-18 NOTE — Telephone Encounter (Signed)
Refill x1 

## 2012-02-18 NOTE — Telephone Encounter (Signed)
Faxed.   KP 

## 2012-02-18 NOTE — Telephone Encounter (Signed)
Last OV 10-08-11, last filled 01-01-12 #90

## 2012-02-20 ENCOUNTER — Other Ambulatory Visit: Payer: Self-pay | Admitting: Dermatology

## 2012-03-18 ENCOUNTER — Other Ambulatory Visit: Payer: Self-pay | Admitting: Family Medicine

## 2012-03-30 ENCOUNTER — Other Ambulatory Visit: Payer: Self-pay | Admitting: Family Medicine

## 2012-03-30 DIAGNOSIS — F419 Anxiety disorder, unspecified: Secondary | ICD-10-CM

## 2012-03-30 MED ORDER — ALPRAZOLAM 0.5 MG PO TABS: 0.5000 mg | ORAL_TABLET | Freq: Three times a day (TID) | ORAL | Status: DC | PRN

## 2012-03-30 NOTE — Telephone Encounter (Signed)
Refill x1 

## 2012-03-30 NOTE — Telephone Encounter (Signed)
ALPRAZolam (Tab) XANAX 0.5 MG Take 1 tablet (0.5 mg total) by mouth 3 (three) times daily as needed. Last fill 6.4.13  Last wrt 6.4.13 #90 Last ov 1.22.13

## 2012-03-30 NOTE — Telephone Encounter (Signed)
Please advise      KP 

## 2012-04-20 ENCOUNTER — Other Ambulatory Visit: Payer: Self-pay | Admitting: Family Medicine

## 2012-04-20 NOTE — Telephone Encounter (Signed)
Pt was supposed to return in 6 months--July.     Refill for 1 month---needs ov

## 2012-04-20 NOTE — Telephone Encounter (Signed)
Last seen 10/08/11-- no pending apts--- please advise    KP

## 2012-04-21 MED ORDER — BUPROPION HCL ER (XL) 300 MG PO TB24: 300.0000 mg | ORAL_TABLET | ORAL | Status: DC

## 2012-04-21 NOTE — Telephone Encounter (Signed)
Please call the patient and offer her an apt.     KP

## 2012-04-21 NOTE — Addendum Note (Signed)
Addended by: Ewing Schlein on: 04/21/2012 08:52 AM   Modules accepted: Orders

## 2012-04-23 NOTE — Telephone Encounter (Signed)
Called pt HP# ABM left mess. To call me back and schedule med renewal appt

## 2012-04-30 NOTE — Telephone Encounter (Signed)
made med renewal appt 8.19.13 @ 915am, pt voiced she thought she only needed to see dr Etter Sjogren one time per yr for her medications, apologized and was happy to come in for a 15-minute appt

## 2012-05-04 ENCOUNTER — Ambulatory Visit (INDEPENDENT_AMBULATORY_CARE_PROVIDER_SITE_OTHER): Payer: Federal, State, Local not specified - PPO | Admitting: Family Medicine

## 2012-05-04 ENCOUNTER — Encounter: Payer: Self-pay | Admitting: Family Medicine

## 2012-05-04 VITALS — BP 114/66 | HR 93 | Temp 98.0°F | Wt 118.0 lb

## 2012-05-04 DIAGNOSIS — F419 Anxiety disorder, unspecified: Secondary | ICD-10-CM

## 2012-05-04 DIAGNOSIS — F411 Generalized anxiety disorder: Secondary | ICD-10-CM

## 2012-05-04 DIAGNOSIS — M25569 Pain in unspecified knee: Secondary | ICD-10-CM

## 2012-05-04 DIAGNOSIS — M25562 Pain in left knee: Secondary | ICD-10-CM

## 2012-05-04 MED ORDER — BUPROPION HCL ER (XL) 300 MG PO TB24: 300.0000 mg | ORAL_TABLET | ORAL | Status: DC

## 2012-05-04 MED ORDER — ALPRAZOLAM 0.5 MG PO TABS: 0.5000 mg | ORAL_TABLET | Freq: Three times a day (TID) | ORAL | Status: DC | PRN

## 2012-05-04 NOTE — Assessment & Plan Note (Signed)
Pt doing well Refill meds

## 2012-05-04 NOTE — Assessment & Plan Note (Signed)
Injured years ago and recently started to bother her again Pt given knee sleeve and ortho referral put in

## 2012-05-04 NOTE — Patient Instructions (Addendum)

## 2012-05-04 NOTE — Progress Notes (Signed)
  Subjective:    Patient ID: Sheila [name], female    DOB: 1966-06-04, 46 y.o.   MRN: [id number]  HPI Pt here f/u anxiety and to get refills.  No issues with meds .  Pt also have trouble with L knee.  She injured it high school playing football and she said she tore all her collateral ligaments in her knee and was in a cast.  1 year ago it started to hurt again and this summer it really got bad.  She is requesting an ortho referral.   No new known injury.    Review of Systems As above    Objective:   Physical Exam  Constitutional: She is oriented to person, place, and time. She appears well-developed and well-nourished.  Musculoskeletal:       Pt unable to bend L knee secondary to pain -- pt unable to get up from floor   Pain throughout entire knee.  No swelling noted.  No errythema.  Neurological: She is alert and oriented to person, place, and time.  Psychiatric: She has a normal mood and affect. Her behavior is normal. Judgment and thought content normal.          Assessment & Plan:

## 2012-06-23 ENCOUNTER — Other Ambulatory Visit: Payer: Self-pay | Admitting: Family Medicine

## 2012-06-23 DIAGNOSIS — F419 Anxiety disorder, unspecified: Secondary | ICD-10-CM

## 2012-06-23 MED ORDER — ALPRAZOLAM 0.5 MG PO TABS: 0.5000 mg | ORAL_TABLET | Freq: Three times a day (TID) | ORAL | Status: DC | PRN

## 2012-06-23 NOTE — Telephone Encounter (Signed)
Refill x1 

## 2012-06-23 NOTE — Telephone Encounter (Signed)
Please advise      KP 

## 2012-06-23 NOTE — Telephone Encounter (Signed)
refill  ALPRAZolam (Tab) 0.5 MG Take 1 tablet (0.5 mg total) by mouth 3 (three) times daily as needed. --last fill 8.27.13, last ov 8.19.13 med refills

## 2012-07-29 ENCOUNTER — Other Ambulatory Visit: Payer: Self-pay | Admitting: Family Medicine

## 2012-07-30 NOTE — Telephone Encounter (Signed)
OV 05/04/12 Last filled 06/23/12 #90 no refill.   Plz advise    MW

## 2012-08-03 ENCOUNTER — Other Ambulatory Visit: Payer: Self-pay

## 2012-08-03 NOTE — Telephone Encounter (Signed)
Rx was not received by the pharmacy on 11/13-- Phoned in to the pharmacist [facility].      KP

## 2012-08-03 NOTE — Telephone Encounter (Signed)
i don't think I refused it-- she had ov in 8/13----ok to refill

## 2012-08-03 NOTE — Telephone Encounter (Signed)
Pt called [facility] asking why Rx denied for Xanax stating she has up to date OV. Pt states she is a long time pt of [facility] and wants to know whats going on. Pt states pharmacy is the new [facility] on [address] in [place]. Pt requesting CB and to call pharmacy. Plz advise pt JQ:[phone] MW

## 2012-09-17 ENCOUNTER — Other Ambulatory Visit: Payer: Self-pay | Admitting: Family Medicine

## 2012-09-17 NOTE — Telephone Encounter (Signed)
Last seen 05/04/12 and filled 07/29/12 # 90. please advise     KP

## 2012-09-18 MED ORDER — ALPRAZOLAM 0.5 MG PO TABS: ORAL_TABLET | ORAL | Status: DC

## 2012-09-18 NOTE — Telephone Encounter (Signed)
Msg left advising the patient the Rx is ready for pick up at the office...      KP

## 2012-09-18 NOTE — Addendum Note (Signed)
Addended by: Ewing Schlein on: 09/18/2012 08:09 AM   Modules accepted: Orders

## 2012-11-03 ENCOUNTER — Encounter: Payer: Self-pay | Admitting: Family Medicine

## 2012-11-11 ENCOUNTER — Telehealth: Payer: Self-pay | Admitting: Family Medicine

## 2012-11-11 MED ORDER — ALPRAZOLAM 0.5 MG PO TABS: ORAL_TABLET | ORAL | Status: DC

## 2012-11-11 NOTE — Telephone Encounter (Signed)
Ok to refill 

## 2012-11-11 NOTE — Telephone Encounter (Signed)
Last seen 05/04/12 and filled 09/18/12 #90. Please advise      KP

## 2012-11-11 NOTE — Telephone Encounter (Signed)
Refill: Alprazolam 0.5 mg tablets. Take 1 tablet by mouth three times daily as needed. Qty 90. Last fill 09-18-12

## 2012-12-14 ENCOUNTER — Telehealth: Payer: Self-pay | Admitting: Family Medicine

## 2012-12-14 NOTE — Telephone Encounter (Signed)
Opened in error. BC

## 2013-02-25 ENCOUNTER — Ambulatory Visit (INDEPENDENT_AMBULATORY_CARE_PROVIDER_SITE_OTHER): Payer: Federal, State, Local not specified - PPO | Admitting: Family Medicine

## 2013-02-25 ENCOUNTER — Encounter: Payer: Self-pay | Admitting: Family Medicine

## 2013-02-25 VITALS — BP 118/76 | HR 74 | Temp 99.0°F | Wt 104.4 lb

## 2013-02-25 DIAGNOSIS — K644 Residual hemorrhoidal skin tags: Secondary | ICD-10-CM | POA: Insufficient documentation

## 2013-02-25 DIAGNOSIS — R21 Rash and other nonspecific skin eruption: Secondary | ICD-10-CM

## 2013-02-25 MED ORDER — HYDROCORTISONE ACE-PRAMOXINE 1-1 % RE FOAM: 1.0000 | Freq: Two times a day (BID) | RECTAL | Status: DC

## 2013-02-25 MED ORDER — HYDROCORTISONE ACETATE 25 MG RE SUPP: 25.0000 mg | Freq: Two times a day (BID) | RECTAL | Status: DC

## 2013-02-25 MED ORDER — HYDROCORTISONE VALERATE 0.2 % EX OINT: TOPICAL_OINTMENT | Freq: Two times a day (BID) | CUTANEOUS | Status: DC

## 2013-02-25 NOTE — Assessment & Plan Note (Signed)
Proctofoam  anusol supp HO given to pt  If no relief f/u GI

## 2013-02-25 NOTE — Progress Notes (Signed)
  Subjective:    Patient ID: Sheila [name], female    DOB: 1966-07-21, 47 y.o.   MRN: [id number]  HPI Pt here c/o rash on chest everytime she goes into sun with sunscreen on.  If she goes out with no sunscreen she gets no rash.  Pt also c/o hemorrhoid--she has tried otc prep H with no relief.  It does occasionally bleed.   Review of Systems As above    Objective:   Physical Exam  BP 118/76  Pulse 74  Temp(Src) 99 F (37.2 C) (Oral)  Wt 104 lb 6.4 oz (47.356 kg)  BMI 18.2 kg/m2  SpO2 97% General appearance: alert, cooperative, appears stated age and no distress Skin: + papular rash on chest, pruritic Rectal-- + lg ext hemorrhoid--- no bleeding       Assessment & Plan:

## 2013-02-25 NOTE — Patient Instructions (Addendum)
Hemorrhoids Hemorrhoids are swollen veins around the rectum or anus. There are two types of hemorrhoids:   Internal hemorrhoids. These occur in the veins just inside the rectum. They may poke through to the outside and become irritated and painful.  External hemorrhoids. These occur in the veins outside the anus and can be felt as a painful swelling or hard lump near the anus. CAUSES  Pregnancy.   Obesity.   Constipation or diarrhea.   Straining to have a bowel movement.   Sitting for long periods on the toilet.  Heavy lifting or other activity that caused you to strain.  Anal intercourse. SYMPTOMS   Pain.   Anal itching or irritation.   Rectal bleeding.   Fecal leakage.   Anal swelling.   One or more lumps around the anus.  DIAGNOSIS  Your caregiver may be able to diagnose hemorrhoids by visual examination. Other examinations or tests that may be performed include:   Examination of the rectal area with a gloved hand (digital rectal exam).   Examination of anal canal using a small tube (scope).   A blood test if you have lost a significant amount of blood.  A test to look inside the colon (sigmoidoscopy or colonoscopy). TREATMENT Most hemorrhoids can be treated at home. However, if symptoms do not seem to be getting better or if you have a lot of rectal bleeding, your caregiver may perform a procedure to help make the hemorrhoids get smaller or remove them completely. Possible treatments include:   Placing a rubber band at the base of the hemorrhoid to cut off the circulation (rubber band ligation).   Injecting a chemical to shrink the hemorrhoid (sclerotherapy).   Using a tool to burn the hemorrhoid (infrared light therapy).   Surgically removing the hemorrhoid (hemorrhoidectomy).   Stapling the hemorrhoid to block blood flow to the tissue (hemorrhoid stapling).  HOME CARE INSTRUCTIONS   Eat foods with fiber, such as whole grains, beans,  nuts, fruits, and vegetables. Ask your doctor about taking products with added fiber in them (fibersupplements).  Increase fluid intake. Drink enough water and fluids to keep your urine clear or pale yellow.   Exercise regularly.   Go to the bathroom when you have the urge to have a bowel movement. Do not wait.   Avoid straining to have bowel movements.   Keep the anal area dry and clean. Use wet toilet paper or moist towelettes after a bowel movement.   Medicated creams and suppositories may be used or applied as directed.   Only take over-the-counter or prescription medicines as directed by your caregiver.   Take warm sitz baths for 15 20 minutes, 3 4 times a day to ease pain and discomfort.   Place ice packs on the hemorrhoids if they are tender and swollen. Using ice packs between sitz baths may be helpful.   Put ice in a plastic bag.   Place a towel between your skin and the bag.   Leave the ice on for 15 20 minutes, 3 4 times a day.   Do not use a donut-shaped pillow or sit on the toilet for long periods. This increases blood pooling and pain.  SEEK MEDICAL CARE IF:  You have increasing pain and swelling that is not controlled by treatment or medicine.  You have uncontrolled bleeding.  You have difficulty or you are unable to have a bowel movement.  You have pain or inflammation outside the area of the hemorrhoids. MAKE SURE YOU:    Understand these instructions.  Will watch your condition.  Will get help right away if you are not doing well or get worse. Document Released: 08/30/2000 Document Revised: 08/19/2012 Document Reviewed: 07/07/2012 [employer] Patient Information 2014 [employer].  

## 2013-05-23 ENCOUNTER — Other Ambulatory Visit: Payer: Self-pay | Admitting: Family Medicine

## 2013-05-24 NOTE — Telephone Encounter (Signed)
Wellbutrin refill Last OV 02-25-2013 Med last filled 05-04-12 #90 with 3 refills

## 2013-05-25 NOTE — Telephone Encounter (Signed)
Noted med filled on 9/8

## 2013-08-21 ENCOUNTER — Other Ambulatory Visit: Payer: Self-pay | Admitting: Family Medicine

## 2013-11-21 ENCOUNTER — Other Ambulatory Visit: Payer: Self-pay | Admitting: Family Medicine

## 2014-02-09 ENCOUNTER — Other Ambulatory Visit: Payer: Self-pay | Admitting: Family Medicine

## 2014-02-10 ENCOUNTER — Other Ambulatory Visit: Payer: Self-pay | Admitting: Family Medicine

## 2014-03-10 ENCOUNTER — Other Ambulatory Visit: Payer: Self-pay | Admitting: Family Medicine

## 2014-04-04 ENCOUNTER — Other Ambulatory Visit: Payer: Self-pay | Admitting: Family Medicine

## 2014-04-05 NOTE — Telephone Encounter (Signed)
Requesting Wellbutrin XL-Take 1 tablet by mouth daily. Last refill:03/11/14;#15,0 Last OV:02/25/13 Please advise.//AB/CMA

## 2014-04-07 NOTE — Telephone Encounter (Signed)
Patient called to check on the status of her refill. Also she states she took her last pill today. Please advise

## 2014-04-07 NOTE — Telephone Encounter (Signed)
OV needed.  Pt stated that she was driving and unable to schedule an appointment at this time.  Stated that she would call back in 10 minutes.  She should be home by then.    Please advise regarding medication refill.

## 2014-04-07 NOTE — Telephone Encounter (Signed)
Office visit scheduled for 8/12/156 @ 11:15 am.

## 2014-04-25 ENCOUNTER — Ambulatory Visit: Payer: Federal, State, Local not specified - PPO | Admitting: Family Medicine

## 2014-04-27 ENCOUNTER — Ambulatory Visit (INDEPENDENT_AMBULATORY_CARE_PROVIDER_SITE_OTHER): Payer: Federal, State, Local not specified - PPO | Admitting: Family Medicine

## 2014-04-27 ENCOUNTER — Other Ambulatory Visit: Payer: Self-pay | Admitting: Family Medicine

## 2014-04-27 ENCOUNTER — Encounter: Payer: Self-pay | Admitting: Family Medicine

## 2014-04-27 VITALS — BP 108/62 | HR 93 | Temp 98.2°F | Wt 112.0 lb

## 2014-04-27 DIAGNOSIS — F3289 Other specified depressive episodes: Secondary | ICD-10-CM

## 2014-04-27 DIAGNOSIS — R21 Rash and other nonspecific skin eruption: Secondary | ICD-10-CM

## 2014-04-27 DIAGNOSIS — F329 Major depressive disorder, single episode, unspecified: Secondary | ICD-10-CM

## 2014-04-27 DIAGNOSIS — F32A Depression, unspecified: Secondary | ICD-10-CM

## 2014-04-27 MED ORDER — BUPROPION HCL ER (XL) 150 MG PO TB24: ORAL_TABLET | ORAL | Status: DC

## 2014-04-27 MED ORDER — NYSTATIN 100000 UNIT/GM EX POWD: CUTANEOUS | Status: DC

## 2014-04-27 NOTE — Patient Instructions (Signed)
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
## 2014-04-27 NOTE — Progress Notes (Signed)
Pre visit review using our clinic review tool, if applicable. No additional management support is needed unless otherwise documented below in the visit note. 

## 2014-05-02 ENCOUNTER — Other Ambulatory Visit: Payer: Self-pay | Admitting: Family Medicine

## 2014-05-31 ENCOUNTER — Telehealth: Payer: Self-pay | Admitting: Family Medicine

## 2014-05-31 MED ORDER — BUPROPION HCL ER (XL) 300 MG PO TB24: 300.0000 mg | ORAL_TABLET | Freq: Every day | ORAL | Status: DC

## 2014-05-31 NOTE — Telephone Encounter (Signed)
Pt called to make Dr. Etter Sjogren aware that she decreased her Wellbutrin back to down to 300 mg stating that the 450 mg was causing constant, all-day tremors.  She decreased her dose to 300 mg approximately 10 days ago.  For the past 8 days, she has not noticed the tremors.  She would like to stay at this dose and asks that Dr. Etter Sjogren send in a new prescription.  Spoke with Dr. Etter Sjogren and made her aware.  Dr. Etter Sjogren ok'd the prescription change.  Original order discontinued.  New prescription sent to [id number] - [place], [place] - [address] AT [address] per patient's request.  Follow up appointment with Dr. Etter Sjogren scheduled for Friday, Nov. 20th at 1 pm.

## 2014-05-31 NOTE — Telephone Encounter (Signed)
Caller name: Daquisha  Relation to pt: self  Call back number: [phone]   Reason for call:   Pt experiencing trimmers taken buPROPion (WELLBUTRIN XL) 150 MG 24 hr tablet.

## 2014-08-05 ENCOUNTER — Encounter: Payer: Self-pay | Admitting: Family Medicine

## 2014-08-05 ENCOUNTER — Ambulatory Visit (INDEPENDENT_AMBULATORY_CARE_PROVIDER_SITE_OTHER): Payer: Federal, State, Local not specified - PPO | Admitting: Family Medicine

## 2014-08-05 VITALS — BP 120/78 | HR 89 | Temp 98.1°F | Resp 16 | Wt 111.1 lb

## 2014-08-05 DIAGNOSIS — F32A Depression, unspecified: Secondary | ICD-10-CM

## 2014-08-05 DIAGNOSIS — F329 Major depressive disorder, single episode, unspecified: Secondary | ICD-10-CM

## 2014-08-05 DIAGNOSIS — R21 Rash and other nonspecific skin eruption: Secondary | ICD-10-CM

## 2014-08-05 DIAGNOSIS — J302 Other seasonal allergic rhinitis: Secondary | ICD-10-CM

## 2014-08-05 MED ORDER — FLUTICASONE PROPIONATE 50 MCG/ACT NA SUSP: 2.0000 | Freq: Every day | NASAL | Status: DC

## 2014-08-05 MED ORDER — BUPROPION HCL ER (XL) 300 MG PO TB24: 300.0000 mg | ORAL_TABLET | Freq: Every day | ORAL | Status: DC

## 2014-08-05 MED ORDER — CLOTRIMAZOLE-BETAMETHASONE 1-0.05 % EX CREA: 1.0000 "application " | TOPICAL_CREAM | Freq: Two times a day (BID) | CUTANEOUS | Status: DC

## 2014-08-05 NOTE — Progress Notes (Signed)
Pre visit review using our clinic review tool, if applicable. No additional management support is needed unless otherwise documented below in the visit note. 

## 2014-08-05 NOTE — Progress Notes (Signed)
   Subjective:    Patient ID: Sheila [name], female    DOB: 05/06/1966, 48 y.o.   MRN: [id number]  HPI Pt here c/o depression/anxiety--she is doing well with the 300 mg wellbutrin and xanax.   She is also c/o recurrent rash--- antifungal powder helped but then it came right back. Pt has tried several creams and powders.    She is requesting a refill on her flonase as well.    Review of Systems As above    Objective:   Physical Exam  BP 120/78 mmHg  Pulse 89  Temp(Src) 98.1 F (36.7 C) (Oral)  Resp 16  Wt 111 lb 2 oz (50.406 kg)  SpO2 98% General appearance: alert, cooperative, appears stated age and no distress Throat: lips, mucosa, and tongue normal; teeth and gums normal Neck: no adenopathy, supple, symmetrical, trachea midline and thyroid not enlarged, symmetric, no tenderness/mass/nodules Lungs: clear to auscultation bilaterally Heart: S1, S2 normal Extremities: extremities normal, atraumatic, no cyanosis or edema Skin: + papules on L shoulder and between breasts, pruritic      Assessment & Plan:  1. Depression con't meds-- wellbutrin and xanax rto 6 months or sooner prn - buPROPion (WELLBUTRIN XL) 300 MG 24 hr tablet; Take 1 tablet (300 mg total) by mouth daily.  Dispense: 90 tablet; Refill: 3  2. Seasonal allergies con't meds, stable - fluticasone (FLONASE) 50 MCG/ACT nasal spray; Place 2 sprays into both nostrils daily.  Dispense: 16 g; Refill: 6  3. Rash and nonspecific skin eruption If no relief rto for biopsy or refer to derm , [facility] - clotrimazole-betamethasone (LOTRISONE) cream; Apply 1 application topically 2 (two) times daily.  Dispense: 30 g; Refill: 0

## 2014-08-05 NOTE — Patient Instructions (Signed)

## 2014-09-29 ENCOUNTER — Encounter: Payer: Self-pay | Admitting: Gastroenterology

## 2014-11-22 ENCOUNTER — Other Ambulatory Visit (INDEPENDENT_AMBULATORY_CARE_PROVIDER_SITE_OTHER): Payer: Federal, State, Local not specified - PPO

## 2014-11-22 ENCOUNTER — Encounter: Payer: Self-pay | Admitting: Gastroenterology

## 2014-11-22 ENCOUNTER — Ambulatory Visit (INDEPENDENT_AMBULATORY_CARE_PROVIDER_SITE_OTHER): Payer: Federal, State, Local not specified - PPO | Admitting: Gastroenterology

## 2014-11-22 VITALS — BP 120/70 | HR 96 | Ht 63.75 in | Wt 110.4 lb

## 2014-11-22 DIAGNOSIS — K5090 Crohn's disease, unspecified, without complications: Secondary | ICD-10-CM

## 2014-11-22 LAB — COMPREHENSIVE METABOLIC PANEL
ALBUMIN: 4.3 g/dL (ref 3.5–5.2)
ALT: 17 U/L (ref 0–35)
AST: 18 U/L (ref 0–37)
Alkaline Phosphatase: 47 U/L (ref 39–117)
BUN: 9 mg/dL (ref 6–23)
CALCIUM: 9.1 mg/dL (ref 8.4–10.5)
CHLORIDE: 109 meq/L (ref 96–112)
CO2: 25 meq/L (ref 19–32)
CREATININE: 1.08 mg/dL (ref 0.40–1.20)
GFR: 57.88 mL/min — AB (ref >60.00)
Glucose, Bld: 105 mg/dL — ABNORMAL HIGH (ref 70–99)
POTASSIUM: 3.6 meq/L (ref 3.5–5.1)
Sodium: 139 mEq/L (ref 135–145)
Total Bilirubin: 0.3 mg/dL (ref 0.2–1.2)
Total Protein: 7.3 g/dL (ref 6.0–8.3)

## 2014-11-22 LAB — CBC WITH DIFFERENTIAL/PLATELET
BASOS PCT: 1.1 % (ref 0.0–3.0)
Basophils Absolute: 0.1 10*3/uL (ref 0.0–0.1)
Eosinophils Absolute: 0.2 10*3/uL (ref 0.0–0.7)
Eosinophils Relative: 2.2 % (ref 0.0–5.0)
HCT: 41.9 % (ref 36.0–46.0)
Hemoglobin: 14.2 g/dL (ref 12.0–15.0)
LYMPHS ABS: 2.2 10*3/uL (ref 0.7–4.0)
LYMPHS PCT: 26 % (ref 12.0–46.0)
MCHC: 33.9 g/dL (ref 30.0–36.0)
MCV: 91.5 fl (ref 78.0–100.0)
MONOS PCT: 6.8 % (ref 3.0–12.0)
Monocytes Absolute: 0.6 10*3/uL (ref 0.1–1.0)
NEUTROS ABS: 5.3 10*3/uL (ref 1.4–7.7)
Neutrophils Relative %: 63.9 % (ref 43.0–77.0)
Platelets: 303 10*3/uL (ref 150.0–400.0)
RBC: 4.58 Mil/uL (ref 3.87–5.11)
RDW: 13 % (ref 11.5–15.5)
WBC: 8.3 10*3/uL (ref 4.0–10.5)

## 2014-11-22 LAB — SEDIMENTATION RATE: SED RATE: 11 mm/h (ref 0–22)

## 2014-11-22 NOTE — Patient Instructions (Signed)
You will have labs checked today in the basement lab.  Please head down after you check out with the front desk  (cbc, cmet, Gi pathogen panel, ESR).

## 2014-11-22 NOTE — Progress Notes (Signed)
Review of pertinent gastrointestinal problems: 1. Crohn's disease Copy of note from Dr. Sharlett Iles dated 02/2010: "Sheila [name] is a long-term patient of mine who has Crohn's ileocolitis previously documented by colonoscopy and mucosal biopsies. She also has chronic anxiety, an element of IBS, and chronic interstitial cystitis managed by Dr. Alona Bene."  At that time he thought she may be having a flare, started her on lialda 2 pills daily; labs showed normal CBC, normal CRP, normal CMET, normal stool testing.  Colonoscopy Dr. Ardis Hughs 2010 (for DR[facility] at [place]) showed "normal TI, normal colon" Biopsies from TI were normal, random colon biopsies suggested chronic inflammation. Colonoscopy Dr. Sharlett Iles 2005 showed "erosions and granularity of TI and colon", pathology of both showed "focally active inflammation."  HPI: This is a   very pleasant, anxious 49 year old woman whom I am meeting for the first time today.   She feels she occasionally has flares with crohn's.  She has "IC, fibro, chronic migraines" and finds it tough to know if she is having a flare.  She has diarrhea at times.    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  Her bowel in past several month: she has signficant anal pain; green colored stools, black tarry colored stools, very muddy stools.  She has mushy stools.  On very rare occasions it will be solid.  She is never constipated.  Very difficult historian  She feels she has   Overall her weight has been stable for many years.   Review of systems: Pertinent positive and negative review of systems were noted in the above HPI section. Complete review of systems was performed and was otherwise normal.    Past Medical History  Diagnosis Date  . Crohn disease   . SLE (systemic lupus erythematosus)   . Fibromyalgia   . Chronic interstitial cystitis   . Migraines   . Anal fissure   . Anxiety   . Pneumonia 2007    Past Surgical  History  Procedure Laterality Date  . Cesarean section    . Knee arthroscopy    . Wrist surgery    . Oophorectomy      right  . Bladder surgery      Current Outpatient Prescriptions  Medication Sig Dispense Refill  . ALPRAZolam (XANAX) 0.5 MG tablet TAKE 1 TABLET BY MOUTH THREE TIMES DAILY AS NEEDED 90 tablet 0  . buPROPion (WELLBUTRIN XL) 300 MG 24 hr tablet Take 1 tablet (300 mg total) by mouth daily. 90 tablet 3  . fluticasone (FLONASE) 50 MCG/ACT nasal spray Place 2 sprays into both nostrils daily. 16 g 6  . nystatin (MYCOSTATIN/NYSTOP) 100000 UNIT/GM POWD Apply tid 60 g 0  . OGESTREL 0.5-50 MG-MCG tablet Take 1 tablet by mouth daily.    Marland Kitchen tiZANidine (ZANAFLEX) 4 MG tablet Take 4 mg by mouth as needed.     . topiramate (TOPAMAX) 200 MG tablet     . topiramate (TOPAMAX) 25 MG tablet Take 50 mg by mouth daily.     No current facility-administered medications for this visit.    Allergies as of 11/22/2014 - Review Complete 11/22/2014  Allergen Reaction Noted  . Ampicillin    . Fentanyl citrate    . Penicillins      Family History  Problem Relation Age of Onset  . Breast cancer Maternal Grandmother   . Breast cancer Maternal Aunt   . Breast cancer Mother   . Prostate cancer Maternal Grandfather   .  Irritable bowel syndrome Sister   . Colon cancer Neg Hx   . Colon polyps Neg Hx   . Lung cancer Father     Died in 2011/11/12  . Esophageal cancer Neg Hx   . Kidney disease Neg Hx   . Gallbladder disease Neg Hx     History   Social History  . Marital Status: Married    Spouse Name: N/A  . Number of Children: 2  . Years of Education: N/A   Occupational History  . Preschool teacher    Social History Main Topics  . Smoking status: Never Smoker   . Smokeless tobacco: Never Used  . Alcohol Use: No  . Drug Use: No  . Sexual Activity: Not on file   Other Topics Concern  . Not on file   Social History Narrative       Physical Exam: Ht 5' 3.75" (1.619 m)  Wt 110  lb 6 oz (50.066 kg)  BMI 19.10 kg/m2 Constitutional: generally well-appearing Psychiatric: alert and oriented x3 Eyes: extraocular movements intact Mouth: oral pharynx moist, no lesions Neck: supple no lymphadenopathy Cardiovascular: heart regular rate and rhythm Lungs: clear to auscultation bilaterally Abdomen: soft, nontender, nondistended, no obvious ascites, no peritoneal signs, normal bowel sounds Extremities: no lower extremity edema bilaterally Skin: no lesions on visible extremities    Assessment and plan: 49 y.o. female with  history of fibromyalgia, chronic migraines, interstitial cystitis, possible Crohn's disease  I actually performed her last colonoscopy about 6 years ago and it endoscopically was completely normal. Biopsies suggested some mild chronic changes in her colon. I'm underwhelmed by her history of Crohn's disease to be honest. She has myriad other troubles with fibromyalgia, interstitial cystitis, chronic migraines. I cannot tell that she's had any real significant acute changes in her bowels. She certainly does not have a classic history of significant diarrhea with abdominal  pain. She had she really has no abdominal pains and she is nontender on examination today. I would like to first proceed with some lab testing including a CBC, sedimentation rate, complete metabolic profile and a GI pathogen panel.   Cc: Rosalita Chessman, DO

## 2014-12-01 ENCOUNTER — Other Ambulatory Visit: Payer: Self-pay | Admitting: Obstetrics and Gynecology

## 2014-12-02 LAB — CYTOLOGY - PAP

## 2015-04-13 ENCOUNTER — Encounter: Payer: Self-pay | Admitting: Family Medicine

## 2015-04-13 ENCOUNTER — Ambulatory Visit (INDEPENDENT_AMBULATORY_CARE_PROVIDER_SITE_OTHER): Payer: Federal, State, Local not specified - PPO | Admitting: Family Medicine

## 2015-04-13 VITALS — BP 100/60 | HR 82 | Temp 98.7°F | Resp 18 | Ht 63.75 in | Wt 123.6 lb

## 2015-04-13 DIAGNOSIS — F411 Generalized anxiety disorder: Secondary | ICD-10-CM | POA: Diagnosis not present

## 2015-04-13 DIAGNOSIS — M797 Fibromyalgia: Secondary | ICD-10-CM

## 2015-04-13 DIAGNOSIS — R21 Rash and other nonspecific skin eruption: Secondary | ICD-10-CM | POA: Diagnosis not present

## 2015-04-13 DIAGNOSIS — R413 Other amnesia: Secondary | ICD-10-CM | POA: Diagnosis not present

## 2015-04-13 MED ORDER — ESCITALOPRAM OXALATE 10 MG PO TABS: 10.0000 mg | ORAL_TABLET | Freq: Every day | ORAL | Status: DC

## 2015-04-13 NOTE — Progress Notes (Signed)
Patient ID: Sheila [name], female    DOB: 1965-12-20  Age: 49 y.o. MRN: [id number]    Subjective:  Subjective HPI Sheila [name] presents for f/u from rheum this am.  She is stuggling with memory loss, she is also c/o increasing in lupus symptoms so urologist set her up with rheum and she saw her this am.  See report in care everywhere.     Review of Systems  Constitutional: Negative for diaphoresis, appetite change, fatigue and unexpected weight change.  Eyes: Negative for pain, redness and visual disturbance.  Respiratory: Negative for cough, chest tightness, shortness of breath and wheezing.   Cardiovascular: Negative for chest pain, palpitations and leg swelling.  Endocrine: Negative for cold intolerance, heat intolerance, polydipsia, polyphagia and polyuria.  Genitourinary: Negative for dysuria, frequency and difficulty urinating.  Neurological: Negative for dizziness, light-headedness, numbness and headaches.    History Past Medical History  Diagnosis Date  . Crohn disease   . SLE (systemic lupus erythematosus)   . Fibromyalgia   . Chronic interstitial cystitis   . Migraines   . Anal fissure   . Anxiety   . Pneumonia 2007    She has past surgical history that includes Cesarean section; Knee arthroscopy; Wrist surgery; Oophorectomy; and Bladder surgery.   Her family history includes Breast cancer in her maternal aunt, maternal grandmother, and mother; Irritable bowel syndrome in her sister; Lung cancer in her father; Prostate cancer in her maternal grandfather. There is no history of Colon cancer, Colon polyps, Esophageal cancer, Kidney disease, or Gallbladder disease.She reports that she has never smoked. She has never used smokeless tobacco. She reports that she does not drink alcohol or use illicit drugs.  Current Outpatient Prescriptions on File Prior to Visit  Medication Sig Dispense Refill  . ALPRAZolam (XANAX) 0.5 MG tablet TAKE 1 TABLET BY MOUTH THREE TIMES DAILY AS  NEEDED 90 tablet 0  . buPROPion (WELLBUTRIN XL) 300 MG 24 hr tablet Take 1 tablet (300 mg total) by mouth daily. (Patient taking differently: Take 200 mg by mouth daily. ) 90 tablet 3  . OGESTREL 0.5-50 MG-MCG tablet Take 1 tablet by mouth daily.    Marland Kitchen tiZANidine (ZANAFLEX) 4 MG tablet Take 4 mg by mouth as needed.     . topiramate (TOPAMAX) 200 MG tablet Take 200 mg by mouth daily. Pt taking 1 1/2 tablets, by mouth, every day    . fluticasone (FLONASE) 50 MCG/ACT nasal spray Place 2 sprays into both nostrils daily. (Patient not taking: Reported on 04/13/2015) 16 g 6  . nystatin (MYCOSTATIN/NYSTOP) 100000 UNIT/GM POWD Apply tid (Patient not taking: Reported on 04/13/2015) 60 g 0   No current facility-administered medications on file prior to visit.     Objective:  Objective Physical Exam  Constitutional: She is oriented to person, place, and time. She appears well-developed and well-nourished.  HENT:  Head: Normocephalic and atraumatic.  Eyes: Conjunctivae and EOM are normal.  Neck: Normal range of motion. Neck supple. No JVD present. Carotid bruit is not present. No thyromegaly present.  Cardiovascular: Normal rate, regular rhythm and normal heart sounds.   No murmur heard. Pulmonary/Chest: Effort normal and breath sounds normal. No respiratory distress. She has no wheezes. She has no rales. She exhibits no tenderness.  Musculoskeletal: She exhibits no edema.  Neurological: She is alert and oriented to person, place, and time.  Psychiatric: She has a normal mood and affect.   BP 100/60 mmHg  Pulse 82  Temp(Src) 98.7 F (37.1  C) (Oral)  Resp 18  Ht 5' 3.75" (1.619 m)  Wt 123 lb 9.6 oz (56.065 kg)  BMI 21.39 kg/m2  SpO2 98% Wt Readings from Last 3 Encounters:  04/13/15 123 lb 9.6 oz (56.065 kg)  11/22/14 110 lb 6 oz (50.066 kg)  08/05/14 111 lb 2 oz (50.406 kg)     Lab Results  Component Value Date   WBC 8.3 11/22/2014   HGB 14.2 11/22/2014   HCT 41.9 11/22/2014   PLT 303.0  11/22/2014   GLUCOSE 105* 11/22/2014   ALT 17 11/22/2014   AST 18 11/22/2014   NA 139 11/22/2014   K 3.6 11/22/2014   CL 109 11/22/2014   CREATININE 1.08 11/22/2014   BUN 9 11/22/2014   CO2 25 11/22/2014   TSH 0.48 03/05/2010    No results found.   Assessment & Plan:  Plan I am having Sheila [name] start on escitalopram. I am also having her maintain her ALPRAZolam, tiZANidine, OGESTREL, nystatin, topiramate, buPROPion, fluticasone, traZODone, and traMADol.  Meds ordered this encounter  Medications  . traZODone (DESYREL) 50 MG tablet    Sig: 1-2 po qhs  . traMADol (ULTRAM) 50 MG tablet    Sig: 1-2 po q6 hr prn pain  . escitalopram (LEXAPRO) 10 MG tablet    Sig: Take 1 tablet (10 mg total) by mouth daily.    Dispense:  30 tablet    Refill:  2    Problem List Items Addressed This Visit    Rash and nonspecific skin eruption    May need bx Pt is waiting for labs to come back from rheum at [facility]--- they were drawn today and are not back yet.   I will be able to see whats been done on care everywhere.   Pt will ask rheum if they will want to do bx      Memory loss of unknown cause    ? If from fibro or lupus May need neuro referral.  Pt thinks she wants to stay in [place] for this.  Again we are waiting for the results from rheum.       Other Visit Diagnoses    Generalized anxiety disorder    -  Primary    Relevant Medications    escitalopram (LEXAPRO) 10 MG tablet    Fibromyalgia           Follow-up: Return in about 4 weeks (around 05/11/2015), or if symptoms worsen or fail to improve, for anxiety.  Sheila Koyanagi, DO

## 2015-04-13 NOTE — Assessment & Plan Note (Signed)
?   If from fibro or lupus May need neuro referral.  Pt thinks she wants to stay in [place] for this.  Again we are waiting for the results from rheum.

## 2015-04-13 NOTE — Assessment & Plan Note (Signed)
May need bx Pt is waiting for labs to come back from rheum at [facility]--- they were drawn today and are not back yet.   I will be able to see whats been done on care everywhere.   Pt will ask rheum if they will want to do bx

## 2015-04-13 NOTE — Patient Instructions (Signed)
Generalized Anxiety Disorder Generalized anxiety disorder (GAD) is a mental disorder. It interferes with life functions, including relationships, work, and school. GAD is different from normal anxiety, which everyone experiences at some point in their lives in response to specific life events and activities. Normal anxiety actually helps us prepare for and get through these life events and activities. Normal anxiety goes away after the event or activity is over.  GAD causes anxiety that is not necessarily related to specific events or activities. It also causes excess anxiety in proportion to specific events or activities. The anxiety associated with GAD is also difficult to control. GAD can vary from mild to severe. People with severe GAD can have intense waves of anxiety with physical symptoms (panic attacks).  SYMPTOMS The anxiety and worry associated with GAD are difficult to control. This anxiety and worry are related to many life events and activities and also occur more days than not for 6 months or longer. People with GAD also have three or more of the following symptoms (one or more in children):  Restlessness.   Fatigue.  Difficulty concentrating.   Irritability.  Muscle tension.  Difficulty sleeping or unsatisfying sleep. DIAGNOSIS GAD is diagnosed through an assessment by your health care provider. Your health care provider will ask you questions aboutyour mood,physical symptoms, and events in your life. Your health care provider may ask you about your medical history and use of alcohol or drugs, including prescription medicines. Your health care provider may also do a physical exam and blood tests. Certain medical conditions and the use of certain substances can cause symptoms similar to those associated with GAD. Your health care provider may refer you to a mental health specialist for further evaluation. TREATMENT The following therapies are usually used to treat GAD:    Medication. Antidepressant medication usually is prescribed for long-term daily control. Antianxiety medicines may be added in severe cases, especially when panic attacks occur.   Talk therapy (psychotherapy). Certain types of talk therapy can be helpful in treating GAD by providing support, education, and guidance. A form of talk therapy called cognitive behavioral therapy can teach you healthy ways to think about and react to daily life events and activities.  Stress managementtechniques. These include yoga, meditation, and exercise and can be very helpful when they are practiced regularly. A mental health specialist can help determine which treatment is best for you. Some people see improvement with one therapy. However, other people require a combination of therapies. Document Released: 12/28/2012 Document Revised: 01/17/2014 Document Reviewed: 12/28/2012 Exit[employer] Patient Information 2015 [employer]. This information is not intended to replace advice given to you by your health care provider. Make sure you discuss any questions you have with your health care provider.  

## 2015-04-13 NOTE — Progress Notes (Signed)
Pre visit review using our clinic review tool, if applicable. No additional management support is needed unless otherwise documented below in the visit note. 

## 2015-04-28 ENCOUNTER — Other Ambulatory Visit: Payer: Self-pay | Admitting: Family Medicine

## 2015-07-11 ENCOUNTER — Ambulatory Visit (INDEPENDENT_AMBULATORY_CARE_PROVIDER_SITE_OTHER): Payer: Federal, State, Local not specified - PPO | Admitting: Family Medicine

## 2015-07-11 ENCOUNTER — Encounter: Payer: Self-pay | Admitting: Family Medicine

## 2015-07-11 VITALS — BP 108/68 | HR 75 | Temp 99.1°F | Wt 120.2 lb

## 2015-07-11 DIAGNOSIS — R251 Tremor, unspecified: Secondary | ICD-10-CM | POA: Diagnosis not present

## 2015-07-11 DIAGNOSIS — R413 Other amnesia: Secondary | ICD-10-CM

## 2015-07-11 DIAGNOSIS — Z23 Encounter for immunization: Secondary | ICD-10-CM

## 2015-07-11 DIAGNOSIS — G43909 Migraine, unspecified, not intractable, without status migrainosus: Secondary | ICD-10-CM | POA: Insufficient documentation

## 2015-07-11 DIAGNOSIS — F411 Generalized anxiety disorder: Secondary | ICD-10-CM | POA: Diagnosis not present

## 2015-07-11 DIAGNOSIS — G43009 Migraine without aura, not intractable, without status migrainosus: Secondary | ICD-10-CM

## 2015-07-11 MED ORDER — BUPROPION HCL ER (XL) 300 MG PO TB24: 300.0000 mg | ORAL_TABLET | Freq: Every day | ORAL | Status: DC

## 2015-07-11 MED ORDER — ESCITALOPRAM OXALATE 10 MG PO TABS: 10.0000 mg | ORAL_TABLET | Freq: Every day | ORAL | Status: DC

## 2015-07-11 NOTE — Assessment & Plan Note (Signed)
?   From topamax Pt will dec dose slightly and see if symptoms resolve But she is requesting a referral to neuro

## 2015-07-11 NOTE — Assessment & Plan Note (Signed)
Signigicant improvement with topamax

## 2015-07-11 NOTE — Assessment & Plan Note (Addendum)
Improved with meds Recheck 3 months

## 2015-07-11 NOTE — Patient Instructions (Signed)
Tremor A tremor is trembling or shaking that you cannot control. Most tremors affect the hands or arms. Tremors can also affect the head, vocal cords, face, and other parts of the body. There are many types of tremors. Common types include:   Essential tremor. These usually occur in people over the age of 50. It may run in families and can happen in otherwise healthy people.   Resting tremor. These occur when the muscles are at rest, such as when your hands are resting in your lap. People with Parkinson disease often have resting tremors.   Postural tremor. These occur when you try to hold a pose, such as keeping your hands outstretched.   Kinetic tremor. These occur during purposeful movement, such as trying to touch a finger to your nose.   Task-specific tremor. These may occur when you perform tasks such as handwriting, speaking, or standing.   Psychogenic tremor. These dramatically lessen or disappear when you are distracted. They can happen in people of all ages.  Some types of tremors have no known cause. Tremors can also be a symptom of nervous system problems (neurological disorders) that may occur with aging. Some tremors go away with treatment while others do not.  HOME CARE INSTRUCTIONS Watch your tremor for any changes. The following actions may help to lessen any discomfort you are feeling:  Take medicines only as directed by your health care provider.   Limit alcohol intake to no more than 1 drink per day for nonpregnant women and 2 drinks per day for men. One drink equals 12 oz of beer, 5 oz of wine, or 1 oz of hard liquor.  Do not use any tobacco products, including cigarettes, chewing tobacco, or electronic cigarettes. If you need help quitting, ask your health care provider.   Avoid extreme heat or cold.   Limit the amount of caffeine you consumeas directed by your health care provider.   Try to get 8 hours of sleep each night.  Find ways to manage your  stress, such as meditation or yoga.  Keep all follow-up visits as directed by your health care provider. This is important. SEEK MEDICAL CARE IF:  You start having a tremor after starting a new medicine.  You have tremor with other symptoms such as:  Numbness.  Tingling.  Pain.  Weakness.  Your tremor gets worse.  Your tremor interferes with your day-to-day life.   This information is not intended to replace advice given to you by your health care provider. Make sure you discuss any questions you have with your health care provider.   Document Released: 08/23/2002 Document Revised: 09/23/2014 Document Reviewed: 02/28/2014 Elsevier Interactive Patient Education [employer].

## 2015-07-11 NOTE — Progress Notes (Signed)
Patient ID: Sheila [name], female    DOB: [birth year]  Age: 49 y.o. MRN: [id number]    Subjective:  Subjective HPI Sheila [name] presents for f/u anxiety.  She is having trouble with brain fog and tremor. And thinks she may need a neuro.  We discussed the possibility of it being the topamax ---she has started dropping things in the last 2 weeks.  She denies numbness in hands.    Review of Systems  Constitutional: Negative for fever, chills, diaphoresis, activity change, appetite change, fatigue and unexpected weight change.  Eyes: Negative for pain, redness and visual disturbance.  Respiratory: Negative for cough, chest tightness, shortness of breath and wheezing.   Cardiovascular: Negative for chest pain, palpitations and leg swelling.  Gastrointestinal: Negative for abdominal pain and abdominal distention.  Endocrine: Negative for cold intolerance, heat intolerance, polydipsia, polyphagia and polyuria.  Genitourinary: Negative for vaginal discharge and vaginal pain.  Musculoskeletal: Negative for back pain.  Neurological: Positive for tremors and weakness. Negative for dizziness, light-headedness, numbness and headaches.  Psychiatric/Behavioral: Positive for agitation. Negative for suicidal ideas and sleep disturbance. The patient is nervous/anxious.     History Past Medical History  Diagnosis Date  . Crohn disease ([place])   . SLE (systemic lupus erythematosus) ([place])   . Fibromyalgia   . Chronic interstitial cystitis   . Migraines   . Anal fissure   . Anxiety   . Pneumonia 2007    She has past surgical history that includes Cesarean section; Knee arthroscopy; Wrist surgery; Oophorectomy; and Bladder surgery.   Her family history includes Breast cancer in her maternal aunt, maternal grandmother, and mother; Irritable bowel syndrome in her sister; Lung cancer in her father; Prostate cancer in her maternal grandfather. There is no history of Colon cancer, Colon polyps, Esophageal  cancer, Kidney disease, or Gallbladder disease.She reports that she has never smoked. She has never used smokeless tobacco. She reports that she does not drink alcohol or use illicit drugs.  Current Outpatient Prescriptions on File Prior to Visit  Medication Sig Dispense Refill  . ALPRAZolam (XANAX) 0.5 MG tablet TAKE 1 TABLET BY MOUTH THREE TIMES DAILY AS NEEDED 90 tablet 0  . OGESTREL 0.5-50 MG-MCG tablet Take 1 tablet by mouth daily.    Marland Kitchen tiZANidine (ZANAFLEX) 4 MG tablet Take 4 mg by mouth as needed.     . topiramate (TOPAMAX) 200 MG tablet Take 300 mg by mouth daily. Pt taking 1 1/2 tablets, by mouth, every day    . traMADol (ULTRAM) 50 MG tablet 1-2 po q6 hr prn pain    . traZODone (DESYREL) 50 MG tablet 1-2 po qhs     No current facility-administered medications on file prior to visit.     Objective:  Objective Physical Exam  Constitutional: She is oriented to person, place, and time. She appears well-developed and well-nourished.  HENT:  Head: Normocephalic and atraumatic.  Eyes: Conjunctivae and EOM are normal.  Neck: Normal range of motion. Neck supple. No JVD present. Carotid bruit is not present. No thyromegaly present.  Cardiovascular: Normal rate, regular rhythm and normal heart sounds.   No murmur heard. Pulmonary/Chest: Effort normal and breath sounds normal. No respiratory distress. She has no wheezes. She has no rales. She exhibits no tenderness.  Musculoskeletal: She exhibits no edema.  Neurological: She is alert and oriented to person, place, and time.  Psychiatric: She has a normal mood and affect. Her behavior is normal. Judgment and thought content normal.  Nursing note  and vitals reviewed.  BP 108/68 mmHg  Pulse 75  Temp(Src) 99.1 F (37.3 C) (Oral)  Wt 120 lb 3.2 oz (54.522 kg)  SpO2 99% Wt Readings from Last 3 Encounters:  07/11/15 120 lb 3.2 oz (54.522 kg)  04/13/15 123 lb 9.6 oz (56.065 kg)  11/22/14 110 lb 6 oz (50.066 kg)     Lab Results    Component Value Date   WBC 8.3 11/22/2014   HGB 14.2 11/22/2014   HCT 41.9 11/22/2014   PLT 303.0 11/22/2014   GLUCOSE 105* 11/22/2014   ALT 17 11/22/2014   AST 18 11/22/2014   NA 139 11/22/2014   K 3.6 11/22/2014   CL 109 11/22/2014   CREATININE 1.08 11/22/2014   BUN 9 11/22/2014   CO2 25 11/22/2014   TSH 0.48 03/05/2010    No results found.   Assessment & Plan:  Plan I have discontinued Ms. Pitre's nystatin and fluticasone. I have also changed her buPROPion. Additionally, I am having her maintain her ALPRAZolam, tiZANidine, OGESTREL, topiramate, traZODone, traMADol, and escitalopram.  Meds ordered this encounter  Medications  . escitalopram (LEXAPRO) 10 MG tablet    Sig: Take 1 tablet (10 mg total) by mouth daily.    Dispense:  90 tablet    Refill:  1  . buPROPion (WELLBUTRIN XL) 300 MG 24 hr tablet    Sig: Take 1 tablet (300 mg total) by mouth daily.    Dispense:  90 tablet    Refill:  3    Problem List Items Addressed This Visit    Migraines    Signigicant improvement with topamax      Relevant Medications   escitalopram (LEXAPRO) 10 MG tablet   buPROPion (WELLBUTRIN XL) 300 MG 24 hr tablet   Memory loss of unknown cause    ? From topamax Pt will dec dose slightly and see if symptoms resolve But she is requesting a referral to neuro      Anxiety state    Improved with meds Recheck 3 months      Relevant Medications   escitalopram (LEXAPRO) 10 MG tablet   buPROPion (WELLBUTRIN XL) 300 MG 24 hr tablet    Other Visit Diagnoses    Generalized anxiety disorder    -  Primary    Relevant Medications    escitalopram (LEXAPRO) 10 MG tablet    buPROPion (WELLBUTRIN XL) 300 MG 24 hr tablet    Tremor        Relevant Orders    Ambulatory referral to Neurology    Need for prophylactic vaccination and inoculation against influenza        Relevant Orders    Flu Vaccine QUAD 36+ mos PF IM (Fluarix & Fluzone Quad PF) (Completed)       Follow-up: Return in  about 3 months (around 10/11/2015), or if symptoms worsen or fail to improve.  Garnet Koyanagi, DO

## 2015-08-24 ENCOUNTER — Encounter: Payer: Self-pay | Admitting: Family Medicine

## 2015-08-29 NOTE — Telephone Encounter (Signed)
Spoke with patient and she said that her symptoms have not improved and she is getting concerned. I made her aware I would put her in on Thursday and she verbalized understanding and agreed to the apt, I also made her aware that I would have the referral coordinator check the status of the referral and we will get back to her on tomorrow. She verbalized understanding and has agreed.      KP   Would one of you guys check the status of the Neuro referral.    KP

## 2015-08-31 ENCOUNTER — Ambulatory Visit: Payer: Federal, State, Local not specified - PPO | Admitting: Family Medicine

## 2015-09-01 ENCOUNTER — Ambulatory Visit: Payer: Federal, State, Local not specified - PPO | Admitting: Family Medicine

## 2015-09-04 ENCOUNTER — Ambulatory Visit (INDEPENDENT_AMBULATORY_CARE_PROVIDER_SITE_OTHER): Payer: Federal, State, Local not specified - PPO | Admitting: Neurology

## 2015-09-04 ENCOUNTER — Encounter: Payer: Self-pay | Admitting: Neurology

## 2015-09-04 VITALS — BP 118/70 | HR 83 | Ht 64.0 in | Wt 126.0 lb

## 2015-09-04 DIAGNOSIS — F411 Generalized anxiety disorder: Secondary | ICD-10-CM

## 2015-09-04 DIAGNOSIS — R413 Other amnesia: Secondary | ICD-10-CM

## 2015-09-04 DIAGNOSIS — T50905A Adverse effect of unspecified drugs, medicaments and biological substances, initial encounter: Secondary | ICD-10-CM

## 2015-09-04 DIAGNOSIS — T887XXA Unspecified adverse effect of drug or medicament, initial encounter: Secondary | ICD-10-CM

## 2015-09-04 NOTE — Progress Notes (Signed)
Note routed to Dr Domingo Cocking.

## 2015-09-04 NOTE — Progress Notes (Signed)
Sheila [name] was seen today in neurologic consultation at the request of Garnet Koyanagi, DO.   The patient presents today for neurologic consultation due to memory loss, dropping objects and tremor.  I have reviewed prior records made available to me.  The patient has a history of generalized anxiety, fibromyalgia, Crohn's disease, SLE and migraine headache.  She had been on Topamax for migraine headache for about 3 years when she began to experience memory change, word finding trouble and tremor.  It was felt that the Topamax (prescribed by the [facility] with Dr. Domingo Cocking) was contributing to the symptoms and her dosage was decreased from 335m to 200 mg over the last few months.  While she believes that the tremor has gotten better and is much less bothersome, she thinks that the memory change is not getting better (perhaps getting worse) and she is frustrated by this. She states that she is having difficulty organizing thoughts and will forget what she is trying to say.  She thinks that is going on for months.  She is finding herself stuttering in attempting to find words and is embarrassed with this.  She will forget various conversations that she had.  She has had a few falls out of the shower over the last weeks and has dropped objects out of the hands.  She uses the term "black out" to describe this but a "black out" is not described as a passing out spell as she will state that she has a "black out" while driving and will drive from one place to another and not remember how she got there.  A "black out" is described as a period of memory lapse.    Neuroimaging has not previously been performed.    ALLERGIES:   Allergies  Allergen Reactions  . Ampicillin   . Fentanyl Citrate     REACTION: vomiting, fever  . Penicillins     CURRENT MEDICATIONS:  Outpatient Encounter Prescriptions as of 09/04/2015  Medication Sig  . ALPRAZolam (XANAX) 0.5 MG tablet TAKE 1 TABLET BY MOUTH  THREE TIMES DAILY AS NEEDED  . buPROPion (WELLBUTRIN XL) 300 MG 24 hr tablet Take 1 tablet (300 mg total) by mouth daily.  . OGESTREL 0.5-50 MG-MCG tablet Take 1 tablet by mouth daily.  .Marland KitchentiZANidine (ZANAFLEX) 4 MG tablet Take 4 mg by mouth as needed.   . topiramate (TOPAMAX) 200 MG tablet Take 200 mg by mouth daily. Pt taking 1 1/2 tablets, by mouth, every day  . traMADol (ULTRAM) 50 MG tablet 1-2 po q6 hr prn pain  . traZODone (DESYREL) 50 MG tablet 1-2 po qhs  . [DISCONTINUED] escitalopram (LEXAPRO) 10 MG tablet Take 1 tablet (10 mg total) by mouth daily.   No facility-administered encounter medications on file as of 09/04/2015.    PAST MEDICAL HISTORY:   Past Medical History  Diagnosis Date  . Crohn disease ([place]   . [place]matosus) [place]   . Fibromyalgia   . Chronic interstitial cystitis   . Migraines   . Anal fissure   . Anxiety   . Pneumonia 2007    PAST SURGICAL HISTORY:   Past Surgical History  Procedure Laterality Date  . Cesarean section    . Knee arthroscopy    . Wrist surgery    . Oophorectomy      right  . Bladder surgery    . Tonsillectomy      SOCIAL HISTORY:   Social History  Social History  . Marital Status: Married    Spouse Name: N/A  . Number of Children: 2  . Years of Education: N/A   Occupational History  . Preschool teacher    Social History Main Topics  . Smoking status: Never Smoker   . Smokeless tobacco: Never Used  . Alcohol Use: No  . Drug Use: No  . Sexual Activity: Not on file   Other Topics Concern  . Not on file   Social History Narrative    FAMILY HISTORY:   Family Status  Relation Status Death Age  . Mother Alive     breast cancer  . Father Deceased     lung cancer  . Sister Alive     ministokes, heart problems  . Son Alive     healthy  . Son Alive     healthy    ROS:  A complete 10 system review of systems was obtained and was unremarkable apart from what is mentioned  above.  PHYSICAL EXAMINATION:    VITALS:   Filed Vitals:   09/04/15 0941  BP: 118/70  Pulse: 83  Height: 5' 4"  (1.626 m)  Weight: 126 lb (57.153 kg)    GEN:  Normal appears female in no acute distress.  Appears stated age.  Appears generally anxious.   HEENT:  Normocephalic, atraumatic. The mucous membranes are moist. The superficial temporal arteries are without ropiness or tenderness. Cardiovascular: Regular rate and rhythm. Lungs: Clear to auscultation bilaterally.  Initially, she is tachypneic but as anxiety gets better as the examination proceeds, so does rate of breathing and becomes normal. Neck/Heme: There are no carotid bruits noted bilaterally.  NEUROLOGICAL: Orientation:   Hominy Cognitive Assessment  09/04/2015  Visuospatial/ Executive (0/5) 4  Naming (0/3) 3  Attention: Read list of digits (0/2) 2  Attention: Read list of letters (0/1) 1  Attention: Serial 7 subtraction starting at 100 (0/3) 1  Language: Repeat phrase (0/2) 2  Language : Fluency (0/1) 1  Abstraction (0/2) 2  Delayed Recall (0/5) 4  Orientation (0/6) 6  Total 26  Adjusted Score (based on education) 26   Cranial nerves: There is good facial symmetry. The pupils are equal round and reactive to light bilaterally. Fundoscopic exam reveals clear disc margins bilaterally. Extraocular muscles are intact and visual fields are full to confrontational testing. Speech is fluent and clear; it is initially mildly stuttering in quality but as she becomes more comfortable words become more fluent. Soft palate rises symmetrically and there is no tongue deviation. Hearing is intact to conversational tone. Tone: Tone is good throughout. Sensation: Sensation is intact to light touch and pinprick throughout (facial, trunk, extremities). Vibration is intact at the bilateral big toe. There is no extinction with double simultaneous stimulation. There is no sensory dermatomal level identified. Coordination:  The patient  has no difficulty with RAM's or FNF bilaterally. Motor: Strength is 5/5 in the bilateral upper and lower extremities.  Shoulder shrug is equal and symmetric. There is no pronator drift.  There are no fasciculations noted. DTR's: Deep tendon reflexes are 1/4 at the bilateral biceps, triceps, brachioradialis, patella and achilles.  Plantar responses are downgoing bilaterally. Gait and Station: The patient is able to ambulate without difficulty. The patient is able to heel toe walk without any difficulty. The patient is able to ambulate in a tandem fashion. The patient is able to stand in the Romberg position. Abnormal movements: She does have mild tremor of the outstretched hands.  IMPRESSION/PLAN  1. Memory loss  -Most of this is likely due to topamax.   She is describing classic word finding troubles and troubles with putting together sentences.  I do think that this is exacerbated by anxiety as I saw in the room that when she was anxious at the beginning her word finding was much worse than at the end of the visit.  I see no evidence of a neurodegenerative process.  I told her that I would first recommend that she follow-up with the prescribing physician of Topamax (Dr. Domingo Cocking) and see if he could find an alternative option for the medication (perhaps Zonegran or another alternative that she has not yet tried).  I will see if I can help him facilitate that appointment for her.  I really think that most of her symptoms will go away if the Topamax is tapered further or discontinued.  If not, then I told her to come back and we would evaluate her further, first starting with an MRI of the brain.  She expressed understanding and willingness and will let me know.  For now, we will follow-up on an as-needed basis.  Her neurological exam today was nonfocal and nonlateralizing.  Much greater than 50% of this visit was spent in counseling with the patient.

## 2015-09-04 NOTE — Patient Instructions (Signed)
1.  You may want to try to talk to Dr. Domingo Cocking about Zonegran to see if that is an option for headache

## 2015-12-03 ENCOUNTER — Other Ambulatory Visit: Payer: Self-pay | Admitting: Family Medicine

## 2015-12-04 NOTE — Telephone Encounter (Signed)
Medication filled to pharmacy as requested.   

## 2015-12-18 DIAGNOSIS — Z130 Encounter for screening for diseases of the blood and blood-forming organs and certain disorders involving the immune mechanism: Secondary | ICD-10-CM | POA: Diagnosis not present

## 2015-12-18 DIAGNOSIS — Z13220 Encounter for screening for lipoid disorders: Secondary | ICD-10-CM | POA: Diagnosis not present

## 2015-12-18 DIAGNOSIS — N9419 Other specified dyspareunia: Secondary | ICD-10-CM | POA: Diagnosis not present

## 2015-12-18 DIAGNOSIS — Z1321 Encounter for screening for nutritional disorder: Secondary | ICD-10-CM | POA: Diagnosis not present

## 2015-12-18 DIAGNOSIS — Z1329 Encounter for screening for other suspected endocrine disorder: Secondary | ICD-10-CM | POA: Diagnosis not present

## 2015-12-21 DIAGNOSIS — R8290 Unspecified abnormal findings in urine: Secondary | ICD-10-CM | POA: Diagnosis not present

## 2015-12-21 DIAGNOSIS — N3010 Interstitial cystitis (chronic) without hematuria: Secondary | ICD-10-CM | POA: Diagnosis not present

## 2015-12-21 DIAGNOSIS — K582 Mixed irritable bowel syndrome: Secondary | ICD-10-CM | POA: Diagnosis not present

## 2015-12-21 DIAGNOSIS — N9489 Other specified conditions associated with female genital organs and menstrual cycle: Secondary | ICD-10-CM | POA: Diagnosis not present

## 2015-12-21 DIAGNOSIS — M797 Fibromyalgia: Secondary | ICD-10-CM | POA: Diagnosis not present

## 2016-01-10 DIAGNOSIS — K080 Exfoliation of teeth due to systemic causes: Secondary | ICD-10-CM | POA: Diagnosis not present

## 2016-02-05 DIAGNOSIS — N941 Unspecified dyspareunia: Secondary | ICD-10-CM | POA: Diagnosis not present

## 2016-02-05 DIAGNOSIS — M62838 Other muscle spasm: Secondary | ICD-10-CM | POA: Diagnosis not present

## 2016-02-05 DIAGNOSIS — R278 Other lack of coordination: Secondary | ICD-10-CM | POA: Diagnosis not present

## 2016-02-05 DIAGNOSIS — M6281 Muscle weakness (generalized): Secondary | ICD-10-CM | POA: Diagnosis not present

## 2016-02-20 DIAGNOSIS — G43839 Menstrual migraine, intractable, without status migrainosus: Secondary | ICD-10-CM | POA: Diagnosis not present

## 2016-02-20 DIAGNOSIS — G43719 Chronic migraine without aura, intractable, without status migrainosus: Secondary | ICD-10-CM | POA: Diagnosis not present

## 2016-03-01 DIAGNOSIS — J019 Acute sinusitis, unspecified: Secondary | ICD-10-CM | POA: Diagnosis not present

## 2016-04-05 DIAGNOSIS — R102 Pelvic and perineal pain: Secondary | ICD-10-CM | POA: Diagnosis not present

## 2016-04-05 DIAGNOSIS — R3915 Urgency of urination: Secondary | ICD-10-CM | POA: Diagnosis not present

## 2016-04-05 DIAGNOSIS — R351 Nocturia: Secondary | ICD-10-CM | POA: Diagnosis not present

## 2016-04-05 DIAGNOSIS — M6281 Muscle weakness (generalized): Secondary | ICD-10-CM | POA: Diagnosis not present

## 2016-04-30 DIAGNOSIS — M62838 Other muscle spasm: Secondary | ICD-10-CM | POA: Diagnosis not present

## 2016-04-30 DIAGNOSIS — R351 Nocturia: Secondary | ICD-10-CM | POA: Diagnosis not present

## 2016-04-30 DIAGNOSIS — R278 Other lack of coordination: Secondary | ICD-10-CM | POA: Diagnosis not present

## 2016-04-30 DIAGNOSIS — M6281 Muscle weakness (generalized): Secondary | ICD-10-CM | POA: Diagnosis not present

## 2016-05-01 DIAGNOSIS — H43393 Other vitreous opacities, bilateral: Secondary | ICD-10-CM | POA: Diagnosis not present

## 2016-05-07 DIAGNOSIS — M62838 Other muscle spasm: Secondary | ICD-10-CM | POA: Diagnosis not present

## 2016-05-07 DIAGNOSIS — N3010 Interstitial cystitis (chronic) without hematuria: Secondary | ICD-10-CM | POA: Diagnosis not present

## 2016-05-07 DIAGNOSIS — M6281 Muscle weakness (generalized): Secondary | ICD-10-CM | POA: Diagnosis not present

## 2016-05-07 DIAGNOSIS — R278 Other lack of coordination: Secondary | ICD-10-CM | POA: Diagnosis not present

## 2016-05-16 DIAGNOSIS — M6281 Muscle weakness (generalized): Secondary | ICD-10-CM | POA: Diagnosis not present

## 2016-05-16 DIAGNOSIS — R278 Other lack of coordination: Secondary | ICD-10-CM | POA: Diagnosis not present

## 2016-05-16 DIAGNOSIS — N3010 Interstitial cystitis (chronic) without hematuria: Secondary | ICD-10-CM | POA: Diagnosis not present

## 2016-05-16 DIAGNOSIS — M62838 Other muscle spasm: Secondary | ICD-10-CM | POA: Diagnosis not present

## 2016-05-22 ENCOUNTER — Other Ambulatory Visit: Payer: Self-pay | Admitting: Family Medicine

## 2016-05-24 DIAGNOSIS — N3010 Interstitial cystitis (chronic) without hematuria: Secondary | ICD-10-CM | POA: Diagnosis not present

## 2016-05-24 DIAGNOSIS — M62838 Other muscle spasm: Secondary | ICD-10-CM | POA: Diagnosis not present

## 2016-05-24 DIAGNOSIS — R278 Other lack of coordination: Secondary | ICD-10-CM | POA: Diagnosis not present

## 2016-05-24 DIAGNOSIS — M6281 Muscle weakness (generalized): Secondary | ICD-10-CM | POA: Diagnosis not present

## 2016-06-06 DIAGNOSIS — N3010 Interstitial cystitis (chronic) without hematuria: Secondary | ICD-10-CM | POA: Diagnosis not present

## 2016-06-06 DIAGNOSIS — M62838 Other muscle spasm: Secondary | ICD-10-CM | POA: Diagnosis not present

## 2016-06-06 DIAGNOSIS — R278 Other lack of coordination: Secondary | ICD-10-CM | POA: Diagnosis not present

## 2016-06-06 DIAGNOSIS — M6281 Muscle weakness (generalized): Secondary | ICD-10-CM | POA: Diagnosis not present

## 2016-06-12 ENCOUNTER — Encounter: Payer: Self-pay | Admitting: *Deleted

## 2016-06-16 ENCOUNTER — Other Ambulatory Visit: Payer: Self-pay | Admitting: Family Medicine

## 2016-06-17 NOTE — Telephone Encounter (Signed)
Please schedule pt for annual  Thanks [facility]

## 2016-06-24 ENCOUNTER — Telehealth: Payer: Self-pay | Admitting: Family Medicine

## 2016-06-24 NOTE — Telephone Encounter (Signed)
Patient states she has her CPE with her OB/GYN. Scheduled to come in for a medication follow up in November but will need more Wellbutrin before the appointment.

## 2016-07-19 NOTE — Telephone Encounter (Signed)
error:[id number] ° °

## 2016-07-29 ENCOUNTER — Ambulatory Visit (INDEPENDENT_AMBULATORY_CARE_PROVIDER_SITE_OTHER): Payer: Federal, State, Local not specified - PPO | Admitting: Family Medicine

## 2016-07-29 ENCOUNTER — Other Ambulatory Visit: Payer: Self-pay | Admitting: Family Medicine

## 2016-07-29 VITALS — BP 110/75 | HR 85 | Temp 98.6°F | Ht 64.0 in | Wt 123.6 lb

## 2016-07-29 DIAGNOSIS — R079 Chest pain, unspecified: Secondary | ICD-10-CM | POA: Diagnosis not present

## 2016-07-29 DIAGNOSIS — M797 Fibromyalgia: Secondary | ICD-10-CM

## 2016-07-29 DIAGNOSIS — F418 Other specified anxiety disorders: Secondary | ICD-10-CM

## 2016-07-29 MED ORDER — ALPRAZOLAM 0.5 MG PO TABS: ORAL_TABLET | ORAL | 0 refills | Status: DC

## 2016-07-29 MED ORDER — DULOXETINE HCL 30 MG PO CPEP: 30.0000 mg | ORAL_CAPSULE | Freq: Every day | ORAL | 0 refills | Status: DC

## 2016-07-29 NOTE — Progress Notes (Signed)
Pre visit review using our clinic review tool, if applicable. No additional management support is needed unless otherwise documented below in the visit note. 

## 2016-07-29 NOTE — Progress Notes (Signed)
Patient ID: Sheila [name], female    DOB: Jul 28, 1966  Age: 50 y.o. MRN: [id number]    Subjective:  Subjective  HPI [name] presents for increasing anxiety and chest pain--- similar to what she had before    She is under a lot of stress.    Review of Systems  Constitutional: Negative for activity change, appetite change, fatigue and unexpected weight change.  Respiratory: Negative for cough and shortness of breath.   Cardiovascular: Negative for chest pain and palpitations.  Psychiatric/Behavioral: Positive for sleep disturbance. Negative for behavioral problems and dysphoric mood. The patient is nervous/anxious.     History Past Medical History:  Diagnosis Date  . Anal fissure   . Anxiety   . Chronic interstitial cystitis   . Crohn disease ([place])   . Fibromyalgia   . Migraines   . Pneumonia 2007  . SLE (systemic lupus erythematosus) ([place])     She has a past surgical history that includes Cesarean section; Knee arthroscopy; Wrist surgery; Oophorectomy; Bladder surgery; and Tonsillectomy.   Her family history includes Breast cancer in her maternal aunt, maternal grandmother, and mother; Irritable bowel syndrome in her sister; Lung cancer in her father; Prostate cancer in her maternal grandfather.She reports that she has never smoked. She has never used smokeless tobacco. She reports that she does not drink alcohol or use drugs.  Current Outpatient Prescriptions on File Prior to Visit  Medication Sig Dispense Refill  . OGESTREL 0.5-50 MG-MCG tablet Take 1 tablet by mouth daily.    Marland Kitchen tiZANidine (ZANAFLEX) 4 MG tablet Take 4 mg by mouth as needed.     . topiramate (TOPAMAX) 200 MG tablet Take 100 mg by mouth daily. Pt taking 1 1/2 tablets, by mouth, every day    . traMADol (ULTRAM) 50 MG tablet 1-2 po q6 hr prn pain    . traZODone (DESYREL) 50 MG tablet 1-2 po qhs     No current facility-administered medications on file prior to visit.      Objective:  Objective    Physical Exam  Constitutional: She is oriented to person, place, and time. She appears well-developed and well-nourished.  HENT:  Head: Normocephalic and atraumatic.  Eyes: Conjunctivae and EOM are normal.  Neck: Normal range of motion. Neck supple. No JVD present. Carotid bruit is not present. No thyromegaly present.  Cardiovascular: Normal rate, regular rhythm and normal heart sounds.   No murmur heard. Pulmonary/Chest: Effort normal and breath sounds normal. No respiratory distress. She has no wheezes. She has no rales. She exhibits no tenderness.  Musculoskeletal: She exhibits no edema.  Neurological: She is alert and oriented to person, place, and time.  Psychiatric: Judgment and thought content normal. Her mood appears anxious. Thought content is not paranoid and not delusional. Cognition and memory are normal. She exhibits a depressed mood. She expresses no homicidal and no suicidal ideation. She expresses no suicidal plans and no homicidal plans.  Nursing note and vitals reviewed.  BP 110/75 (BP Location: Left Arm, Cuff Size: Small)   Pulse 85   Temp 98.6 F (37 C) (Oral)   Ht 5' 4"  (1.626 m)   Wt 123 lb 9.6 oz (56.1 kg)   SpO2 99%   BMI 21.22 kg/m  Wt Readings from Last 3 Encounters:  07/29/16 123 lb 9.6 oz (56.1 kg)  09/04/15 126 lb (57.2 kg)  07/11/15 120 lb 3.2 oz (54.5 kg)     Lab Results  Component Value Date   WBC 8.3  11/22/2014   HGB 14.2 11/22/2014   HCT 41.9 11/22/2014   PLT 303.0 11/22/2014   GLUCOSE 105 (H) 11/22/2014   ALT 17 11/22/2014   AST 18 11/22/2014   NA 139 11/22/2014   K 3.6 11/22/2014   CL 109 11/22/2014   CREATININE 1.08 11/22/2014   BUN 9 11/22/2014   CO2 25 11/22/2014   TSH 0.48 03/05/2010   ekg-- NSR No results found.   Assessment & Plan:  Plan  I have discontinued Ms. Poullard's buPROPion. I am also having her start on DULoxetine. Additionally, I am having her maintain her tiZANidine, OGESTREL, topiramate, traZODone, traMADol, and  ALPRAZolam.  Meds ordered this encounter  Medications  . DULoxetine (CYMBALTA) 30 MG capsule    Sig: Take 1 capsule (30 mg total) by mouth daily. After 1 week take 2 qd    Dispense:  60 capsule    Refill:  0  . ALPRAZolam (XANAX) 0.5 MG tablet    Sig: TAKE 1 TABLET BY MOUTH THREE TIMES DAILY AS NEEDED    Dispense:  90 tablet    Refill:  0    Problem List Items Addressed This Visit    None    Visit Diagnoses    Fibromyalgia    -  Primary   Relevant Medications   DULoxetine (CYMBALTA) 30 MG capsule   Depression with anxiety       Relevant Medications   DULoxetine (CYMBALTA) 30 MG capsule   ALPRAZolam (XANAX) 0.5 MG tablet   Chest pain, unspecified type       Relevant Orders   EKG 12-Lead (Completed)      Follow-up: Return in about 4 weeks (around 08/26/2016) for anxiety/ depression.  Ann Held, DO

## 2016-07-29 NOTE — Patient Instructions (Signed)
Generalized Anxiety Disorder Generalized anxiety disorder (GAD) is a mental disorder. It interferes with life functions, including relationships, work, and school. GAD is different from normal anxiety, which everyone experiences at some point in their lives in response to specific life events and activities. Normal anxiety actually helps us prepare for and get through these life events and activities. Normal anxiety goes away after the event or activity is over.  GAD causes anxiety that is not necessarily related to specific events or activities. It also causes excess anxiety in proportion to specific events or activities. The anxiety associated with GAD is also difficult to control. GAD can vary from mild to severe. People with severe GAD can have intense waves of anxiety with physical symptoms (panic attacks).  SYMPTOMS The anxiety and worry associated with GAD are difficult to control. This anxiety and worry are related to many life events and activities and also occur more days than not for 6 months or longer. People with GAD also have three or more of the following symptoms (one or more in children):  Restlessness.   Fatigue.  Difficulty concentrating.   Irritability.  Muscle tension.  Difficulty sleeping or unsatisfying sleep. DIAGNOSIS GAD is diagnosed through an assessment by your health care provider. Your health care provider will ask you questions aboutyour mood,physical symptoms, and events in your life. Your health care provider may ask you about your medical history and use of alcohol or drugs, including prescription medicines. Your health care provider may also do a physical exam and blood tests. Certain medical conditions and the use of certain substances can cause symptoms similar to those associated with GAD. Your health care provider may refer you to a mental health specialist for further evaluation. TREATMENT The following therapies are usually used to treat GAD:    Medication. Antidepressant medication usually is prescribed for long-term daily control. Antianxiety medicines may be added in severe cases, especially when panic attacks occur.   Talk therapy (psychotherapy). Certain types of talk therapy can be helpful in treating GAD by providing support, education, and guidance. A form of talk therapy called cognitive behavioral therapy can teach you healthy ways to think about and react to daily life events and activities.  Stress managementtechniques. These include yoga, meditation, and exercise and can be very helpful when they are practiced regularly. A mental health specialist can help determine which treatment is best for you. Some people see improvement with one therapy. However, other people require a combination of therapies.   This information is not intended to replace advice given to you by your health care provider. Make sure you discuss any questions you have with your health care provider.   Document Released: 12/28/2012 Document Revised: 09/23/2014 Document Reviewed: 12/28/2012 Elsevier Interactive Patient Education 2016 Elsevier Inc.  

## 2016-08-04 ENCOUNTER — Encounter: Payer: Self-pay | Admitting: Family Medicine

## 2016-08-04 DIAGNOSIS — F418 Other specified anxiety disorders: Secondary | ICD-10-CM | POA: Insufficient documentation

## 2016-08-04 DIAGNOSIS — M797 Fibromyalgia: Secondary | ICD-10-CM | POA: Insufficient documentation

## 2016-08-04 NOTE — Assessment & Plan Note (Signed)
Restart cymbalta Stop wellutrin

## 2016-08-04 NOTE — Assessment & Plan Note (Signed)
Restart cymbalta

## 2016-08-19 DIAGNOSIS — K080 Exfoliation of teeth due to systemic causes: Secondary | ICD-10-CM | POA: Diagnosis not present

## 2016-08-25 ENCOUNTER — Other Ambulatory Visit: Payer: Self-pay | Admitting: Family Medicine

## 2016-08-25 DIAGNOSIS — M797 Fibromyalgia: Secondary | ICD-10-CM

## 2016-08-25 DIAGNOSIS — F418 Other specified anxiety disorders: Secondary | ICD-10-CM

## 2016-08-27 ENCOUNTER — Encounter: Payer: Self-pay | Admitting: Family Medicine

## 2016-08-27 ENCOUNTER — Ambulatory Visit (INDEPENDENT_AMBULATORY_CARE_PROVIDER_SITE_OTHER): Payer: Federal, State, Local not specified - PPO | Admitting: Family Medicine

## 2016-08-27 ENCOUNTER — Other Ambulatory Visit: Payer: Self-pay | Admitting: Family Medicine

## 2016-08-27 VITALS — BP 100/60 | HR 83 | Temp 99.2°F | Resp 16 | Ht 64.0 in | Wt 123.4 lb

## 2016-08-27 DIAGNOSIS — G43009 Migraine without aura, not intractable, without status migrainosus: Secondary | ICD-10-CM | POA: Insufficient documentation

## 2016-08-27 DIAGNOSIS — F32A Depression, unspecified: Secondary | ICD-10-CM

## 2016-08-27 DIAGNOSIS — M797 Fibromyalgia: Secondary | ICD-10-CM

## 2016-08-27 DIAGNOSIS — F418 Other specified anxiety disorders: Secondary | ICD-10-CM

## 2016-08-27 DIAGNOSIS — F329 Major depressive disorder, single episode, unspecified: Secondary | ICD-10-CM | POA: Diagnosis not present

## 2016-08-27 MED ORDER — TIZANIDINE HCL 4 MG PO TABS: ORAL_TABLET | ORAL | 1 refills | Status: DC

## 2016-08-27 MED ORDER — DULOXETINE HCL 60 MG PO CPEP: 60.0000 mg | ORAL_CAPSULE | Freq: Two times a day (BID) | ORAL | 5 refills | Status: DC

## 2016-08-27 MED ORDER — TOPIRAMATE 100 MG PO TABS: ORAL_TABLET | ORAL | 5 refills | Status: DC

## 2016-08-27 NOTE — Progress Notes (Signed)
Pre visit review using our clinic review tool, if applicable. No additional management support is needed unless otherwise documented below in the visit note. 

## 2016-08-27 NOTE — Assessment & Plan Note (Signed)
con't muscle relaxer and topamax rto 6 months or sooner prn

## 2016-08-27 NOTE — Patient Instructions (Signed)
Persistent Depressive Disorder Persistent depressive disorder (PDD) is a mental health condition. PDD causes symptoms of low-level depression for 2 years or longer. It may also be called long-term (chronic) depression or dysthymia. PDD may include episodes of more severe depression that last for about 2 weeks (major depressive disorder or MDD). PDD can affect the way you think, feel, and sleep. This condition may also affect your relationships. You may be more likely to get sick if you have PDD. Symptoms of PDD occur for most of the day and may include:  Feeling tired (fatigue).  Low energy.  Eating too much or too little.  Sleeping too much or too little.  Feeling restless or agitated.  Feeling hopeless.  Feeling worthless or guilty.  Feeling worried or nervous (anxiety).  Trouble concentrating or making decisions.  Low self-esteem.  A negative way of looking at things ([place]).  Not being able to have fun or feel pleasure.  Avoiding interacting with people.  Getting angry or annoyed easily (irritability).  Acting aggressive or angry. Follow these instructions at home: Activity  Go back to your normal activities as told by your doctor.  Exercise regularly as told by your doctor. General instructions  Take over-the-counter and prescription medicines only as told by your doctor.  Do not drink alcohol. Or, limit how much alcohol you drink to no more than 1 drink a day for nonpregnant women and 2 drinks a day for men. One drink equals 12 oz of beer, 5 oz of wine, or 1 oz of hard liquor. Alcohol can affect any antidepressant medicines you are taking. Talk with your doctor about your alcohol use.  Eat a healthy diet and get plenty of sleep.  Find activities that you enjoy each day.  Consider joining a support group. Your doctor may be able to suggest a support group.  Keep all follow-up visits as told by your doctor. This is important. Where to find more  information: [employer] on Mental Illness  www.nami.org [place] National Institute of Mental Health  https://carter.com/ National Suicide Prevention Lifeline  310-538-6210 3098676017). This is free, 24-hour help. Contact a doctor if:  Your symptoms get worse.  You have new symptoms.  You have trouble sleeping or doing your daily activities. Get help right away if:  You self-harm.  You have serious thoughts about hurting yourself or others.  You see, hear, taste, smell, or feel things that are not there (hallucinate). This information is not intended to replace advice given to you by your health care provider. Make sure you discuss any questions you have with your health care provider. Document Released: 08/14/2015 Document Revised: 04/26/2016 Document Reviewed: 04/26/2016 Elsevier Interactive Patient Education  2017 [employer].

## 2016-08-27 NOTE — Assessment & Plan Note (Signed)
Inc cymbalta to 60 mg bid and let us know if symptoms worsen

## 2016-08-27 NOTE — Assessment & Plan Note (Signed)
Inc cymbalta Doing well No suicidal thoughts

## 2016-08-27 NOTE — Progress Notes (Signed)
Patient ID: Sheila [name], female    DOB: 27-Sep-1965  Age: 50 y.o. MRN: [id number]    Subjective:  Subjective  HPI Sheila [name] presents for f/u depression and migraines.  She is doing well with cymbalta but feels it runs out around 2-3 pm.    Review of Systems  Constitutional: Negative for activity change, appetite change, fatigue and unexpected weight change.  Respiratory: Negative for cough and shortness of breath.   Cardiovascular: Negative for chest pain and palpitations.  Psychiatric/Behavioral: Negative for behavioral problems and dysphoric mood. The patient is not nervous/anxious.     History Past Medical History:  Diagnosis Date  . Anal fissure   . Anxiety   . Chronic interstitial cystitis   . Crohn disease ([place])   . Fibromyalgia   . Migraines   . Pneumonia 2007  . SLE (systemic lupus erythematosus) ([place])     She has a past surgical history that includes Cesarean section; Knee arthroscopy; Wrist surgery; Oophorectomy; Bladder surgery; and Tonsillectomy.   Her family history includes Breast cancer in her maternal aunt, maternal grandmother, and mother; Irritable bowel syndrome in her sister; Lung cancer in her father; Prostate cancer in her maternal grandfather.She reports that she has never smoked. She has never used smokeless tobacco. She reports that she does not drink alcohol or use drugs.  Current Outpatient Prescriptions on File Prior to Visit  Medication Sig Dispense Refill  . ALPRAZolam (XANAX) 0.5 MG tablet TAKE 1 TABLET BY MOUTH THREE TIMES DAILY AS NEEDED 90 tablet 0  . DULoxetine (CYMBALTA) 30 MG capsule TAKE 1 CAPSULE BY MOUTH EVERY DAY FOR 1 WEEK, THEN 2 CAPSULES EVERY DAY 60 capsule 0  . OGESTREL 0.5-50 MG-MCG tablet Take 1 tablet by mouth daily.    . traMADol (ULTRAM) 50 MG tablet 1-2 po q6 hr prn pain    . traZODone (DESYREL) 50 MG tablet 1-2 po qhs     No current facility-administered medications on file prior to visit.      Objective:    Objective  Physical Exam  Constitutional: She is oriented to person, place, and time. She appears well-developed and well-nourished.  HENT:  Head: Normocephalic and atraumatic.  Eyes: Conjunctivae and EOM are normal.  Neck: Normal range of motion. Neck supple. No JVD present. Carotid bruit is not present. No thyromegaly present.  Cardiovascular: Normal rate, regular rhythm and normal heart sounds.   No murmur heard. Pulmonary/Chest: Effort normal and breath sounds normal. No respiratory distress. She has no wheezes. She has no rales. She exhibits no tenderness.  Musculoskeletal: She exhibits no edema.  Neurological: She is alert and oriented to person, place, and time.  Psychiatric: She has a normal mood and affect. Her behavior is normal. Judgment and thought content normal.  Nursing note and vitals reviewed.  BP 100/60 (BP Location: Left Arm, Patient Position: Sitting, Cuff Size: Normal)   Pulse 83   Temp 99.2 F (37.3 C) (Oral)   Resp 16   Ht 5' 4"  (1.626 m)   Wt 123 lb 6.4 oz (56 kg)   SpO2 97%   BMI 21.18 kg/m  Wt Readings from Last 3 Encounters:  08/27/16 123 lb 6.4 oz (56 kg)  07/29/16 123 lb 9.6 oz (56.1 kg)  09/04/15 126 lb (57.2 kg)     Lab Results  Component Value Date   WBC 8.3 11/22/2014   HGB 14.2 11/22/2014   HCT 41.9 11/22/2014   PLT 303.0 11/22/2014   GLUCOSE 105 (H) 11/22/2014  ALT 17 11/22/2014   AST 18 11/22/2014   NA 139 11/22/2014   K 3.6 11/22/2014   CL 109 11/22/2014   CREATININE 1.08 11/22/2014   BUN 9 11/22/2014   CO2 25 11/22/2014   TSH 0.48 03/05/2010    No results found.   Assessment & Plan:  Plan  I have discontinued Sheila [name]'s topiramate. I have also changed her tiZANidine. Additionally, I am having her start on DULoxetine and topiramate. Lastly, I am having her maintain her OGESTREL, traZODone, traMADol, ALPRAZolam, and DULoxetine.  Meds ordered this encounter  Medications  . DULoxetine (CYMBALTA) 60 MG capsule    Sig:  Take 1 capsule (60 mg total) by mouth 2 (two) times daily.    Dispense:  60 capsule    Refill:  5  . tiZANidine (ZANAFLEX) 4 MG tablet    Sig: 1 po bid prn    Dispense:  60 tablet    Refill:  1  . topiramate (TOPAMAX) 100 MG tablet    Sig: 1 po qd    Dispense:  30 tablet    Refill:  5    Problem List Items Addressed This Visit      Unprioritized   Depression with anxiety    Inc cymbalta Doing well No suicidal thoughts      Fibromyalgia    Inc cymbalta to 60 mg bid and let us know if symptoms worsen      Relevant Medications   DULoxetine (CYMBALTA) 60 MG capsule   Migraine without aura and without status migrainosus, not intractable    con't muscle relaxer and topamax rto 6 months or sooner prn      Relevant Medications   DULoxetine (CYMBALTA) 60 MG capsule   tiZANidine (ZANAFLEX) 4 MG tablet   topiramate (TOPAMAX) 100 MG tablet    Other Visit Diagnoses    Depression, unspecified depression type    -  Primary   Relevant Medications   DULoxetine (CYMBALTA) 60 MG capsule      Follow-up: Return in about 3 months (around 11/25/2016) for depression.  Ann Held, DO

## 2016-09-17 ENCOUNTER — Telehealth: Payer: Self-pay | Admitting: Family Medicine

## 2016-09-17 NOTE — Telephone Encounter (Signed)
Caller name: Relationship to patient: Self Can be reached: [phone]  Pharmacy:  Reason for call: Patient request call back to discuss stopping DULoxetine (CYMBALTA) 60 MG capsule  9 days ago.

## 2016-09-18 NOTE — Telephone Encounter (Signed)
Left message on pt's vm to give the office a call back, in regards to stopping medication Cymbalta. LB

## 2016-09-19 NOTE — Telephone Encounter (Signed)
Reason for call: Patient request call back to discuss stopping DULoxetine (CYMBALTA) 60 MG capsule. Pt state she has been off medication for 2 weeks now, and she need something to replace medication. Pt state medication gave her digestion problem. LB

## 2016-09-19 NOTE — Telephone Encounter (Signed)
We can wean her off and change it for depression but I thought it was helping for migraines and fibromyalgia---- there is not another med that can do all of it.   We would have to wean her off the cymbalta first and then start another med.   Go down to 30 mg for a few weeks and then she can stop it  Then try lexapro 10 mg #30  1 po qd, 2 refills F/u 1 month after starting new med

## 2016-09-20 ENCOUNTER — Other Ambulatory Visit: Payer: Self-pay

## 2016-09-20 DIAGNOSIS — R21 Rash and other nonspecific skin eruption: Secondary | ICD-10-CM | POA: Diagnosis not present

## 2016-09-20 DIAGNOSIS — J019 Acute sinusitis, unspecified: Secondary | ICD-10-CM | POA: Diagnosis not present

## 2016-09-20 DIAGNOSIS — F418 Other specified anxiety disorders: Secondary | ICD-10-CM

## 2016-09-20 MED ORDER — ESCITALOPRAM OXALATE 10 MG PO TABS: 10.0000 mg | ORAL_TABLET | Freq: Every day | ORAL | 2 refills | Status: DC

## 2016-09-20 NOTE — Progress Notes (Signed)
Spoke with pt to inform pt provide would like her to wean off currently medication Cymbalta and start taking new medication Lexapro, also follow up in 1 mth. Pt state she understand medication instructions, and will call back with follow up appointment. Pt had no further questions or concerns. LB

## 2016-09-20 NOTE — Telephone Encounter (Signed)
Spoke with pt to inform pt provide would like her to wean off currently medication Cymbalta and start taking new medication Lexapro, also follow up in 1 mth. Pt state she understand medication instructions, and will call back with follow up appointment. Pt had no further questions or concerns. LB

## 2017-01-08 DIAGNOSIS — Z6821 Body mass index (BMI) 21.0-21.9, adult: Secondary | ICD-10-CM | POA: Diagnosis not present

## 2017-01-08 DIAGNOSIS — Z1231 Encounter for screening mammogram for malignant neoplasm of breast: Secondary | ICD-10-CM | POA: Diagnosis not present

## 2017-01-08 DIAGNOSIS — Z01419 Encounter for gynecological examination (general) (routine) without abnormal findings: Secondary | ICD-10-CM | POA: Diagnosis not present

## 2017-02-10 ENCOUNTER — Other Ambulatory Visit: Payer: Self-pay | Admitting: Family Medicine

## 2017-02-10 DIAGNOSIS — G43009 Migraine without aura, not intractable, without status migrainosus: Secondary | ICD-10-CM

## 2017-02-13 ENCOUNTER — Other Ambulatory Visit: Payer: Self-pay | Admitting: Family Medicine

## 2017-02-13 ENCOUNTER — Encounter: Payer: Self-pay | Admitting: Family Medicine

## 2017-02-13 ENCOUNTER — Ambulatory Visit (INDEPENDENT_AMBULATORY_CARE_PROVIDER_SITE_OTHER): Payer: Federal, State, Local not specified - PPO | Admitting: Family Medicine

## 2017-02-13 VITALS — BP 98/62 | HR 82 | Temp 98.4°F | Resp 16 | Ht 64.0 in | Wt 130.8 lb

## 2017-02-13 DIAGNOSIS — R21 Rash and other nonspecific skin eruption: Secondary | ICD-10-CM | POA: Diagnosis not present

## 2017-02-13 DIAGNOSIS — M797 Fibromyalgia: Secondary | ICD-10-CM

## 2017-02-13 DIAGNOSIS — F332 Major depressive disorder, recurrent severe without psychotic features: Secondary | ICD-10-CM

## 2017-02-13 DIAGNOSIS — F411 Generalized anxiety disorder: Secondary | ICD-10-CM

## 2017-02-13 DIAGNOSIS — K6289 Other specified diseases of anus and rectum: Secondary | ICD-10-CM | POA: Diagnosis not present

## 2017-02-13 DIAGNOSIS — R8299 Other abnormal findings in urine: Secondary | ICD-10-CM | POA: Diagnosis not present

## 2017-02-13 DIAGNOSIS — R82998 Other abnormal findings in urine: Secondary | ICD-10-CM

## 2017-02-13 LAB — CBC WITH DIFFERENTIAL/PLATELET
BASOS PCT: 1.2 % (ref 0.0–3.0)
Basophils Absolute: 0.1 10*3/uL (ref 0.0–0.1)
Eosinophils Absolute: 0.1 10*3/uL (ref 0.0–0.7)
Eosinophils Relative: 1.9 % (ref 0.0–5.0)
HEMATOCRIT: 43.4 % (ref 36.0–46.0)
Hemoglobin: 14.4 g/dL (ref 12.0–15.0)
Lymphocytes Relative: 26.5 % (ref 12.0–46.0)
Lymphs Abs: 2 10*3/uL (ref 0.7–4.0)
MCHC: 33 g/dL (ref 30.0–36.0)
MCV: 95.7 fl (ref 78.0–100.0)
MONOS PCT: 9.5 % (ref 3.0–12.0)
Monocytes Absolute: 0.7 10*3/uL (ref 0.1–1.0)
NEUTROS ABS: 4.6 10*3/uL (ref 1.4–7.7)
Neutrophils Relative %: 60.9 % (ref 43.0–77.0)
PLATELETS: 293 10*3/uL (ref 150.0–400.0)
RBC: 4.54 Mil/uL (ref 3.87–5.11)
RDW: 13.5 % (ref 11.5–15.5)
WBC: 7.6 10*3/uL (ref 4.0–10.5)

## 2017-02-13 LAB — POC URINALSYSI DIPSTICK (AUTOMATED)
Bilirubin, UA: NEGATIVE
GLUCOSE UA: NEGATIVE
Ketones, UA: NEGATIVE
NITRITE UA: NEGATIVE
PROTEIN UA: NEGATIVE
RBC UA: NEGATIVE
Spec Grav, UA: >=1.03 — AB (ref 1.010–1.025)
UROBILINOGEN UA: 0.2 U/dL
pH, UA: 5.5 (ref 5.0–8.0)

## 2017-02-13 LAB — COMPREHENSIVE METABOLIC PANEL
ALBUMIN: 4.1 g/dL (ref 3.5–5.2)
ALT: 28 U/L (ref 0–35)
AST: 28 U/L (ref 0–37)
Alkaline Phosphatase: 50 U/L (ref 39–117)
BILIRUBIN TOTAL: 0.3 mg/dL (ref 0.2–1.2)
BUN: 14 mg/dL (ref 6–23)
CHLORIDE: 108 meq/L (ref 96–112)
CO2: 26 meq/L (ref 19–32)
Calcium: 9.2 mg/dL (ref 8.4–10.5)
Creatinine, Ser: 0.95 mg/dL (ref 0.40–1.20)
GFR: 66.48 mL/min (ref >60.00)
Glucose, Bld: 88 mg/dL (ref 70–99)
Potassium: 4 mEq/L (ref 3.5–5.1)
Sodium: 139 mEq/L (ref 135–145)
Total Protein: 7.2 g/dL (ref 6.0–8.3)

## 2017-02-13 LAB — TSH: TSH: 0.65 u[IU]/mL (ref 0.35–4.50)

## 2017-02-13 MED ORDER — VENLAFAXINE HCL ER 37.5 MG PO CP24: 37.5000 mg | ORAL_CAPSULE | Freq: Every day | ORAL | 0 refills | Status: DC

## 2017-02-13 MED ORDER — TRIAMCINOLONE ACETONIDE 0.1 % EX CREA: 1.0000 "application " | TOPICAL_CREAM | Freq: Two times a day (BID) | CUTANEOUS | 3 refills | Status: DC

## 2017-02-13 MED ORDER — TRAMADOL HCL 50 MG PO TABS: ORAL_TABLET | ORAL | 1 refills | Status: DC

## 2017-02-13 NOTE — Progress Notes (Signed)
Patient ID: Sheila [name], female   DOB: 1965/11/22, 51 y.o.   MRN: [id number]     Subjective:  I acted as a Education administrator for Dr. Carollee Herter.  Guerry Bruin, [place]   Patient ID: Sheila [name], female    DOB: 04-Jun-1966, 51 y.o.   MRN: [id number]  Chief Complaint  Patient presents with  . medication check    HPI  Patient is in today for follow up medication.  Would like a refill for triamcinolone for a recurrent rash on chest.  She is having increase pain because of weight gain.  She thinks it may be due to the Lexapro or ogestrel.  It hurts to exercise.  She is having bad anxiety, not in good mood, depression, and anger.  She would like to maybe revisit on getting back on Wellbutrin which seemed to work.  She is also having rectal pain.  When sitting it is very uncomfortable.  Patient Care Team: Carollee Herter, Alferd Apa, DO as PCP - General [facility] Blocker Sung Amabile, MD as Referring Physician (Internal Medicine) Domingo Pulse, MD (Urology) Milus Banister, MD as Attending Physician (Gastroenterology) Orie Rout, MD as Referring Physician (Specialist) Arvella Nigh, MD as Consulting Physician (Obstetrics and Gynecology)   Past Medical History:  Diagnosis Date  . Anal fissure   . Anxiety   . Chronic interstitial cystitis   . Crohn disease ([place])   . Fibromyalgia   . Migraines   . Pneumonia 16-Nov-2005  . SLE (systemic lupus erythematosus) ([place])     Past Surgical History:  Procedure Laterality Date  . BLADDER SURGERY    . CESAREAN SECTION    . KNEE ARTHROSCOPY    . OOPHORECTOMY     right  . TONSILLECTOMY    . WRIST SURGERY      Family History  Problem Relation Age of Onset  . Breast cancer Maternal Grandmother   . Breast cancer Maternal Aunt   . Breast cancer Mother   . Prostate cancer Maternal Grandfather   . Irritable bowel syndrome Sister   . Colon cancer Neg Hx   . Colon polyps Neg Hx   . Lung cancer Father        Died in November 17, 2011  . Esophageal cancer Neg Hx   . Kidney disease  Neg Hx   . Gallbladder disease Neg Hx     Social History   Social History  . Marital status: Married    Spouse name: N/A  . Number of children: 2  . Years of education: N/A   Occupational History  . Preschool teacher    Social History Main Topics  . Smoking status: Never Smoker  . Smokeless tobacco: Never Used  . Alcohol use No  . Drug use: No  . Sexual activity: Not on file   Other Topics Concern  . Not on file   Social History Narrative  . No narrative on file    Outpatient Medications Prior to Visit  Medication Sig Dispense Refill  . ALPRAZolam (XANAX) 0.5 MG tablet TAKE 1 TABLET BY MOUTH THREE TIMES DAILY AS NEEDED 90 tablet 0  . OGESTREL 0.5-50 MG-MCG tablet Take 1 tablet by mouth daily.    Marland Kitchen tiZANidine (ZANAFLEX) 4 MG tablet 1 po bid prn 60 tablet 1  . topiramate (TOPAMAX) 100 MG tablet TAKE 1 TABLET BY MOUTH EVERY DAY 30 tablet 0  . traZODone (DESYREL) 50 MG tablet 1-2 po qhs    . escitalopram (LEXAPRO) 10 MG tablet Take 1 tablet (10  mg total) by mouth daily. 30 tablet 2  . traMADol (ULTRAM) 50 MG tablet 1-2 po q6 hr prn pain    . DULoxetine (CYMBALTA) 30 MG capsule TAKE 1 CAPSULE BY MOUTH EVERY DAY FOR 1 WEEK, THEN 2 CAPSULES EVERY DAY 60 capsule 0  . DULoxetine (CYMBALTA) 60 MG capsule Take 1 capsule (60 mg total) by mouth 2 (two) times daily. 60 capsule 5   No facility-administered medications prior to visit.     Allergies  Allergen Reactions  . Ampicillin   . Cymbalta [Duloxetine Hcl] Other (See Comments)    Constipation, bloating, nausea  . Fentanyl Citrate     REACTION: vomiting, fever  . Penicillins     Review of Systems  Constitutional: Negative for chills, fever and malaise/fatigue.  HENT: Negative for congestion and hearing loss.   Eyes: Negative for discharge.  Respiratory: Negative for cough, sputum production and shortness of breath.   Cardiovascular: Negative for chest pain, palpitations and leg swelling.  Gastrointestinal: Negative for  abdominal pain, blood in stool, constipation, diarrhea, heartburn, nausea and vomiting.  Genitourinary: Negative for dysuria, frequency, hematuria and urgency.  Musculoskeletal: Positive for joint pain and myalgias. Negative for back pain and falls.  Skin: Negative for rash.  Neurological: Positive for headaches. Negative for dizziness, sensory change, loss of consciousness and weakness.  Endo/Heme/Allergies: Negative for environmental allergies. Does not bruise/bleed easily.  Psychiatric/Behavioral: Positive for depression. Negative for suicidal ideas. The patient is nervous/anxious. The patient does not have insomnia.        Objective:    Physical Exam  Constitutional: She is oriented to person, place, and time. She appears well-developed and well-nourished.  HENT:  Head: Normocephalic and atraumatic.  Eyes: Conjunctivae and EOM are normal.  Neck: Normal range of motion. Neck supple. No JVD present. Carotid bruit is not present. No thyromegaly present.  Cardiovascular: Normal rate, regular rhythm and normal heart sounds.   No murmur heard. Pulmonary/Chest: Effort normal and breath sounds normal. No respiratory distress. She has no wheezes. She has no rales. She exhibits no tenderness.  Musculoskeletal: She exhibits no edema.  Neurological: She is alert and oriented to person, place, and time.  Psychiatric: Her behavior is normal. Judgment normal. Her mood appears anxious. Thought content is not paranoid and not delusional. Cognition and memory are normal. She exhibits a depressed mood. She expresses no homicidal and no suicidal ideation. She expresses no suicidal plans and no homicidal plans.  Nursing note and vitals reviewed.   BP 98/62 (BP Location: Left Arm, Cuff Size: Normal)   Pulse 82   Temp 98.4 F (36.9 C) (Oral)   Resp 16   Ht 5' 4"  (1.626 m)   Wt 130 lb 12.8 oz (59.3 kg)   SpO2 98%   BMI 22.45 kg/m  Wt Readings from Last 3 Encounters:  02/13/17 130 lb 12.8 oz (59.3  kg)  08/27/16 123 lb 6.4 oz (56 kg)  07/29/16 123 lb 9.6 oz (56.1 kg)   BP Readings from Last 3 Encounters:  02/13/17 98/62  08/27/16 100/60  07/29/16 110/75     Immunization History  Administered Date(s) Administered  . Influenza Whole 08/04/2007, 08/29/2011, 07/29/2012  . Influenza,inj,Quad PF,36+ Mos 07/11/2015  . Td 06/21/2010    Health Maintenance  Topic Date Due  . HIV Screening  03/25/1983  . INFLUENZA VACCINE  08/27/2017 (Originally 04/16/2017)  . PAP SMEAR  11/30/2017  . TETANUS/TDAP  06/21/2020    Lab Results  Component Value Date   WBC  7.6 02/13/2017   HGB 14.4 02/13/2017   HCT 43.4 02/13/2017   PLT 293.0 02/13/2017   GLUCOSE 88 02/13/2017   ALT 28 02/13/2017   AST 28 02/13/2017   NA 139 02/13/2017   K 4.0 02/13/2017   CL 108 02/13/2017   CREATININE 0.95 02/13/2017   BUN 14 02/13/2017   CO2 26 02/13/2017   TSH 0.65 02/13/2017    Lab Results  Component Value Date   TSH 0.65 02/13/2017   Lab Results  Component Value Date   WBC 7.6 02/13/2017   HGB 14.4 02/13/2017   HCT 43.4 02/13/2017   MCV 95.7 02/13/2017   PLT 293.0 02/13/2017   Lab Results  Component Value Date   NA 139 02/13/2017   K 4.0 02/13/2017   CO2 26 02/13/2017   GLUCOSE 88 02/13/2017   BUN 14 02/13/2017   CREATININE 0.95 02/13/2017   BILITOT 0.3 02/13/2017   ALKPHOS 50 02/13/2017   AST 28 02/13/2017   ALT 28 02/13/2017   PROT 7.2 02/13/2017   ALBUMIN 4.1 02/13/2017   CALCIUM 9.2 02/13/2017   GFR 66.48 02/13/2017   No results found for: CHOL No results found for: HDL No results found for: LDLCALC No results found for: TRIG No results found for: CHOLHDL No results found for: HGBA1C       Assessment & Plan:   Problem List Items Addressed This Visit      Unprioritized   Rash and nonspecific skin eruption   Relevant Medications   triamcinolone cream (KENALOG) 0.1 %   Other Relevant Orders   POCT Urinalysis Dipstick (Automated) (Completed)   Fibromyalgia - Primary    Relevant Medications   traMADol (ULTRAM) 50 MG tablet   Other Relevant Orders   TSH (Completed)   CBC with Differential/Platelet (Completed)   Comprehensive metabolic panel (Completed)   Vitamin D 1,25 dihydroxy   POCT Urinalysis Dipstick (Automated) (Completed)    Other Visit Diagnoses    Severe episode of recurrent major depressive disorder, without psychotic features ([place])       Relevant Orders   Ambulatory referral to Psychology   POCT Urinalysis Dipstick (Automated) (Completed)   Generalized anxiety disorder       Relevant Orders   Ambulatory referral to Psychology   POCT Urinalysis Dipstick (Automated) (Completed)   Rectal pain       Relevant Orders   Ambulatory referral to Gastroenterology   TSH (Completed)   CBC with Differential/Platelet (Completed)   Comprehensive metabolic panel (Completed)   POCT Urinalysis Dipstick (Automated) (Completed)   Leukocytes in urine       Relevant Orders   Urine culture      I have discontinued Ms. Locey's DULoxetine, DULoxetine, and escitalopram. I am also having her maintain her OGESTREL, traZODone, ALPRAZolam, tiZANidine, topiramate, triamcinolone cream, and traMADol.  Meds ordered this encounter  Medications  . DISCONTD: triamcinolone cream (KENALOG) 0.1 %    Sig: Apply 1 application topically 2 (two) times daily.  Marland Kitchen triamcinolone cream (KENALOG) 0.1 %    Sig: Apply 1 application topically 2 (two) times daily.    Dispense:  45 g    Refill:  3  . traMADol (ULTRAM) 50 MG tablet    Sig: 1-2 po q6 hr prn pain    Dispense:  30 tablet    Refill:  1  . DISCONTD: venlafaxine XR (EFFEXOR XR) 37.5 MG 24 hr capsule    Sig: Take 1 capsule (37.5 mg total) by mouth daily with breakfast.  Dispense:  60 capsule    Refill:  0    CMA served as scribe during this visit. History, Physical and Plan performed by medical provider. Documentation and orders reviewed and attested to.  Ann Held, DO

## 2017-02-13 NOTE — Patient Instructions (Signed)

## 2017-02-14 DIAGNOSIS — H43393 Other vitreous opacities, bilateral: Secondary | ICD-10-CM | POA: Diagnosis not present

## 2017-02-15 LAB — VITAMIN D 1,25 DIHYDROXY
VITAMIN D 1, 25 (OH) TOTAL: 78 pg/mL — AB (ref 18–72)
Vitamin D2 1, 25 (OH)2: <8 pg/mL
Vitamin D3 1, 25 (OH)2: 78 pg/mL

## 2017-02-15 LAB — URINE CULTURE

## 2017-02-18 ENCOUNTER — Telehealth: Payer: Self-pay | Admitting: Family Medicine

## 2017-02-18 ENCOUNTER — Other Ambulatory Visit: Payer: Self-pay | Admitting: Family Medicine

## 2017-02-18 ENCOUNTER — Ambulatory Visit: Payer: Federal, State, Local not specified - PPO | Admitting: Nurse Practitioner

## 2017-02-18 DIAGNOSIS — R300 Dysuria: Secondary | ICD-10-CM

## 2017-02-18 MED ORDER — SULFAMETHOXAZOLE-TRIMETHOPRIM 800-160 MG PO TABS: 1.0000 | ORAL_TABLET | Freq: Two times a day (BID) | ORAL | 0 refills | Status: DC

## 2017-02-18 NOTE — Addendum Note (Signed)
Addended by: Sharon Seller B on: 02/18/2017 06:15 PM   Modules accepted: Orders

## 2017-02-18 NOTE — Telephone Encounter (Signed)
Pt has UTI symptoms started last night with pain frequency and burning no fever. Pt uses [facility] on [address] in [place].

## 2017-02-18 NOTE — Telephone Encounter (Signed)
Pt has UTI symptoms since last night. Please call pt. Can dr Etter Sjogren chase call in meds for pt?

## 2017-02-18 NOTE — Telephone Encounter (Signed)
Called the patient informed antibiotic sent in Put in order for 2 weeks--she will call back to schedule appt.

## 2017-02-18 NOTE — Telephone Encounter (Signed)
Bactrim ds 1 po bid x 7 days --- urine and culture in 2 weeks

## 2017-02-27 DIAGNOSIS — N941 Unspecified dyspareunia: Secondary | ICD-10-CM | POA: Diagnosis not present

## 2017-02-27 DIAGNOSIS — M6281 Muscle weakness (generalized): Secondary | ICD-10-CM | POA: Diagnosis not present

## 2017-02-27 DIAGNOSIS — R278 Other lack of coordination: Secondary | ICD-10-CM | POA: Diagnosis not present

## 2017-02-27 DIAGNOSIS — M62838 Other muscle spasm: Secondary | ICD-10-CM | POA: Diagnosis not present

## 2017-03-04 DIAGNOSIS — R351 Nocturia: Secondary | ICD-10-CM | POA: Diagnosis not present

## 2017-03-04 DIAGNOSIS — M545 Low back pain: Secondary | ICD-10-CM | POA: Diagnosis not present

## 2017-03-04 DIAGNOSIS — R102 Pelvic and perineal pain: Secondary | ICD-10-CM | POA: Diagnosis not present

## 2017-03-04 DIAGNOSIS — R3915 Urgency of urination: Secondary | ICD-10-CM | POA: Diagnosis not present

## 2017-03-05 ENCOUNTER — Encounter: Payer: Self-pay | Admitting: Gastroenterology

## 2017-03-05 ENCOUNTER — Other Ambulatory Visit: Payer: Self-pay | Admitting: Family Medicine

## 2017-03-05 ENCOUNTER — Other Ambulatory Visit (INDEPENDENT_AMBULATORY_CARE_PROVIDER_SITE_OTHER): Payer: Federal, State, Local not specified - PPO

## 2017-03-05 ENCOUNTER — Ambulatory Visit (INDEPENDENT_AMBULATORY_CARE_PROVIDER_SITE_OTHER): Payer: Federal, State, Local not specified - PPO | Admitting: Gastroenterology

## 2017-03-05 VITALS — BP 116/68 | HR 72 | Ht 64.0 in | Wt 131.6 lb

## 2017-03-05 DIAGNOSIS — K6289 Other specified diseases of anus and rectum: Secondary | ICD-10-CM | POA: Diagnosis not present

## 2017-03-05 DIAGNOSIS — F418 Other specified anxiety disorders: Secondary | ICD-10-CM

## 2017-03-05 DIAGNOSIS — R140 Abdominal distension (gaseous): Secondary | ICD-10-CM | POA: Insufficient documentation

## 2017-03-05 DIAGNOSIS — R300 Dysuria: Secondary | ICD-10-CM

## 2017-03-05 DIAGNOSIS — Z8719 Personal history of other diseases of the digestive system: Secondary | ICD-10-CM | POA: Diagnosis not present

## 2017-03-05 LAB — URINALYSIS, ROUTINE W REFLEX MICROSCOPIC
Bilirubin Urine: NEGATIVE
NITRITE: NEGATIVE
PH: 5.5 (ref 5.0–8.0)
TOTAL PROTEIN, URINE-UPE24: NEGATIVE
Urine Glucose: NEGATIVE
Urobilinogen, UA: 0.2 (ref 0.0–1.0)

## 2017-03-05 NOTE — Progress Notes (Signed)
I agree with the above note, plan 

## 2017-03-05 NOTE — Progress Notes (Addendum)
03/05/2017 Sheila [name] [id number] September 09, 1966 Review of pertinent gastrointestinal problems: 1. Crohn's disease Copy of note from Dr. Sharlett Iles dated 02/2010: "Darshana is a long-term patient of mine who has Crohn's ileocolitis previously documented by colonoscopy and mucosal biopsies. She also has chronic anxiety, an element of IBS, and chronic interstitial cystitis managed by Dr. Alona Bene."  At that time he thought she may be having a flare, started her on lialda 2 pills daily; labs showed normal CBC, normal CRP, normal CMET, normal stool testing.  Colonoscopy Dr. Ardis Hughs 2010 (for DRP at [facility]) showed "normal TI, normal colon" Biopsies from TI were normal, random colon biopsies suggested chronic inflammation. Colonoscopy Dr. Sharlett Iles 2005 showed "erosions and granularity of TI and colon", pathology of both showed "focally active inflammation."   HISTORY OF PRESENT ILLNESS:  Patient last seen by Dr. Ardis Hughs in 11/2014.  Questionable diagnosis of Crohn's disease.  Not on any medication for this.  Has anxiety, fibromyalgia, chronic headaches, IC.  Is here today with complaints of rectal pain and bloating.  Rectal pain has been present for close to 2 months.  Pain is constant, hurts to sit, but extremely painful to have a BM.  No real changes in her bowel habits.  Has a BM every day.  Bloating comes and goes, but abdomen always uncomfortable and sore.  Sometimes looks like she is pregnant when bloated.  Has a history of fissure.  Reports seeing blood in her stool on and off, sometimes bright red and sometimes small clots.  She is a very difficult historian with pressured/rambling speech.   Past Medical History:  Diagnosis Date  . Anal fissure   . Anxiety   . Chronic interstitial cystitis   . Crohn disease ([place])   . Fibromyalgia   . Migraines   . Pneumonia 2007  . SLE (systemic lupus erythematosus) ([place])    Past Surgical History:  Procedure Laterality Date  . BLADDER SURGERY      . CESAREAN SECTION    . KNEE ARTHROSCOPY    . OOPHORECTOMY     right  . TONSILLECTOMY    . WRIST SURGERY      reports that she has never smoked. She has never used smokeless tobacco. She reports that she does not drink alcohol or use drugs. family history includes Breast cancer in her maternal aunt, maternal grandmother, and mother; Irritable bowel syndrome in her sister; Lung cancer in her father; Prostate cancer in her maternal grandfather. Allergies  Allergen Reactions  . Ampicillin   . Cymbalta [Duloxetine Hcl] Other (See Comments)    Constipation, bloating, nausea  . Fentanyl Citrate     REACTION: vomiting, fever  . Penicillins       Outpatient Encounter Prescriptions as of 03/05/2017  Medication Sig  . ALPRAZolam (XANAX) 0.5 MG tablet TAKE 1 TABLET BY MOUTH THREE TIMES DAILY AS NEEDED  . OGESTREL 0.5-50 MG-MCG tablet Take 1 tablet by mouth daily.  Marland Kitchen sulfamethoxazole-trimethoprim (BACTRIM DS,SEPTRA DS) 800-160 MG tablet Take 1 tablet by mouth 2 (two) times daily. Take for days  . tiZANidine (ZANAFLEX) 4 MG tablet 1 po bid prn  . topiramate (TOPAMAX) 100 MG tablet TAKE 1 TABLET BY MOUTH EVERY DAY  . traMADol (ULTRAM) 50 MG tablet 1-2 po q6 hr prn pain  . traZODone (DESYREL) 50 MG tablet 1-2 po qhs  . triamcinolone cream (KENALOG) 0.1 % Apply 1 application topically 2 (two) times daily.  Marland Kitchen venlafaxine XR (EFFEXOR-XR) 37.5 MG 24 hr  capsule TAKE 1 CAPSULE(37.5 MG) BY MOUTH DAILY WITH BREAKFAST   No facility-administered encounter medications on file as of 03/05/2017.      REVIEW OF SYSTEMS  : All other systems reviewed and negative except where noted in the History of Present Illness.   PHYSICAL EXAM: BP 116/68   Pulse 72   Ht 5' 4"  (1.626 m)   Wt 131 lb 9.6 oz (59.7 kg)   BMI 22.59 kg/m  General: Well developed white female in no acute distress; extremely anxious and with pressured/rambling speech Head: Normocephalic and atraumatic Eyes:  Sclerae anicteric,  conjunctiva pink. Ears: Normal auditory acuity Lungs: Clear throughout to auscultation; no increased WOB. Heart: Regular rate and rhythm Abdomen: Soft, non-distended.  BS present.  Mild diffuse TTP. Rectal:  No external abnormalities noted except for questionable area posteriorly that may be a fistula opening.  DRE extremely painful posteriorly but very high in rectum rather than upon immediate insertion of finger.   Musculoskeletal: Symmetrical with no gross deformities  Skin: No lesions on visible extremities Extremities: No edema  Neurological: Alert oriented x 4, grossly non-focal Psychological:  Alert and cooperative. Normal mood and affect  ASSESSMENT AND PLAN: *51 year old female with high anxiety, fibromyalgia, chronic migraines, IC, and possible Crohn's disease who presents with bloating and rectal pain.  Not on any medication for Crohn's disease at this time. -Rectal pain:  No fissure seen from external exam and tenderness was much higher in rectum.  With questionable history of Crohn's disease I think that we need to rule out a perirectal abscess first.  Will order pelvic MRI. -Bloating:  Could be IBS or be related to Crohn's disease if she does in fact have this condition with any complication.  Will try IBgard for now.  CC:  Ann Held, *

## 2017-03-06 LAB — URINE CULTURE: Organism ID, Bacteria: NO GROWTH

## 2017-03-06 NOTE — Telephone Encounter (Signed)
Faxed hardcopy to alprazolam to [facility]

## 2017-03-06 NOTE — Telephone Encounter (Signed)
Requesting:   alprazolam Contract   none UDS   none Last OV    02/13/2017 Last Refill    #90 no refills on 07/29/2016  Please Advise

## 2017-03-07 ENCOUNTER — Ambulatory Visit (HOSPITAL_COMMUNITY)
Admission: RE | Admit: 2017-03-07 | Discharge: 2017-03-07 | Disposition: A | Discharge status: Home / Self Care | Payer: Federal, State, Local not specified - PPO | Source: Ambulatory Visit | Attending: Gastroenterology | Admitting: Gastroenterology

## 2017-03-07 ENCOUNTER — Other Ambulatory Visit: Payer: Self-pay | Admitting: Family Medicine

## 2017-03-07 DIAGNOSIS — N800 Endometriosis of uterus: Secondary | ICD-10-CM | POA: Diagnosis not present

## 2017-03-07 DIAGNOSIS — D259 Leiomyoma of uterus, unspecified: Secondary | ICD-10-CM | POA: Diagnosis not present

## 2017-03-07 DIAGNOSIS — Z8719 Personal history of other diseases of the digestive system: Secondary | ICD-10-CM | POA: Insufficient documentation

## 2017-03-07 DIAGNOSIS — K6289 Other specified diseases of anus and rectum: Secondary | ICD-10-CM | POA: Insufficient documentation

## 2017-03-07 DIAGNOSIS — G43009 Migraine without aura, not intractable, without status migrainosus: Secondary | ICD-10-CM

## 2017-03-07 MED ORDER — GADOBENATE DIMEGLUMINE 529 MG/ML IV SOLN
15.0000 mL | Freq: Once | INTRAVENOUS | Status: AC | PRN
  Administered 2017-03-07: 13 mL via INTRAVENOUS

## 2017-03-10 ENCOUNTER — Telehealth: Payer: Self-pay | Admitting: Gastroenterology

## 2017-03-10 DIAGNOSIS — R102 Pelvic and perineal pain: Secondary | ICD-10-CM | POA: Diagnosis not present

## 2017-03-10 DIAGNOSIS — M6281 Muscle weakness (generalized): Secondary | ICD-10-CM | POA: Diagnosis not present

## 2017-03-10 DIAGNOSIS — M62838 Other muscle spasm: Secondary | ICD-10-CM | POA: Diagnosis not present

## 2017-03-10 DIAGNOSIS — R3915 Urgency of urination: Secondary | ICD-10-CM | POA: Diagnosis not present

## 2017-03-10 NOTE — Telephone Encounter (Signed)
Sheila [name], the pt is calling requesting results from MRI. Please advise

## 2017-03-12 ENCOUNTER — Other Ambulatory Visit: Payer: Self-pay

## 2017-03-12 MED ORDER — DILTIAZEM GEL 2 %: 1.0000 "application " | Freq: Three times a day (TID) | CUTANEOUS | 1 refills | Status: DC

## 2017-03-12 NOTE — Telephone Encounter (Signed)
Prescription has been resent to [facility], pt advised

## 2017-03-18 DIAGNOSIS — R1084 Generalized abdominal pain: Secondary | ICD-10-CM | POA: Diagnosis not present

## 2017-03-20 DIAGNOSIS — R3915 Urgency of urination: Secondary | ICD-10-CM | POA: Diagnosis not present

## 2017-03-20 DIAGNOSIS — M6281 Muscle weakness (generalized): Secondary | ICD-10-CM | POA: Diagnosis not present

## 2017-03-20 DIAGNOSIS — R102 Pelvic and perineal pain: Secondary | ICD-10-CM | POA: Diagnosis not present

## 2017-03-20 DIAGNOSIS — N941 Unspecified dyspareunia: Secondary | ICD-10-CM | POA: Diagnosis not present

## 2017-03-21 ENCOUNTER — Ambulatory Visit (INDEPENDENT_AMBULATORY_CARE_PROVIDER_SITE_OTHER): Payer: Federal, State, Local not specified - PPO | Admitting: Psychology

## 2017-03-21 DIAGNOSIS — F411 Generalized anxiety disorder: Secondary | ICD-10-CM | POA: Diagnosis not present

## 2017-03-28 DIAGNOSIS — R3915 Urgency of urination: Secondary | ICD-10-CM | POA: Diagnosis not present

## 2017-03-28 DIAGNOSIS — M6281 Muscle weakness (generalized): Secondary | ICD-10-CM | POA: Diagnosis not present

## 2017-03-28 DIAGNOSIS — M797 Fibromyalgia: Secondary | ICD-10-CM | POA: Diagnosis not present

## 2017-03-28 DIAGNOSIS — M62838 Other muscle spasm: Secondary | ICD-10-CM | POA: Diagnosis not present

## 2017-04-03 DIAGNOSIS — K5900 Constipation, unspecified: Secondary | ICD-10-CM | POA: Diagnosis not present

## 2017-04-03 DIAGNOSIS — M6281 Muscle weakness (generalized): Secondary | ICD-10-CM | POA: Diagnosis not present

## 2017-04-03 DIAGNOSIS — M62838 Other muscle spasm: Secondary | ICD-10-CM | POA: Diagnosis not present

## 2017-04-03 DIAGNOSIS — M797 Fibromyalgia: Secondary | ICD-10-CM | POA: Diagnosis not present

## 2017-04-07 DIAGNOSIS — M62838 Other muscle spasm: Secondary | ICD-10-CM | POA: Diagnosis not present

## 2017-04-07 DIAGNOSIS — R3915 Urgency of urination: Secondary | ICD-10-CM | POA: Diagnosis not present

## 2017-04-07 DIAGNOSIS — R102 Pelvic and perineal pain: Secondary | ICD-10-CM | POA: Diagnosis not present

## 2017-04-07 DIAGNOSIS — M6281 Muscle weakness (generalized): Secondary | ICD-10-CM | POA: Diagnosis not present

## 2017-04-08 ENCOUNTER — Ambulatory Visit (INDEPENDENT_AMBULATORY_CARE_PROVIDER_SITE_OTHER): Payer: Federal, State, Local not specified - PPO | Admitting: Psychology

## 2017-04-08 DIAGNOSIS — F4323 Adjustment disorder with mixed anxiety and depressed mood: Secondary | ICD-10-CM

## 2017-04-10 DIAGNOSIS — M6281 Muscle weakness (generalized): Secondary | ICD-10-CM | POA: Diagnosis not present

## 2017-04-10 DIAGNOSIS — K5900 Constipation, unspecified: Secondary | ICD-10-CM | POA: Diagnosis not present

## 2017-04-10 DIAGNOSIS — R102 Pelvic and perineal pain: Secondary | ICD-10-CM | POA: Diagnosis not present

## 2017-04-10 DIAGNOSIS — M62838 Other muscle spasm: Secondary | ICD-10-CM | POA: Diagnosis not present

## 2017-04-15 ENCOUNTER — Encounter: Payer: Self-pay | Admitting: Family Medicine

## 2017-04-15 ENCOUNTER — Other Ambulatory Visit: Payer: Self-pay | Admitting: Family Medicine

## 2017-04-15 ENCOUNTER — Ambulatory Visit (INDEPENDENT_AMBULATORY_CARE_PROVIDER_SITE_OTHER): Payer: Federal, State, Local not specified - PPO | Admitting: Family Medicine

## 2017-04-15 VITALS — BP 114/62 | HR 91 | Temp 98.2°F | Ht 64.0 in | Wt 133.1 lb

## 2017-04-15 DIAGNOSIS — G43009 Migraine without aura, not intractable, without status migrainosus: Secondary | ICD-10-CM

## 2017-04-15 DIAGNOSIS — Z8719 Personal history of other diseases of the digestive system: Secondary | ICD-10-CM | POA: Diagnosis not present

## 2017-04-15 DIAGNOSIS — G4700 Insomnia, unspecified: Secondary | ICD-10-CM

## 2017-04-15 DIAGNOSIS — M797 Fibromyalgia: Secondary | ICD-10-CM | POA: Diagnosis not present

## 2017-04-15 DIAGNOSIS — F418 Other specified anxiety disorders: Secondary | ICD-10-CM | POA: Diagnosis not present

## 2017-04-15 DIAGNOSIS — K50119 Crohn's disease of large intestine with unspecified complications: Secondary | ICD-10-CM

## 2017-04-15 MED ORDER — BUPROPION HCL ER (XL) 300 MG PO TB24: 300.0000 mg | ORAL_TABLET | Freq: Every day | ORAL | 3 refills | Status: DC

## 2017-04-15 MED ORDER — TIZANIDINE HCL 4 MG PO TABS: ORAL_TABLET | ORAL | 1 refills | Status: DC

## 2017-04-15 MED ORDER — BUPROPION HCL ER (XL) 150 MG PO TB24: 150.0000 mg | ORAL_TABLET | Freq: Every day | ORAL | 0 refills | Status: DC

## 2017-04-15 MED ORDER — ALPRAZOLAM 0.5 MG PO TABS: 0.5000 mg | ORAL_TABLET | Freq: Three times a day (TID) | ORAL | 0 refills | Status: DC | PRN

## 2017-04-15 MED ORDER — BUPROPION HCL ER (XL) 150 MG PO TB24: ORAL_TABLET | ORAL | 0 refills | Status: DC

## 2017-04-15 MED ORDER — TRAZODONE HCL 50 MG PO TABS: ORAL_TABLET | ORAL | 1 refills | Status: DC

## 2017-04-15 MED ORDER — TRAMADOL HCL 50 MG PO TABS: ORAL_TABLET | ORAL | 1 refills | Status: DC

## 2017-04-15 MED ORDER — TOPIRAMATE 100 MG PO TABS: 100.0000 mg | ORAL_TABLET | Freq: Every day | ORAL | 0 refills | Status: DC

## 2017-04-15 NOTE — Patient Instructions (Signed)

## 2017-04-15 NOTE — Progress Notes (Signed)
Patient ID: Sheila [name], female    DOB: August 13, 1966  Age: 51 y.o. MRN: [id number]    Subjective:  Subjective  HPI [name] presents for f/u anxiety/ depression.  She would like to go back on wellbutrin.  She knows it normally causes anxiety but it has worked in the past for her.  She is seeing a counselor   Review of Systems  Constitutional: Negative for chills and fever.  HENT: Negative for congestion and hearing loss.   Eyes: Negative for discharge.  Respiratory: Negative for cough and shortness of breath.   Cardiovascular: Negative for chest pain, palpitations and leg swelling.  Gastrointestinal: Negative for abdominal pain, blood in stool, constipation, diarrhea, nausea and vomiting.  Genitourinary: Negative for dysuria, frequency, hematuria and urgency.  Musculoskeletal: Negative for back pain and myalgias.  Skin: Negative for rash.  Allergic/Immunologic: Negative for environmental allergies.  Neurological: Negative for dizziness, weakness and headaches.  Hematological: Does not bruise/bleed easily.  Psychiatric/Behavioral: Positive for decreased concentration and sleep disturbance. Negative for suicidal ideas. The patient is nervous/anxious.     History Past Medical History:  Diagnosis Date  . Anal fissure   . Anxiety   . Chronic interstitial cystitis   . Crohn disease ([place])   . Fibromyalgia   . Migraines   . Pneumonia 2007  . SLE (systemic lupus erythematosus) ([place])     She has a past surgical history that includes Cesarean section; Knee arthroscopy; Wrist surgery; Oophorectomy; Bladder surgery; and Tonsillectomy.   Her family history includes Breast cancer in her maternal aunt, maternal grandmother, and mother; Irritable bowel syndrome in her sister; Lung cancer in her father; Prostate cancer in her maternal grandfather.She reports that she has never smoked. She has never used smokeless tobacco. She reports that she does not drink alcohol or use  drugs.  Current Outpatient Prescriptions on File Prior to Visit  Medication Sig Dispense Refill  . OGESTREL 0.5-50 MG-MCG tablet Take 1 tablet by mouth daily.    Marland Kitchen triamcinolone cream (KENALOG) 0.1 % Apply 1 application topically 2 (two) times daily. 45 g 3   No current facility-administered medications on file prior to visit.      Objective:  Objective  Physical Exam  Constitutional: She is oriented to person, place, and time. She appears well-developed and well-nourished.  HENT:  Head: Normocephalic and atraumatic.  Eyes: Conjunctivae and EOM are normal.  Neck: Normal range of motion. Neck supple. No JVD present. Carotid bruit is not present. No thyromegaly present.  Cardiovascular: Normal rate, regular rhythm and normal heart sounds.   No murmur heard. Pulmonary/Chest: Effort normal and breath sounds normal. No respiratory distress. She has no wheezes. She has no rales. She exhibits no tenderness.  Musculoskeletal: She exhibits no edema.  Neurological: She is alert and oriented to person, place, and time.  Psychiatric: Her speech is normal. Judgment and thought content normal. Her mood appears anxious. Her affect is not angry, not blunt, not labile and not inappropriate. Cognition and memory are normal. She exhibits a depressed mood. She is inattentive.  Nursing note and vitals reviewed.  BP 114/62 (BP Location: Left Arm, Patient Position: Sitting, Cuff Size: Normal)   Pulse 91   Temp 98.2 F (36.8 C) (Oral)   Ht 5' 4"  (1.626 m)   Wt 133 lb 2 oz (60.4 kg)   SpO2 97%   BMI 22.85 kg/m  Wt Readings from Last 3 Encounters:  04/15/17 133 lb 2 oz (60.4 kg)  03/05/17 131 lb  9.6 oz (59.7 kg)  02/13/17 130 lb 12.8 oz (59.3 kg)     Lab Results  Component Value Date   WBC 7.6 02/13/2017   HGB 14.4 02/13/2017   HCT 43.4 02/13/2017   PLT 293.0 02/13/2017   GLUCOSE 88 02/13/2017   ALT 28 02/13/2017   AST 28 02/13/2017   NA 139 02/13/2017   K 4.0 02/13/2017   CL 108  02/13/2017   CREATININE 0.95 02/13/2017   BUN 14 02/13/2017   CO2 26 02/13/2017   TSH 0.65 02/13/2017    Mr Pelvis W Wo Contrast  Result Date: 03/08/2017 CLINICAL DATA:  Rectal pain.  Reported history of Crohn disease. EXAM: MRI PELVIS WITHOUT AND WITH CONTRAST TECHNIQUE: Multiplanar multisequence MR imaging of the pelvis was performed both before and after administration of intravenous contrast. CONTRAST:  47m MULTIHANCE GADOBENATE DIMEGLUMINE 529 MG/ML IV SOLN COMPARISON:  None. FINDINGS: Urinary Tract: Collapsed and grossly normal bladder. Normal urethra. Bowel: No evidence of perianal or perirectal fistula or abscess. No evidence of wall thickening, wall enhancement or dilatation in the visualized pelvic bowel loops. Vascular/Lymphatic: No pathologically enlarged pelvic lymph nodes. No significant vascular abnormality seen. Reproductive: Retroverted retroflexed enlarged globular uterus measures 10.5 x 5.7 x 6.2 cm. Cesarean scar is seen in the anterior lower uterine segment. Multiple small uterine fibroids, with representative fibroids as follows: - right posterior uterine body intramural 1.7 x 1.6 x 1.3 cm fibroid - left posterior fundal intramural 1.2 x 1.1 x 1.1 cm fibroid - exophytic right anterior upper uterine 1.5 x 1.3 x 1.3 cm fibroid (versus right adnexal broad ligament fibroid) No submucosal or intracavitary fibroids. Bilayer endometrial thickness 5 mm. No endometrial cavity fluid or focal endometrial mass. Prominently thickened junctional zone (26 mm thickness), compatible with diffuse adenomyosis of the uterus, asymmetrically prominent in the posterior uterus. No gross abnormality in the cervix. Right ovary measures 2.1 x 2.0 x 1.0 cm. Left ovary measures 3.0 x 2.1 x 2.2 cm and contains a dominant 1.8 cm follicular cyst. No suspicious ovarian or adnexal masses. Other:  No free fluid in the pelvis.  No focal fluid collections. Musculoskeletal: No suspicious bone lesions identified.  IMPRESSION: 1. No evidence of perianal or perirectal fistula or abscess. No pelvic bowel abnormality detected. 2. Enlarged globular retroverted uterus with diffuse uterine adenomyosis and multiple small fibroids, including a small exophytic upper right uterine fibroid versus right broad ligament fibroid. 3. No suspicious ovarian or adnexal masses. Electronically Signed   By: JIlona SorrelM.D.   On: 03/08/2017 09:53     Assessment & Plan:  Plan  I have discontinued Ms. Koroma's venlafaxine XR, sulfamethoxazole-trimethoprim, and diltiazem. I have also changed her ALPRAZolam, topiramate, and buPROPion. Additionally, I am having her maintain her OGESTREL, triamcinolone cream, traMADol, traZODone, tiZANidine, and buPROPion.  Meds ordered this encounter  Medications  . ALPRAZolam (XANAX) 0.5 MG tablet    Sig: Take 1 tablet (0.5 mg total) by mouth 3 (three) times daily as needed.    Dispense:  90 tablet    Refill:  0  . traMADol (ULTRAM) 50 MG tablet    Sig: 1-2 po q6 hr prn pain    Dispense:  30 tablet    Refill:  1  . traZODone (DESYREL) 50 MG tablet    Sig: 1-2 po qhs    Dispense:  60 tablet    Refill:  1  . tiZANidine (ZANAFLEX) 4 MG tablet    Sig: 1 po bid prn  Dispense:  60 tablet    Refill:  1  . topiramate (TOPAMAX) 100 MG tablet    Sig: Take 1 tablet (100 mg total) by mouth daily.    Dispense:  30 tablet    Refill:  0  . DISCONTD: buPROPion (WELLBUTRIN XL) 150 MG 24 hr tablet    Sig: Take 1 tablet (150 mg total) by mouth daily.    Dispense:  60 tablet    Refill:  0  . DISCONTD: buPROPion (WELLBUTRIN XL) 300 MG 24 hr tablet    Sig: Take 1 tablet (300 mg total) by mouth daily.    Dispense:  90 tablet    Refill:  3  . buPROPion (WELLBUTRIN XL) 150 MG 24 hr tablet    Sig: 1 po qd x 1 week then inc to 2 po qd    Dispense:  60 tablet    Refill:  0  . buPROPion (WELLBUTRIN XL) 300 MG 24 hr tablet    Sig: Take 1 tablet (300 mg total) by mouth daily.    Dispense:  90 tablet     Refill:  3    Problem List Items Addressed This Visit      Unprioritized   Fibromyalgia   Relevant Medications   traMADol (ULTRAM) 50 MG tablet   Migraine without aura and without status migrainosus, not intractable   Relevant Medications   traMADol (ULTRAM) 50 MG tablet   traZODone (DESYREL) 50 MG tablet   tiZANidine (ZANAFLEX) 4 MG tablet   topiramate (TOPAMAX) 100 MG tablet   buPROPion (WELLBUTRIN XL) 150 MG 24 hr tablet   buPROPion (WELLBUTRIN XL) 300 MG 24 hr tablet   Depression with anxiety    Pt is off the effexor  Start wellbutrin xl 150 qam x 1 week then inc to 2 po qam rx for 300 mg printed and given to the pt for the next month F/u 4-6 weeks  con't counselor Consider psych if symptoms do not improve      Relevant Medications   ALPRAZolam (XANAX) 0.5 MG tablet   buPROPion (WELLBUTRIN XL) 150 MG 24 hr tablet   buPROPion (WELLBUTRIN XL) 300 MG 24 hr tablet   H/O Crohn's disease    Pt is requesting a new GI        Other Visit Diagnoses    Insomnia, unspecified type    -  Primary   Relevant Medications   traZODone (DESYREL) 50 MG tablet   Crohn's disease of colon with complication [facility])       Relevant Orders   Ambulatory referral to Gastroenterology      Follow-up: Return in about 4 weeks (around 05/13/2017), or depression/ anxiety.  Ann Held, DO

## 2017-04-15 NOTE — Assessment & Plan Note (Signed)
Pt is off the effexor  Start wellbutrin xl 150 qam x 1 week then inc to 2 po qam rx for 300 mg printed and given to the pt for the next month F/u 4-6 weeks  con't counselor Consider psych if symptoms do not improve

## 2017-04-15 NOTE — Assessment & Plan Note (Signed)
Pt is requesting a new GI

## 2017-04-22 ENCOUNTER — Ambulatory Visit (INDEPENDENT_AMBULATORY_CARE_PROVIDER_SITE_OTHER): Payer: Federal, State, Local not specified - PPO | Admitting: Psychology

## 2017-04-22 DIAGNOSIS — F4323 Adjustment disorder with mixed anxiety and depressed mood: Secondary | ICD-10-CM | POA: Diagnosis not present

## 2017-05-06 ENCOUNTER — Ambulatory Visit (INDEPENDENT_AMBULATORY_CARE_PROVIDER_SITE_OTHER): Payer: Federal, State, Local not specified - PPO | Admitting: Psychology

## 2017-05-06 DIAGNOSIS — F411 Generalized anxiety disorder: Secondary | ICD-10-CM

## 2017-05-20 ENCOUNTER — Ambulatory Visit (INDEPENDENT_AMBULATORY_CARE_PROVIDER_SITE_OTHER): Payer: Federal, State, Local not specified - PPO | Admitting: Psychology

## 2017-05-20 DIAGNOSIS — F411 Generalized anxiety disorder: Secondary | ICD-10-CM

## 2017-05-30 ENCOUNTER — Other Ambulatory Visit: Payer: Self-pay | Admitting: Family Medicine

## 2017-05-30 DIAGNOSIS — F418 Other specified anxiety disorders: Secondary | ICD-10-CM

## 2017-06-02 NOTE — Telephone Encounter (Signed)
Placed on ledge.

## 2017-06-02 NOTE — Telephone Encounter (Signed)
Requesting: Alprazolam Contract: 6.11.14 UDS: 1.03.2014 Last OV: 7.31.18 Next OV: Not scheduled but was advised to F/U in 4 wks Last Refill: 7.31.18   Please advise

## 2017-06-02 NOTE — Telephone Encounter (Signed)
Need database printed out

## 2017-06-03 ENCOUNTER — Ambulatory Visit: Payer: Federal, State, Local not specified - PPO | Admitting: Psychology

## 2017-06-03 ENCOUNTER — Other Ambulatory Visit: Payer: Self-pay | Admitting: Family Medicine

## 2017-06-03 DIAGNOSIS — F418 Other specified anxiety disorders: Secondary | ICD-10-CM

## 2017-06-03 NOTE — Telephone Encounter (Signed)
Rx was signed and faxed today

## 2017-06-03 NOTE — Telephone Encounter (Signed)
This was faxed in on 9.17.18/thx dmf

## 2017-06-04 DIAGNOSIS — D225 Melanocytic nevi of trunk: Secondary | ICD-10-CM | POA: Diagnosis not present

## 2017-06-04 DIAGNOSIS — C4441 Basal cell carcinoma of skin of scalp and neck: Secondary | ICD-10-CM | POA: Diagnosis not present

## 2017-06-04 DIAGNOSIS — Z1283 Encounter for screening for malignant neoplasm of skin: Secondary | ICD-10-CM | POA: Diagnosis not present

## 2017-06-04 DIAGNOSIS — L820 Inflamed seborrheic keratosis: Secondary | ICD-10-CM | POA: Diagnosis not present

## 2017-06-04 DIAGNOSIS — L821 Other seborrheic keratosis: Secondary | ICD-10-CM | POA: Diagnosis not present

## 2017-06-05 NOTE — Telephone Encounter (Signed)
Received successful conformation

## 2017-06-26 ENCOUNTER — Other Ambulatory Visit: Payer: Self-pay | Admitting: Family Medicine

## 2017-06-26 DIAGNOSIS — M797 Fibromyalgia: Secondary | ICD-10-CM

## 2017-06-27 NOTE — Telephone Encounter (Signed)
Rx faxed to [facility]

## 2017-06-27 NOTE — Telephone Encounter (Signed)
Requesting: Tramadol Contract: Yes UDS: 1.03.2014 Low-risk next screen 7.3.2014 Last OV: 7.03.2014 Next OV: Not scheduled Last Refill: 8.31.2018   Please advise

## 2017-07-14 DIAGNOSIS — M25511 Pain in right shoulder: Secondary | ICD-10-CM | POA: Diagnosis not present

## 2017-07-14 DIAGNOSIS — M542 Cervicalgia: Secondary | ICD-10-CM | POA: Diagnosis not present

## 2017-07-15 DIAGNOSIS — K5090 Crohn's disease, unspecified, without complications: Secondary | ICD-10-CM | POA: Diagnosis not present

## 2017-07-15 DIAGNOSIS — N3010 Interstitial cystitis (chronic) without hematuria: Secondary | ICD-10-CM | POA: Diagnosis not present

## 2017-07-15 DIAGNOSIS — K6289 Other specified diseases of anus and rectum: Secondary | ICD-10-CM | POA: Diagnosis not present

## 2017-07-17 ENCOUNTER — Other Ambulatory Visit: Payer: Self-pay | Admitting: Family Medicine

## 2017-07-17 DIAGNOSIS — G43009 Migraine without aura, not intractable, without status migrainosus: Secondary | ICD-10-CM

## 2017-07-21 ENCOUNTER — Other Ambulatory Visit: Payer: Self-pay | Admitting: Family Medicine

## 2017-07-21 DIAGNOSIS — M542 Cervicalgia: Secondary | ICD-10-CM | POA: Diagnosis not present

## 2017-07-21 DIAGNOSIS — F418 Other specified anxiety disorders: Secondary | ICD-10-CM

## 2017-07-21 DIAGNOSIS — M25511 Pain in right shoulder: Secondary | ICD-10-CM | POA: Diagnosis not present

## 2017-07-23 ENCOUNTER — Other Ambulatory Visit: Payer: Self-pay | Admitting: *Deleted

## 2017-07-23 DIAGNOSIS — F418 Other specified anxiety disorders: Secondary | ICD-10-CM

## 2017-07-23 MED ORDER — BUPROPION HCL ER (XL) 150 MG PO TB24: ORAL_TABLET | ORAL | 1 refills | Status: DC

## 2017-07-23 NOTE — Telephone Encounter (Signed)
[facility] requesting refill for al[facility]razolam  Database ran and is on your desk.  Last filled per database: 06/03/17 Last written: 04/08/17 Last ov: 04/15/17 Next ov: nothing scheduled Contract: not on file UDS: not file

## 2017-07-23 NOTE — Telephone Encounter (Signed)
Refill x1 

## 2017-07-24 NOTE — Telephone Encounter (Signed)
Patient notified

## 2017-08-13 DIAGNOSIS — M62838 Other muscle spasm: Secondary | ICD-10-CM | POA: Diagnosis not present

## 2017-08-13 DIAGNOSIS — R102 Pelvic and perineal pain: Secondary | ICD-10-CM | POA: Diagnosis not present

## 2017-08-13 DIAGNOSIS — M6281 Muscle weakness (generalized): Secondary | ICD-10-CM | POA: Diagnosis not present

## 2017-08-13 DIAGNOSIS — M6289 Other specified disorders of muscle: Secondary | ICD-10-CM | POA: Diagnosis not present

## 2017-08-20 ENCOUNTER — Other Ambulatory Visit: Payer: Self-pay | Admitting: Family Medicine

## 2017-08-20 DIAGNOSIS — G43009 Migraine without aura, not intractable, without status migrainosus: Secondary | ICD-10-CM

## 2017-08-20 DIAGNOSIS — F418 Other specified anxiety disorders: Secondary | ICD-10-CM

## 2017-08-20 NOTE — Telephone Encounter (Signed)
Last filled per database: Unknown  Last written: 07/23/2017 Last ov: 04/15/2017 Next ov: N/A Contract:  09/18/12 UDS: 09/18/2012

## 2017-08-28 ENCOUNTER — Other Ambulatory Visit: Payer: Self-pay | Admitting: Family Medicine

## 2017-08-28 DIAGNOSIS — M797 Fibromyalgia: Secondary | ICD-10-CM

## 2017-08-29 NOTE — Telephone Encounter (Signed)
Copied from [place]. Topic: Quick Communication - Rx Refill/Question >> Aug 29, 2017  3:49 PM Synthia Innocent wrote: Has the patient contacted their pharmacy? Yes.     (Agent: If no, request that the patient contact the pharmacy for the refill.)   Preferred Pharmacy (with phone number or street name): [facility] [address]   Agent: Please be advised that RX refills may take up to 3 business days. We ask that you follow-up with your pharmacy. Requesting refill of Tramadol

## 2017-08-29 NOTE — Telephone Encounter (Signed)
Copied from [place]. Topic: Quick Communication - Rx Refill/Question >> Aug 29, 2017  3:49 PM Synthia Innocent wrote: Has the patient contacted their pharmacy? Yes.     (Agent: If no, request that the patient contact the pharmacy for the refill.)   Preferred Pharmacy (with phone number or street name): [facility] [address]   Agent: Please be advised that RX refills may take up to 3 business days. We ask that you follow-up with your pharmacy. Requesting refill of Tramadol

## 2017-09-01 ENCOUNTER — Other Ambulatory Visit: Payer: Self-pay | Admitting: Family Medicine

## 2017-09-01 NOTE — Telephone Encounter (Signed)
Pt requesting Tramadol refill. Last refill on 06/27/17. Last OV on 04/15/17.

## 2017-09-01 NOTE — Telephone Encounter (Signed)
Pt is requesting refill on tramadol 34m.  Last OV: 04/15/2017 Last Fill: 06/27/2017 #30 and 0RF UDS: None    Unable to get into [facility].   Please advise.

## 2017-09-01 NOTE — Telephone Encounter (Signed)
It was not filled.  Last fill per database was 06/29/17

## 2017-09-01 NOTE — Telephone Encounter (Signed)
Refill x1 

## 2017-09-01 NOTE — Telephone Encounter (Signed)
Looks like it was refilled on 13th?

## 2017-09-02 NOTE — Telephone Encounter (Signed)
Patient notified by the pec that rx was faxed to pharmacy

## 2017-09-12 DIAGNOSIS — H6503 Acute serous otitis media, bilateral: Secondary | ICD-10-CM | POA: Diagnosis not present

## 2017-09-12 DIAGNOSIS — J019 Acute sinusitis, unspecified: Secondary | ICD-10-CM | POA: Diagnosis not present

## 2017-09-22 ENCOUNTER — Other Ambulatory Visit: Payer: Self-pay | Admitting: Family Medicine

## 2017-09-22 DIAGNOSIS — G43009 Migraine without aura, not intractable, without status migrainosus: Secondary | ICD-10-CM

## 2017-09-23 ENCOUNTER — Telehealth: Payer: Self-pay | Admitting: Family Medicine

## 2017-09-23 NOTE — Telephone Encounter (Signed)
Copied from [place] [id number]. Topic: Quick Communication - See Telephone Encounter >> Sep 23, 2017  9:32 AM Ahmed Prima L wrote: CRM for notification. See Telephone encounter for:   09/23/17.  topiramate (TOPAMAX) 100 MG tablet    [id number] - [place], [place] - [address] [place] [address] did fax over the request. But said they never can get the request filled.

## 2017-09-23 NOTE — Telephone Encounter (Signed)
Refill on Topamax. Does she need an office visit?  LOV was 04/15/17.

## 2017-09-23 NOTE — Telephone Encounter (Signed)
Patient picked up today at pharmacy.  Refilled sent in yesterday.

## 2017-09-25 ENCOUNTER — Other Ambulatory Visit: Payer: Self-pay | Admitting: Family Medicine

## 2017-09-25 DIAGNOSIS — J019 Acute sinusitis, unspecified: Secondary | ICD-10-CM | POA: Diagnosis not present

## 2017-09-25 DIAGNOSIS — M797 Fibromyalgia: Secondary | ICD-10-CM

## 2017-09-25 DIAGNOSIS — Z79899 Other long term (current) drug therapy: Secondary | ICD-10-CM

## 2017-09-26 NOTE — Telephone Encounter (Signed)
Last filled per database: 09/02/17 Last written:09/02/17 Last ov: 04/15/17 Next ov: N/A Contract:Yes  UDS: Needs UDS hasn't had screening since 2014

## 2017-09-26 NOTE — Telephone Encounter (Signed)
Refill x1-- needs uds

## 2017-09-29 NOTE — Telephone Encounter (Signed)
Pt added to Wednesday's lab schedule for UDS

## 2017-09-29 NOTE — Progress Notes (Signed)
Control Contract printed and upfront for Pt to sign when she comes in on the 16th for UDS

## 2017-09-29 NOTE — Addendum Note (Signed)
Addended by: Roma Kayser on: 09/29/2017 11:48 AM   Modules accepted: Orders

## 2017-09-30 ENCOUNTER — Other Ambulatory Visit: Payer: Self-pay | Admitting: Family Medicine

## 2017-09-30 DIAGNOSIS — M797 Fibromyalgia: Secondary | ICD-10-CM

## 2017-10-01 ENCOUNTER — Other Ambulatory Visit: Payer: Federal, State, Local not specified - PPO

## 2017-10-01 DIAGNOSIS — Z79899 Other long term (current) drug therapy: Secondary | ICD-10-CM

## 2017-10-05 LAB — PAIN MGMT, PROFILE 8 W/CONF, U
6 ACETYLMORPHINE: NEGATIVE ng/mL (ref <10)
ALCOHOL METABOLITES: NEGATIVE ng/mL (ref <500)
AMPHETAMINES: NEGATIVE ng/mL (ref <500)
Alphahydroxyalprazolam: 522 ng/mL — ABNORMAL HIGH (ref <25)
Alphahydroxymidazolam: NEGATIVE ng/mL (ref <50)
Alphahydroxytriazolam: NEGATIVE ng/mL (ref <50)
Aminoclonazepam: NEGATIVE ng/mL (ref <25)
Benzodiazepines: POSITIVE ng/mL — AB (ref <100)
Buprenorphine, Urine: NEGATIVE ng/mL (ref <5)
COCAINE METABOLITE: NEGATIVE ng/mL (ref <150)
CREATININE: 180.6 mg/dL
Hydroxyethylflurazepam: NEGATIVE ng/mL (ref <50)
LORAZEPAM: NEGATIVE ng/mL (ref <50)
MDMA: NEGATIVE ng/mL (ref <500)
Marijuana Metabolite: NEGATIVE ng/mL (ref <20)
NORDIAZEPAM: NEGATIVE ng/mL (ref <50)
OPIATES: NEGATIVE ng/mL (ref <100)
OXIDANT: NEGATIVE ug/mL (ref <200)
Oxazepam: NEGATIVE ng/mL (ref <50)
Oxycodone: NEGATIVE ng/mL (ref <100)
PH: 5.73 (ref 4.5–9.0)
Temazepam: NEGATIVE ng/mL (ref <50)

## 2017-10-20 ENCOUNTER — Other Ambulatory Visit: Payer: Self-pay | Admitting: Family Medicine

## 2017-10-20 DIAGNOSIS — G43009 Migraine without aura, not intractable, without status migrainosus: Secondary | ICD-10-CM

## 2017-10-20 DIAGNOSIS — F418 Other specified anxiety disorders: Secondary | ICD-10-CM

## 2017-10-22 NOTE — Telephone Encounter (Signed)
[facility] [address] requesting refill on xanax  Database ran 09/29/17 an is in media  Last written: 08/31/17 Last ov: 04/15/17 Next ov: none Contract: 10/01/17 UDS: 03/31/18

## 2017-10-29 ENCOUNTER — Other Ambulatory Visit: Payer: Self-pay | Admitting: Family Medicine

## 2017-10-29 DIAGNOSIS — M797 Fibromyalgia: Secondary | ICD-10-CM

## 2017-10-29 NOTE — Telephone Encounter (Signed)
Requesting: tramadol Contract: 10/01/17 UDS:10/01/17 low risk Last Visit: 04/15/17 Next Visit:none Last Refill: 09/26/17  Please Advise

## 2017-10-30 NOTE — Telephone Encounter (Signed)
We can refill x1 but she will need ov

## 2017-10-31 NOTE — Telephone Encounter (Signed)
Patient notified

## 2017-11-11 ENCOUNTER — Ambulatory Visit: Payer: Federal, State, Local not specified - PPO | Admitting: Internal Medicine

## 2017-11-11 ENCOUNTER — Encounter: Payer: Self-pay | Admitting: Internal Medicine

## 2017-11-11 VITALS — BP 124/78 | HR 84 | Temp 98.4°F | Resp 14 | Ht 64.0 in | Wt 134.2 lb

## 2017-11-11 DIAGNOSIS — B349 Viral infection, unspecified: Secondary | ICD-10-CM

## 2017-11-11 MED ORDER — HYDROCODONE-HOMATROPINE 5-1.5 MG/5ML PO SYRP: 5.0000 mL | ORAL_SOLUTION | Freq: Every evening | ORAL | 0 refills | Status: DC | PRN

## 2017-11-11 MED ORDER — OSELTAMIVIR PHOSPHATE 75 MG PO CAPS: 75.0000 mg | ORAL_CAPSULE | Freq: Two times a day (BID) | ORAL | 0 refills | Status: DC

## 2017-11-11 NOTE — Patient Instructions (Signed)
Rest, fluids , tylenol  For cough:  Take Mucinex DM twice a day as needed until better You can also use hydrocodone at night to help with severe cough.  If you take hydrocodone, do not take Ultram  For nasal congestion: Use OTC Nasocort or Flonase : 2 nasal sprays on each side of the nose in the morning until you feel better    Take  TAMIFLU  Call if not gradually better over the next  10 days  Call anytime if the symptoms are severe

## 2017-11-11 NOTE — Progress Notes (Signed)
Subjective:    Patient ID: Sheila [name], female    DOB: [birth year], 52 y.o.   MRN: [id number]  DOS:  11/11/2017 Type of visit - description : acute Interval history: Symptoms started 2 days ago with fever up to 101, chills, sweats. Also generalized severe muscle aches, cough worse at night and fatigue. 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  Review of Systems Denies nausea vomiting. She does have a headache and sinus/frontal pain. No rash No sputum production, no sore throat  Past Medical History:  Diagnosis Date  . Anal fissure   . Anxiety   . Chronic interstitial cystitis   . Crohn disease ([place])   . Fibromyalgia   . Migraines   . Pneumonia 2007  . SLE (systemic lupus erythematosus) ([place])     Past Surgical History:  Procedure Laterality Date  . BLADDER SURGERY    . CESAREAN SECTION    . KNEE ARTHROSCOPY    . OOPHORECTOMY     right  . TONSILLECTOMY    . WRIST SURGERY      Social History   Socioeconomic History  . Marital status: Married    Spouse name: Not on file  . Number of children: 2  . Years of education: Not on file  . Highest education level: Not on file  Social Needs  . Financial resource strain: Not on file  . Food insecurity - worry: Not on file  . Food insecurity - inability: Not on file  . Transportation needs - medical: Not on file  . Transportation needs - non-medical: Not on file  Occupational History  . Occupation: Print production planner  Tobacco Use  . Smoking status: Never Smoker  . Smokeless tobacco: Never Used  Substance and Sexual Activity  . Alcohol use: No    Alcohol/week: 0.0 oz  . Drug use: No  . Sexual activity: Not on file  Other Topics Concern  . Not on file  Social History Narrative  . Not on file      Allergies as of 11/11/2017      Reactions   Ampicillin    Cymbalta [duloxetine Hcl] Other (See Comments)   Constipation, bloating, nausea   Fentanyl Citrate    REACTION: vomiting, fever   Penicillins       Medication List        Accurate as of 11/11/17 11:59 PM. Always use your most recent med list.          ALPRAZolam 0.5 MG tablet Commonly known as:  XANAX TAKE 1 TABLET BY MOUTH THREE TIMES DAILY AS NEEDED   buPROPion 150 MG 24 hr tablet Commonly known as:  WELLBUTRIN XL Take 1 to 2 tablets by mouth daily   HYDROcodone-homatropine 5-1.5 MG/5ML syrup Commonly known as:  HYCODAN Take 5 mLs by mouth at bedtime as needed for cough.   OGESTREL 0.5-50 MG-MCG tablet Generic drug:  norgestrel-ethinyl estradiol Take 1 tablet by mouth daily.   oseltamivir 75 MG capsule Commonly known as:  TAMIFLU Take 1 capsule (75 mg total) by mouth 2 (two) times daily.   tiZANidine 4 MG tablet Commonly known as:  ZANAFLEX 1 po bid prn   topiramate 100 MG tablet Commonly known as:  TOPAMAX TAKE 1 TABLET(100 MG) BY MOUTH DAILY   traMADol 50 MG tablet Commonly known as:  ULTRAM TAKE 1 TO 2 TABLETS BY MOUTH EVERY 6 HOURS AS NEEDED FOR PAIN   traZODone 50 MG tablet Commonly known  as:  DESYREL 1-2 po qhs   triamcinolone cream 0.1 % Commonly known as:  KENALOG Apply 1 application topically 2 (two) times daily.          Objective:   Physical Exam BP 124/78 (BP Location: Left Arm, Patient Position: Sitting, Cuff Size: Small)   Pulse 84   Temp 98.4 F (36.9 C) (Oral)   Resp 14   Ht 5' 4"  (1.626 m)   Wt 134 lb 4 oz (60.9 kg)   SpO2 97%   BMI 23.04 kg/m  General:   Well developed, well nourished . NAD.  HEENT:  Normocephalic . Face symmetric, atraumatic.  TMs normal, nose congested, sinuses: Slightly TTP at the frontal area.  Throat symmetric, not red Neck: Full range of motion Lungs:  CTA B Normal respiratory effort, no intercostal retractions, no accessory muscle use. Heart: RRR,  no murmur.  No pretibial edema bilaterally  Skin: Not pale. Not jaundice Neurologic:  alert & oriented X3.  Speech normal, gait appropriate for age and unassisted Psych--    Cognition and judgment appear intact.  Cooperative with normal attention span and concentration.  Behavior appropriate. No anxious or depressed appearing.      Assessment & Plan:    52 y/o female, several med problems including lupus, anxiety, Crohn's , migraines, FM, not on immunosuppressants presents with:  Viral syndrome: Likely influenza, in addition to supportive treatment will prescribe Tamiflu and hydrocodone at bedtime.  See AVS.  Aware that these has the potential to get worse with complications such as pneumonias or sinusitis.  Will call if not gradually better or if the symptoms resurface after she gets well.

## 2017-11-11 NOTE — Progress Notes (Signed)
Pre visit review using our clinic review tool, if applicable. No additional management support is needed unless otherwise documented below in the visit note. 

## 2017-11-18 ENCOUNTER — Ambulatory Visit: Payer: Federal, State, Local not specified - PPO | Admitting: Family Medicine

## 2017-11-18 ENCOUNTER — Encounter: Payer: Self-pay | Admitting: Family Medicine

## 2017-11-18 ENCOUNTER — Ambulatory Visit (HOSPITAL_BASED_OUTPATIENT_CLINIC_OR_DEPARTMENT_OTHER)
Admission: RE | Admit: 2017-11-18 | Discharge: 2017-11-18 | Disposition: A | Discharge status: Home / Self Care | Payer: Federal, State, Local not specified - PPO | Source: Ambulatory Visit | Attending: Family Medicine | Admitting: Family Medicine

## 2017-11-18 ENCOUNTER — Other Ambulatory Visit: Payer: Self-pay | Admitting: Family Medicine

## 2017-11-18 VITALS — BP 131/73 | HR 81 | Temp 98.1°F | Resp 16 | Ht 64.0 in | Wt 134.0 lb

## 2017-11-18 DIAGNOSIS — F411 Generalized anxiety disorder: Secondary | ICD-10-CM

## 2017-11-18 DIAGNOSIS — F419 Anxiety disorder, unspecified: Secondary | ICD-10-CM | POA: Diagnosis not present

## 2017-11-18 DIAGNOSIS — J40 Bronchitis, not specified as acute or chronic: Secondary | ICD-10-CM

## 2017-11-18 DIAGNOSIS — R9389 Abnormal findings on diagnostic imaging of other specified body structures: Secondary | ICD-10-CM

## 2017-11-18 DIAGNOSIS — G43009 Migraine without aura, not intractable, without status migrainosus: Secondary | ICD-10-CM | POA: Diagnosis not present

## 2017-11-18 DIAGNOSIS — J449 Chronic obstructive pulmonary disease, unspecified: Secondary | ICD-10-CM | POA: Diagnosis not present

## 2017-11-18 DIAGNOSIS — Z789 Other specified health status: Secondary | ICD-10-CM

## 2017-11-18 DIAGNOSIS — G4700 Insomnia, unspecified: Secondary | ICD-10-CM | POA: Diagnosis not present

## 2017-11-18 DIAGNOSIS — F418 Other specified anxiety disorders: Secondary | ICD-10-CM | POA: Diagnosis not present

## 2017-11-18 DIAGNOSIS — R05 Cough: Secondary | ICD-10-CM | POA: Diagnosis not present

## 2017-11-18 MED ORDER — ALPRAZOLAM 0.5 MG PO TABS: 0.5000 mg | ORAL_TABLET | Freq: Three times a day (TID) | ORAL | 0 refills | Status: DC | PRN

## 2017-11-18 MED ORDER — SERTRALINE HCL 50 MG PO TABS: 50.0000 mg | ORAL_TABLET | Freq: Every day | ORAL | 3 refills | Status: DC

## 2017-11-18 MED ORDER — HYDROCODONE-HOMATROPINE 5-1.5 MG/5ML PO SYRP: 5.0000 mL | ORAL_SOLUTION | Freq: Every evening | ORAL | 0 refills | Status: DC | PRN

## 2017-11-18 MED ORDER — TIZANIDINE HCL 4 MG PO TABS: ORAL_TABLET | ORAL | 1 refills | Status: DC

## 2017-11-18 MED ORDER — TRAZODONE HCL 50 MG PO TABS: ORAL_TABLET | ORAL | 1 refills | Status: DC

## 2017-11-18 MED ORDER — AZITHROMYCIN 250 MG PO TABS: ORAL_TABLET | ORAL | 0 refills | Status: DC

## 2017-11-18 NOTE — Assessment & Plan Note (Signed)
List of names and numbers of psych given to the pt Start zoloft -- cont bzd prn rto prn  Pt is not suicidal

## 2017-11-18 NOTE — Progress Notes (Signed)
Patient ID: Sheila [name], female    DOB: 06-Aug-1966  Age: 52 y.o. MRN: [id number]    Subjective:  Subjective  HPI Sheila [name] presents for f/u flu.  Symptoms worsened--+ productive cough, no more fevers Pt also c/o worsening anxiety and panic attacks-- she is willing to see psych  Review of Systems  Constitutional: Negative for appetite change, diaphoresis, fatigue and unexpected weight change.  Eyes: Negative for pain, redness and visual disturbance.  Respiratory: Positive for cough and wheezing. Negative for chest tightness and shortness of breath.   Cardiovascular: Negative for chest pain, palpitations and leg swelling.  Endocrine: Negative for cold intolerance, heat intolerance, polydipsia, polyphagia and polyuria.  Genitourinary: Negative for difficulty urinating, dysuria and frequency.  Neurological: Negative for dizziness, light-headedness, numbness and headaches.  Psychiatric/Behavioral: Positive for dysphoric mood and sleep disturbance. Negative for agitation, confusion, decreased concentration, self-injury and suicidal ideas. The patient is nervous/anxious.     History Past Medical History:  Diagnosis Date  . Anal fissure   . Anxiety   . Chronic interstitial cystitis   . Crohn disease ([place])   . Fibromyalgia   . Migraines   . Pneumonia 2007  . SLE (systemic lupus erythematosus) ([place])     She has a past surgical history that includes Cesarean section; Knee arthroscopy; Wrist surgery; Oophorectomy; Bladder surgery; and Tonsillectomy.   Her family history includes Breast cancer in her maternal aunt, maternal grandmother, and mother; Irritable bowel syndrome in her sister; Lung cancer in her father; Prostate cancer in her maternal grandfather.She reports that  has never smoked. she has never used smokeless tobacco. She reports that she does not drink alcohol or use drugs.  Current Outpatient Medications on File Prior to Visit  Medication Sig Dispense Refill  .  OGESTREL 0.5-50 MG-MCG tablet Take 1 tablet by mouth daily.    Marland Kitchen topiramate (TOPAMAX) 100 MG tablet TAKE 1 TABLET(100 MG) BY MOUTH DAILY 30 tablet 2  . traMADol (ULTRAM) 50 MG tablet TAKE 1 TO 2 TABLETS BY MOUTH EVERY 6 HOURS AS NEEDED FOR PAIN 30 tablet 0  . triamcinolone cream (KENALOG) 0.1 % Apply 1 application topically 2 (two) times daily. 45 g 3   No current facility-administered medications on file prior to visit.      Objective:  Objective  Physical Exam  Constitutional: She is oriented to person, place, and time. She appears well-developed and well-nourished. No distress.  HENT:  Right Ear: External ear normal.  Left Ear: External ear normal.  Nose: Nose normal.  Mouth/Throat: Oropharynx is clear and moist.  Eyes: EOM are normal. Pupils are equal, round, and reactive to light.  Neck: Normal range of motion. Neck supple.  Cardiovascular: Normal rate, regular rhythm and normal heart sounds.  No murmur heard. Pulmonary/Chest: Effort normal. No respiratory distress. She has wheezes. She has rhonchi. She has no rales. She exhibits no tenderness.  Neurological: She is alert and oriented to person, place, and time.  Psychiatric: Her behavior is normal. Judgment and thought content normal. Her mood appears anxious. She exhibits a depressed mood. She expresses no homicidal and no suicidal ideation. She expresses no suicidal plans and no homicidal plans.  Nursing note and vitals reviewed.  BP 131/73 (BP Location: Left Arm, Patient Position: Sitting, Cuff Size: Normal)   Pulse 81   Temp 98.1 F (36.7 C) (Oral)   Resp 16   Ht 5' 4"  (1.626 m)   Wt 134 lb (60.8 kg)   LMP  (LMP Unknown)  SpO2 99%   BMI 23.00 kg/m  Wt Readings from Last 3 Encounters:  11/18/17 134 lb (60.8 kg)  11/11/17 134 lb 4 oz (60.9 kg)  04/15/17 133 lb 2 oz (60.4 kg)     Lab Results  Component Value Date   WBC 7.6 02/13/2017   HGB 14.4 02/13/2017   HCT 43.4 02/13/2017   PLT 293.0 02/13/2017   GLUCOSE  88 02/13/2017   ALT 28 02/13/2017   AST 28 02/13/2017   NA 139 02/13/2017   K 4.0 02/13/2017   CL 108 02/13/2017   CREATININE 0.95 02/13/2017   BUN 14 02/13/2017   CO2 26 02/13/2017   TSH 0.65 02/13/2017    Mr Pelvis W Wo Contrast  Result Date: 03/08/2017 CLINICAL DATA:  Rectal pain.  Reported history of Crohn disease. EXAM: MRI PELVIS WITHOUT AND WITH CONTRAST TECHNIQUE: Multiplanar multisequence MR imaging of the pelvis was performed both before and after administration of intravenous contrast. CONTRAST:  22m MULTIHANCE GADOBENATE DIMEGLUMINE 529 MG/ML IV SOLN COMPARISON:  None. FINDINGS: Urinary Tract: Collapsed and grossly normal bladder. Normal urethra. Bowel: No evidence of perianal or perirectal fistula or abscess. No evidence of wall thickening, wall enhancement or dilatation in the visualized pelvic bowel loops. Vascular/Lymphatic: No pathologically enlarged pelvic lymph nodes. No significant vascular abnormality seen. Reproductive: Retroverted retroflexed enlarged globular uterus measures 10.5 x 5.7 x 6.2 cm. Cesarean scar is seen in the anterior lower uterine segment. Multiple small uterine fibroids, with representative fibroids as follows: - right posterior uterine body intramural 1.7 x 1.6 x 1.3 cm fibroid - left posterior fundal intramural 1.2 x 1.1 x 1.1 cm fibroid - exophytic right anterior upper uterine 1.5 x 1.3 x 1.3 cm fibroid (versus right adnexal broad ligament fibroid) No submucosal or intracavitary fibroids. Bilayer endometrial thickness 5 mm. No endometrial cavity fluid or focal endometrial mass. Prominently thickened junctional zone (26 mm thickness), compatible with diffuse adenomyosis of the uterus, asymmetrically prominent in the posterior uterus. No gross abnormality in the cervix. Right ovary measures 2.1 x 2.0 x 1.0 cm. Left ovary measures 3.0 x 2.1 x 2.2 cm and contains a dominant 1.8 cm follicular cyst. No suspicious ovarian or adnexal masses. Other:  No free fluid in  the pelvis.  No focal fluid collections. Musculoskeletal: No suspicious bone lesions identified. IMPRESSION: 1. No evidence of perianal or perirectal fistula or abscess. No pelvic bowel abnormality detected. 2. Enlarged globular retroverted uterus with diffuse uterine adenomyosis and multiple small fibroids, including a small exophytic upper right uterine fibroid versus right broad ligament fibroid. 3. No suspicious ovarian or adnexal masses. Electronically Signed   By: JIlona SorrelM.D.   On: 03/08/2017 09:53     Assessment & Plan:  Plan  I have discontinued CAliciannaA. Gulley's buPROPion and oseltamivir. I have also changed her ALPRAZolam. Additionally, I am having her start on sertraline and azithromycin. Lastly, I am having her maintain her OGESTREL, triamcinolone cream, topiramate, traMADol, traZODone, tiZANidine, and HYDROcodone-homatropine.  Meds ordered this encounter  Medications  . sertraline (ZOLOFT) 50 MG tablet    Sig: Take 1 tablet (50 mg total) by mouth daily.    Dispense:  30 tablet    Refill:  3  . azithromycin (ZITHROMAX Z-PAK) 250 MG tablet    Sig: As directed    Dispense:  6 each    Refill:  0  . traZODone (DESYREL) 50 MG tablet    Sig: 1-2 po qhs    Dispense:  60 tablet  Refill:  1  . tiZANidine (ZANAFLEX) 4 MG tablet    Sig: 1 po bid prn    Dispense:  60 tablet    Refill:  1  . DISCONTD: HYDROcodone-homatropine (HYCODAN) 5-1.5 MG/5ML syrup    Sig: Take 5 mLs by mouth at bedtime as needed for cough.    Dispense:  90 mL    Refill:  0  . ALPRAZolam (XANAX) 0.5 MG tablet    Sig: Take 1 tablet (0.5 mg total) by mouth 3 (three) times daily as needed.    Dispense:  90 tablet    Refill:  0  . HYDROcodone-homatropine (HYCODAN) 5-1.5 MG/5ML syrup    Sig: Take 5 mLs by mouth at bedtime as needed for cough.    Dispense:  90 mL    Refill:  0    Problem List Items Addressed This Visit      Unprioritized   Anxiety state    List of names and numbers of psych given to  the pt Start zoloft -- cont bzd prn rto prn  Pt is not suicidal       Relevant Medications   sertraline (ZOLOFT) 50 MG tablet   traZODone (DESYREL) 50 MG tablet   ALPRAZolam (XANAX) 0.5 MG tablet   Depression with anxiety   Relevant Medications   sertraline (ZOLOFT) 50 MG tablet   traZODone (DESYREL) 50 MG tablet   ALPRAZolam (XANAX) 0.5 MG tablet   Migraines   Relevant Medications   sertraline (ZOLOFT) 50 MG tablet   traZODone (DESYREL) 50 MG tablet   tiZANidine (ZANAFLEX) 4 MG tablet    Other Visit Diagnoses    Anxiety    -  Primary   Relevant Medications   sertraline (ZOLOFT) 50 MG tablet   traZODone (DESYREL) 50 MG tablet   ALPRAZolam (XANAX) 0.5 MG tablet   Bronchitis       Relevant Medications   azithromycin (ZITHROMAX Z-PAK) 250 MG tablet   HYDROcodone-homatropine (HYCODAN) 5-1.5 MG/5ML syrup   Other Relevant Orders   DG Chest 2 View (Completed)   Insomnia, unspecified type       Relevant Medications   traZODone (DESYREL) 50 MG tablet      Follow-up: Return in about 4 weeks (around 12/16/2017), or if symptoms worsen or fail to improve, for anxiety.  Ann Held, DO

## 2017-11-18 NOTE — Patient Instructions (Signed)

## 2017-11-20 MED ORDER — FLUTICASONE-SALMETEROL 250-50 MCG/DOSE IN AEPB: 1.0000 | INHALATION_SPRAY | Freq: Two times a day (BID) | RESPIRATORY_TRACT | 5 refills | Status: DC

## 2017-11-20 NOTE — Telephone Encounter (Signed)
We can order PFTs  Can try advair 250/50 1 inh bid

## 2017-11-20 NOTE — Telephone Encounter (Signed)
Can you help with explaining copd to her with out her ever smoking or do you want her just to do and office visit to explain and to the PFT.  It looks like back in 2012 this was on her CXR.

## 2017-11-24 ENCOUNTER — Telehealth: Payer: Self-pay | Admitting: *Deleted

## 2017-11-24 NOTE — Telephone Encounter (Signed)
It will help with inflammation in the lungs Try tessalon perles 200 mg 1 po tid prn

## 2017-11-24 NOTE — Telephone Encounter (Signed)
You written an rx for cough syrup on 11/18/17 but it is too early.  She started out taking 1 tablespoon and not measuring medication right.  She is out of meds for cough.  Is there anything she can do for the cough?     She also would like to know what will the advair do to help her?  She said she spoke with the pharmacist.  She will read online but if you can help explain that will be great.

## 2017-11-25 MED ORDER — BENZONATATE 200 MG PO CAPS: 200.0000 mg | ORAL_CAPSULE | Freq: Three times a day (TID) | ORAL | 0 refills | Status: DC | PRN

## 2017-11-25 NOTE — Telephone Encounter (Signed)
Patient notified and rx sent in

## 2017-12-03 ENCOUNTER — Ambulatory Visit (INDEPENDENT_AMBULATORY_CARE_PROVIDER_SITE_OTHER): Payer: Federal, State, Local not specified - PPO | Admitting: Internal Medicine

## 2017-12-03 DIAGNOSIS — R05 Cough: Secondary | ICD-10-CM

## 2017-12-03 DIAGNOSIS — R059 Cough, unspecified: Secondary | ICD-10-CM

## 2017-12-03 LAB — PULMONARY FUNCTION TEST
DL/VA % pred: 104 %
DL/VA: 5 ml/min/mmHg/L
DLCO unc % pred: 101 %
DLCO unc: 24.66 ml/min/mmHg
FEF 25-75 Post: 1.73 L/sec
FEF 25-75 Pre: 1.31 L/sec
FEF2575-%Change-Post: 31 %
FEF2575-%PRED-PRE: 47 %
FEF2575-%Pred-Post: 62 %
FEV1-%Change-Post: 2 %
FEV1-%PRED-PRE: 77 %
FEV1-%Pred-Post: 79 %
FEV1-PRE: 2.17 L
FEV1-Post: 2.23 L
FEV1FVC-%CHANGE-POST: -4 %
FEV1FVC-%Pred-Pre: 78 %
FEV6-%CHANGE-POST: 7 %
FEV6-%PRED-POST: 107 %
FEV6-%PRED-PRE: 100 %
FEV6-Post: 3.72 L
FEV6-Pre: 3.47 L
FEV6FVC-%Pred-Post: 103 %
FEV6FVC-%Pred-Pre: 103 %
FVC-%Change-Post: 7 %
FVC-%Pred-Post: 105 %
FVC-%Pred-Pre: 98 %
FVC-Post: 3.72 L
FVC-Pre: 3.47 L
Post FEV1/FVC ratio: 60 %
Post FEV6/FVC ratio: 100 %
Pre FEV1/FVC ratio: 63 %
Pre FEV6/FVC Ratio: 100 %
RV % pred: 139 %
RV: 2.48 L
TLC % pred: 116 %
TLC: 5.89 L

## 2017-12-03 NOTE — Progress Notes (Signed)
PFT completed today per Dr. Etter Sjogren.

## 2017-12-04 ENCOUNTER — Encounter: Payer: Self-pay | Admitting: Family Medicine

## 2017-12-05 NOTE — Telephone Encounter (Signed)
See pft

## 2017-12-05 NOTE — Telephone Encounter (Signed)
Result Notes for Pulmonary function test   Notes recorded by Ewing, Robin B, CMA on 12/05/2017 at 2:51 PM EDT Called left message to call back. ------  Notes recorded by Ann Held, DO on 12/05/2017 at 2:46 PM EDT Pulmonologist read it as mild copd ---- no asthma  But the numbers are pretty normal  We can refer to pulmonary if she is still having problems but the advair should help

## 2017-12-09 ENCOUNTER — Other Ambulatory Visit: Payer: Self-pay | Admitting: *Deleted

## 2017-12-09 DIAGNOSIS — J449 Chronic obstructive pulmonary disease, unspecified: Secondary | ICD-10-CM

## 2017-12-26 ENCOUNTER — Encounter: Payer: Self-pay | Admitting: Medical

## 2017-12-26 ENCOUNTER — Ambulatory Visit: Payer: Federal, State, Local not specified - PPO | Admitting: Medical

## 2017-12-26 ENCOUNTER — Other Ambulatory Visit: Payer: Self-pay | Admitting: Medical

## 2017-12-26 VITALS — BP 102/62 | HR 80 | Temp 98.3°F | Resp 16 | Ht 64.0 in | Wt 130.2 lb

## 2017-12-26 DIAGNOSIS — H938X2 Other specified disorders of left ear: Secondary | ICD-10-CM

## 2017-12-26 DIAGNOSIS — J301 Allergic rhinitis due to pollen: Secondary | ICD-10-CM | POA: Diagnosis not present

## 2017-12-26 DIAGNOSIS — J40 Bronchitis, not specified as acute or chronic: Secondary | ICD-10-CM

## 2017-12-26 DIAGNOSIS — H6122 Impacted cerumen, left ear: Secondary | ICD-10-CM

## 2017-12-26 MED ORDER — NEOMYCIN-POLYMYXIN-HC 1 % OT SOLN: 3.0000 [drp] | Freq: Three times a day (TID) | OTIC | 0 refills | Status: DC

## 2017-12-26 MED ORDER — LEVOCETIRIZINE DIHYDROCHLORIDE 5 MG PO TABS: 5.0000 mg | ORAL_TABLET | Freq: Every evening | ORAL | 0 refills | Status: DC

## 2017-12-26 MED ORDER — SCOPOLAMINE 1 MG/3DAYS TD PT72: 1.0000 | MEDICATED_PATCH | TRANSDERMAL | 0 refills | Status: DC

## 2017-12-26 MED ORDER — AZITHROMYCIN 250 MG PO TABS: ORAL_TABLET | ORAL | 0 refills | Status: DC

## 2017-12-26 NOTE — Progress Notes (Signed)
Subjective:    Patient ID: Sheila [name], female    DOB: 08-17-1966, 52 y.o.   MRN: [id number]  HPI  Pt in with one one week  of ears pressure but over 3 days of deep ear pain. Last night hard to sleep on left side due to pain. Decreased hearing.   Pt has some allergic rhinitis symptoms. Mild nasal congestion but not severe. Mostly itching eyes for one week. Pt not on antihistamine.  Pt is traveling for cruise in about 10 days.   Review of Systems  Constitutional: Negative for chills, fatigue and fever.  HENT: Positive for congestion, ear pain and sneezing. Negative for nosebleeds, postnasal drip, rhinorrhea, sinus pressure, sinus pain and sore throat.   Respiratory: Negative for cough, shortness of breath and wheezing.        No recent pulmonary symptoms. But update me on recent copd diagnosis. Seen on cxr. She has pulmonary function test.     Past Medical History:  Diagnosis Date  . Anal fissure   . Anxiety   . Chronic interstitial cystitis   . Crohn disease ([place])   . Fibromyalgia   . Migraines   . Pneumonia 2005-11-19  . SLE (systemic lupus erythematosus) ([facility])      Social History   Socioeconomic History  . Marital status: Married    Spouse name: Not on file  . Number of children: 2  . Years of education: Not on file  . Highest education level: Not on file  Occupational History  . Occupation: Print production planner  Social Needs  . Financial resource strain: Not on file  . Food insecurity:    Worry: Not on file    Inability: Not on file  . Transportation needs:    Medical: Not on file    Non-medical: Not on file  Tobacco Use  . Smoking status: Never Smoker  . Smokeless tobacco: Never Used  Substance and Sexual Activity  . Alcohol use: No    Alcohol/week: 0.0 oz  . Drug use: No  . Sexual activity: Not on file  Lifestyle  . Physical activity:    Days per week: Not on file    Minutes per session: Not on file  . Stress: Not on file  Relationships  . Social  connections:    Talks on phone: Not on file    Gets together: Not on file    Attends religious service: Not on file    Active member of club or organization: Not on file    Attends meetings of clubs or organizations: Not on file    Relationship status: Not on file  . Intimate partner violence:    Fear of current or ex partner: Not on file    Emotionally abused: Not on file    Physically abused: Not on file    Forced sexual activity: Not on file  Other Topics Concern  . Not on file  Social History Narrative  . Not on file    Past Surgical History:  Procedure Laterality Date  . BLADDER SURGERY    . CESAREAN SECTION    . KNEE ARTHROSCOPY    . OOPHORECTOMY     right  . TONSILLECTOMY    . WRIST SURGERY      Family History  Problem Relation Age of Onset  . Breast cancer Mother   . Lung cancer Father        Died in 20-Nov-2011  . Breast cancer Maternal Grandmother   . Breast  cancer Maternal Aunt   . Prostate cancer Maternal Grandfather   . Irritable bowel syndrome Sister   . Colon cancer Neg Hx   . Colon polyps Neg Hx   . Esophageal cancer Neg Hx   . Kidney disease Neg Hx   . Gallbladder disease Neg Hx     Allergies  Allergen Reactions  . Ampicillin   . Cymbalta [Duloxetine Hcl] Other (See Comments)    Constipation, bloating, nausea  . Fentanyl Citrate     REACTION: vomiting, fever  . Penicillins     Current Outpatient Medications on File Prior to Visit  Medication Sig Dispense Refill  . ALPRAZolam (XANAX) 0.5 MG tablet Take 1 tablet (0.5 mg total) by mouth 3 (three) times daily as needed. 90 tablet 0  . azithromycin (ZITHROMAX Z-PAK) 250 MG tablet As directed 6 each 0  . benzonatate (TESSALON) 200 MG capsule Take 1 capsule (200 mg total) by mouth 3 (three) times daily as needed for cough. 30 capsule 0  . Fluticasone-Salmeterol (ADVAIR DISKUS) 250-50 MCG/DOSE AEPB Inhale 1 puff into the lungs 2 (two) times daily. 60 each 5  . HYDROcodone-homatropine (HYCODAN) 5-1.5  MG/5ML syrup Take 5 mLs by mouth at bedtime as needed for cough. 90 mL 0  . OGESTREL 0.5-50 MG-MCG tablet Take 1 tablet by mouth daily.    . sertraline (ZOLOFT) 50 MG tablet Take 1 tablet (50 mg total) by mouth daily. 30 tablet 3  . tiZANidine (ZANAFLEX) 4 MG tablet 1 po bid prn 60 tablet 1  . topiramate (TOPAMAX) 100 MG tablet TAKE 1 TABLET(100 MG) BY MOUTH DAILY 30 tablet 2  . traMADol (ULTRAM) 50 MG tablet TAKE 1 TO 2 TABLETS BY MOUTH EVERY 6 HOURS AS NEEDED FOR PAIN 30 tablet 0  . traZODone (DESYREL) 50 MG tablet 1-2 po qhs 60 tablet 1  . triamcinolone cream (KENALOG) 0.1 % Apply 1 application topically 2 (two) times daily. 45 g 3   No current facility-administered medications on file prior to visit.     BP 102/62   Pulse 80   Temp 98.3 F (36.8 C) (Oral)   Resp 16   Ht 5' 4"  (1.626 m)   Wt 130 lb 3.2 oz (59.1 kg)   SpO2 100%   BMI 22.35 kg/m       Objective:   Physical Exam  General  Mental Status - Alert. General Appearance - Well groomed. Not in acute distress.  Skin Rashes- No Rashes.  HEENT Head- Normal. Ear Auditory Canal - Left-obstructed with wax.  Cannot visualize any portion of TM right - Normal.Tympanic Membrane- Left-left ear has moderate amount of wax present.  Cannot see any portion of the TM.  Upon insertion of otoscope describes moderate pain.  Also some pain just beneath left ear on palpation.  Right- Normal. Eye Sclera/Conjunctiva- Left- Normal. Right- Normal. Nose & Sinuses Nasal Mucosa- Left-  Boggy and Congested. Right-  Boggy and  Congested.Bilateral  No maxillary and  No frontal sinus pressure. Mouth & Throat Lips: Upper Lip- Normal: no dryness, cracking, pallor, cyanosis, or vesicular eruption. Lower Lip-Normal: no dryness, cracking, pallor, cyanosis or vesicular eruption. Buccal Mucosa- Bilateral- No Aphthous ulcers. Oropharynx- No Discharge or Erythema.  Positive postnasal drainage  tonsils: Characteristics- Bilateral- No Erythema or  Congestion. Size/Enlargement- Bilateral- No enlargement. Discharge- bilateral-None.  Neck Neck- Supple. No Masses.   Chest and Lung Exam Auscultation: Breath Sounds:-Clear even and unlabored.  Cardiovascular Auscultation:Rythm- Regular, rate and rhythm. Murmurs & Other Heart Sounds:Ausculatation of  the heart reveal- No Murmurs.  Lymphatic Head & Neck General Head & Neck Lymphatics: Bilateral: Description- No Localized lymphadenopathy.       Assessment & Plan:  You do appear to have some recent mild allergic rhinitis signs and symptoms.  I do recommend that you start your Flonase and use that daily.  Also decided to prescribe Xyzal.  This might help with your itching eyes as well as other allergy symptoms.  You do have moderate deep hard wax present in the left ear canal.  Canal does look a little swollen.  Await your high level of the ear pain I am concerned that you have ear infection.  I do not think you can tolerate ear lavage at this time.  I am prescribing Cortisporin otic drops and a azithromycin antibiotic.  Also want you to get Debrox over-the-counter and use it twice daily to start to soften the wax.   Follow-up on Wednesday to recheck ear and likely do ear lavage.  If unable to clear wax and still symptomatic then would try to get you in with ENT potentially prior to your upcoming vacation.  Scopolamine patch prescription sent to your pharmacy for the cruise.  Mackie Pai, PA-C

## 2017-12-26 NOTE — Patient Instructions (Addendum)
You do appear to have some recent mild allergic rhinitis signs and symptoms.  I do recommend that you start your Flonase and use that daily.  Also decided to prescribe Xyzal.  This might help with your itching eyes as well as other allergy symptoms.  You do have moderate deep hard wax present in the left ear canal.  Canal does look a little swollen.  Await your high level of the ear pain I am concerned that you have ear infection.  I do not think you can tolerate ear lavage at this time.  I am prescribing Cortisporin otic drops and a azithromycin antibiotic.  Also want you to get Debrox over-the-counter and use it twice daily to start to soften the wax.   Follow-up on Wednesday to recheck ear and likely do ear lavage.  If unable to clear wax and still symptomatic then would try to get you in with ENT potentially prior to your upcoming vacation.  Scopolamine patch prescription sent to your pharmacy for the cruise.

## 2017-12-29 ENCOUNTER — Ambulatory Visit: Payer: Federal, State, Local not specified - PPO | Admitting: Pulmonary Disease

## 2017-12-29 ENCOUNTER — Encounter: Payer: Self-pay | Admitting: Pulmonary Disease

## 2017-12-29 VITALS — BP 135/95 | HR 82 | Ht 64.0 in | Wt 131.0 lb

## 2017-12-29 DIAGNOSIS — R05 Cough: Secondary | ICD-10-CM

## 2017-12-29 DIAGNOSIS — R0600 Dyspnea, unspecified: Secondary | ICD-10-CM

## 2017-12-29 DIAGNOSIS — K50919 Crohn's disease, unspecified, with unspecified complications: Secondary | ICD-10-CM

## 2017-12-29 DIAGNOSIS — R059 Cough, unspecified: Secondary | ICD-10-CM

## 2017-12-29 DIAGNOSIS — M329 Systemic lupus erythematosus, unspecified: Secondary | ICD-10-CM | POA: Diagnosis not present

## 2017-12-29 NOTE — Patient Instructions (Signed)
Shortness of breath: I think for now it safest to assume that you have asthma so continue taking Advair twice a day As we discussed today I am going to assess for other conditions like bronchiolitis, bronchiectasis, and alpha-1 antitrypsin deficiency with a high-resolution CT scan of your chest and blood testing.  We will plan on seeing you back in 1-2 weeks to go over the results of the CT scan and blood work.

## 2017-12-29 NOTE — Progress Notes (Signed)
Subjective:   PATIENT ID: Sheila [name] GENDER: female DOB: Feb 11, 1966, MRN: [id number]  Synopsis: Referred in April 2019 for Cough   HPI  Chief Complaint  Patient presents with  . Consult    Referred by Dr. Carollee Herter for mild copd. CAT: 83.    Sheila [name] was sent to me fo revaluation of an abnormal PFT.  She says that in 12-03-2017 she had influenza and had a persistent cough afterwards.  She says the cough was "really bad" and didn't go away so she had a chest x-ray.  She says that the test showed "COPD" so this alarmed her.  She says taht she had pneumonia once while pregnant for her second son.  She was on breathing treatments at this time.  She was not hospitalized.    She says that developed shortness of breath and chest tightness for a few months.  She noticed dyspnea while climbing a flight of stairs for several months (prior to the flu).  Since having the flu she says that she is severely fatigued and is crashing on her couch for 2-3 hours at a time during the daytime.  She says that she doesn't think she is sleeping very well.   Dyspnea: > as above > she says that walking on level ground she will not keep up with there kids > she feels short of breath climbing stairs > she says that carrying weight makes her more short of breath > the dyspnea hasn't worsened, she thinks it has stayed same  Cough: She has been taking inhalers lately (Advair).  She has been taking cough syrup lately (hycodan).  She has been taking an prescription antihistamine.  She is taking flonase daily.  Despite taking all these medicines she still coughs a lot.  She doesn't cough up mucus. She was coughing up mucus at the beginning.  Sinus problems: She had ear pain last week and was told she had allergies She started taking Xyzal  She was told to take flonase reguarly last week, she had used it prn prior  Multiple connective tissue disorders: > "I dropped my rheumatologist when I got pregnant  with my second child because she told me to terminate my pregnancy".  That was 13 years ago > she sees Dr. Alona Bene for interstitial cystits and fibromyalgia and lupus, he is a urologist > she was on plaquenil about 12 years ago  Crohns > she sees Dr. Ardis Hughs   She has worked in a Production designer, theatre/television/film.  She takes ogestreol and topamax for migraines.  She has been on this combination for years.    As a child she had no respiratory problems.    Anxiety: > worse lately  She tells me that she had interstitial cystitis, crohns and lupus.  She has been dealing with various autoimmune conditions since age 87.   Past Medical History:  Diagnosis Date  . Anal fissure   . Anxiety   . Chronic interstitial cystitis   . Crohn disease ([place])   . Fibromyalgia   . Migraines   . Pneumonia 12-03-2005  . SLE (systemic lupus erythematosus) ([facility])      Family History  Problem Relation Age of Onset  . Breast cancer Mother   . Lung cancer Father        Died in 04-Dec-2011  . Breast cancer Maternal Grandmother   . Breast cancer Maternal Aunt   . Prostate cancer Maternal Grandfather   . Irritable bowel syndrome  Sister   . Colon cancer Neg Hx   . Colon polyps Neg Hx   . Esophageal cancer Neg Hx   . Kidney disease Neg Hx   . Gallbladder disease Neg Hx      Social History   Socioeconomic History  . Marital status: Married    Spouse name: Not on file  . Number of children: 2  . Years of education: Not on file  . Highest education level: Not on file  Occupational History  . Occupation: Print production planner  Social Needs  . Financial resource strain: Not on file  . Food insecurity:    Worry: Not on file    Inability: Not on file  . Transportation needs:    Medical: Not on file    Non-medical: Not on file  Tobacco Use  . Smoking status: Never Smoker  . Smokeless tobacco: Never Used  Substance and Sexual Activity  . Alcohol use: No    Alcohol/week: 0.0 oz  . Drug use:  No  . Sexual activity: Not on file  Lifestyle  . Physical activity:    Days per week: Not on file    Minutes per session: Not on file  . Stress: Not on file  Relationships  . Social connections:    Talks on phone: Not on file    Gets together: Not on file    Attends religious service: Not on file    Active member of club or organization: Not on file    Attends meetings of clubs or organizations: Not on file    Relationship status: Not on file  . Intimate partner violence:    Fear of current or ex partner: Not on file    Emotionally abused: Not on file    Physically abused: Not on file    Forced sexual activity: Not on file  Other Topics Concern  . Not on file  Social History Narrative  . Not on file     Allergies  Allergen Reactions  . Ampicillin   . Cymbalta [Duloxetine Hcl] Other (See Comments)    Constipation, bloating, nausea  . Fentanyl Citrate     REACTION: vomiting, fever  . Penicillins      Outpatient Medications Prior to Visit  Medication Sig Dispense Refill  . ALPRAZolam (XANAX) 0.5 MG tablet Take 1 tablet (0.5 mg total) by mouth 3 (three) times daily as needed. 90 tablet 0  . azithromycin (ZITHROMAX Z-PAK) 250 MG tablet 2 tab po day 1 then 1 tab po x 4 days 6 each 0  . benzonatate (TESSALON) 200 MG capsule Take 1 capsule (200 mg total) by mouth 3 (three) times daily as needed for cough. 30 capsule 0  . Fluticasone-Salmeterol (ADVAIR DISKUS) 250-50 MCG/DOSE AEPB Inhale 1 puff into the lungs 2 (two) times daily. 60 each 5  . HYDROcodone-homatropine (HYCODAN) 5-1.5 MG/5ML syrup Take 5 mLs by mouth at bedtime as needed for cough. 90 mL 0  . levocetirizine (XYZAL) 5 MG tablet Take 1 tablet (5 mg total) by mouth every evening. 30 tablet 0  . NEOMYCIN-POLYMYXIN-HYDROCORTISONE (CORTISPORIN) 1 % SOLN OTIC solution Place 3 drops into the left ear every 8 (eight) hours. 10 mL 0  . OGESTREL 0.5-50 MG-MCG tablet Take 1 tablet by mouth daily.    Marland Kitchen scopolamine  (TRANSDERM-SCOP) 1 MG/3DAYS Place 1 patch (1.5 mg total) onto the skin every 3 (three) days. 10 patch 0  . sertraline (ZOLOFT) 50 MG tablet Take 1 tablet (50 mg total) by mouth  daily. 30 tablet 3  . tiZANidine (ZANAFLEX) 4 MG tablet 1 po bid prn 60 tablet 1  . topiramate (TOPAMAX) 100 MG tablet TAKE 1 TABLET(100 MG) BY MOUTH DAILY 30 tablet 2  . traMADol (ULTRAM) 50 MG tablet TAKE 1 TO 2 TABLETS BY MOUTH EVERY 6 HOURS AS NEEDED FOR PAIN 30 tablet 0  . traZODone (DESYREL) 50 MG tablet 1-2 po qhs 60 tablet 1  . triamcinolone cream (KENALOG) 0.1 % Apply 1 application topically 2 (two) times daily. 45 g 3   No facility-administered medications prior to visit.     Review of Systems  Constitutional: Positive for malaise/fatigue. Negative for fever and weight loss.  HENT: Negative for congestion, ear pain, nosebleeds and sore throat.   Eyes: Negative for redness.  Respiratory: Positive for cough and shortness of breath. Negative for wheezing.   Cardiovascular: Negative for palpitations, leg swelling and PND.  Gastrointestinal: Negative for nausea and vomiting.  Genitourinary: Negative for dysuria.  Skin: Negative for rash.  Neurological: Negative for headaches.  Endo/Heme/Allergies: Does not bruise/bleed easily.  Psychiatric/Behavioral: Negative for depression. The patient is not nervous/anxious.       Objective:  Physical Exam   Vitals:   12/29/17 1500  BP: (!) 135/95  Pulse: 82  SpO2: 96%  Weight: 131 lb (59.4 kg)  Height: 5' 4"  (1.626 m)    Gen: well appearing, no acute distress HENT: NCAT, OP clear, neck supple without masses Eyes: PERRL, EOMi Lymph: no cervical lymphadenopathy PULM: CTA B CV: RRR, no mgr, no JVD GI: BS+, soft, nontender, no hsm Derm: no rash or skin breakdown MSK: normal bulk and tone Neuro: A&Ox4, CN II-XII intact, strength 5/5 in all 4 extremities Psyche: normal mood and affect   CBC    Component Value Date/Time   WBC 7.6 02/13/2017 0917   RBC  4.54 02/13/2017 0917   HGB 14.4 02/13/2017 0917   HCT 43.4 02/13/2017 0917   PLT 293.0 02/13/2017 0917   MCV 95.7 02/13/2017 0917   MCHC 33.0 02/13/2017 0917   RDW 13.5 02/13/2017 0917   LYMPHSABS 2.0 02/13/2017 0917   MONOABS 0.7 02/13/2017 0917   EOSABS 0.1 02/13/2017 0917   BASOSABS 0.1 02/13/2017 0917     Chest imaging: 10/2017 CXR images reviewed: normal appearing pulmonary parenchyma  PFT: 11/2017 PFT: Ratio 63%, FEV1 2.23 L (79% pred), FVC 3.72 (105% pred), TLC 5.89L (116% pre), RV 2.48 L (139%), DLCO 24.66 (101% pre)  Labs:  Path:  Echo:  Heart Catheterization:       Assessment & Plan:   No diagnosis found.  Discussion: Sheila [name] comes to my clinic today for evaluation of shortness of breath and some chest tightness and pressure which is been ongoing for 8 months.  Objectively she has a normal physical exam and though the radiologist mentioned hyperinflation I actually feel that the images from her chest x-ray this year were within normal limits.  She does have airflow obstruction seen on lung function testing with preserved lung volumes and a preserved diffusion capacity.  If she did not have the history of underlying autoimmune illness then I would typically consider asthma or a side effect from the topiramate as an explanation for her symptoms.  While these are still possible, her underlying autoimmune conditions predispose her to something like bronchiectasis or vanishing lung syndrome which can be seen in lupus rarely.  Essentially this is a form of advanced bronchiolitis.  Interestingly, she does have small airways obstruction seen on the lung  function test.  I think today were going to generate more questions and answers, I need to get a high-resolution CT scan of the chest to assess for bronchiolitis, bronchiectasis and evidence of underlying interstitial lung disease.  I would like to get an alpha-1 antitrypsin level to see if she has a predisposing genetic  condition for airflow obstruction.  I am going to have her continue take Advair in the meantime.  Plan: Shortness of breath: I think for now it safest to assume that you have asthma so continue taking Advair twice a day As we discussed today I am going to assess for other conditions like bronchiolitis, bronchiectasis, and alpha-1 antitrypsin deficiency with a high-resolution CT scan of your chest and blood testing.  We will plan on seeing you back in 1-2 weeks to go over the results of the CT scan and blood work.  40 minutes spent with patient, 60 minute visit    Current Outpatient Medications:  .  ALPRAZolam (XANAX) 0.5 MG tablet, Take 1 tablet (0.5 mg total) by mouth 3 (three) times daily as needed., Disp: 90 tablet, Rfl: 0 .  azithromycin (ZITHROMAX Z-PAK) 250 MG tablet, 2 tab po day 1 then 1 tab po x 4 days, Disp: 6 each, Rfl: 0 .  benzonatate (TESSALON) 200 MG capsule, Take 1 capsule (200 mg total) by mouth 3 (three) times daily as needed for cough., Disp: 30 capsule, Rfl: 0 .  Fluticasone-Salmeterol (ADVAIR DISKUS) 250-50 MCG/DOSE AEPB, Inhale 1 puff into the lungs 2 (two) times daily., Disp: 60 each, Rfl: 5 .  HYDROcodone-homatropine (HYCODAN) 5-1.5 MG/5ML syrup, Take 5 mLs by mouth at bedtime as needed for cough., Disp: 90 mL, Rfl: 0 .  levocetirizine (XYZAL) 5 MG tablet, Take 1 tablet (5 mg total) by mouth every evening., Disp: 30 tablet, Rfl: 0 .  NEOMYCIN-POLYMYXIN-HYDROCORTISONE (CORTISPORIN) 1 % SOLN OTIC solution, Place 3 drops into the left ear every 8 (eight) hours., Disp: 10 mL, Rfl: 0 .  OGESTREL 0.5-50 MG-MCG tablet, Take 1 tablet by mouth daily., Disp: , Rfl:  .  scopolamine (TRANSDERM-SCOP) 1 MG/3DAYS, Place 1 patch (1.5 mg total) onto the skin every 3 (three) days., Disp: 10 patch, Rfl: 0 .  sertraline (ZOLOFT) 50 MG tablet, Take 1 tablet (50 mg total) by mouth daily., Disp: 30 tablet, Rfl: 3 .  tiZANidine (ZANAFLEX) 4 MG tablet, 1 po bid prn, Disp: 60 tablet, Rfl: 1 .   topiramate (TOPAMAX) 100 MG tablet, TAKE 1 TABLET(100 MG) BY MOUTH DAILY, Disp: 30 tablet, Rfl: 2 .  traMADol (ULTRAM) 50 MG tablet, TAKE 1 TO 2 TABLETS BY MOUTH EVERY 6 HOURS AS NEEDED FOR PAIN, Disp: 30 tablet, Rfl: 0 .  traZODone (DESYREL) 50 MG tablet, 1-2 po qhs, Disp: 60 tablet, Rfl: 1 .  triamcinolone cream (KENALOG) 0.1 %, Apply 1 application topically 2 (two) times daily., Disp: 45 g, Rfl: 3

## 2017-12-30 LAB — ALPHA-1 ANTITRYPSIN PHENOTYPE: A-1 Antitrypsin: 127 mg/dL (ref 90–200)

## 2017-12-31 ENCOUNTER — Encounter: Payer: Self-pay | Admitting: Medical

## 2017-12-31 ENCOUNTER — Ambulatory Visit: Payer: Federal, State, Local not specified - PPO | Admitting: Medical

## 2017-12-31 ENCOUNTER — Telehealth: Payer: Self-pay | Admitting: Pulmonary Disease

## 2017-12-31 VITALS — BP 113/70 | HR 90 | Temp 98.4°F | Resp 16 | Ht 64.0 in | Wt 130.2 lb

## 2017-12-31 DIAGNOSIS — H1013 Acute atopic conjunctivitis, bilateral: Secondary | ICD-10-CM | POA: Diagnosis not present

## 2017-12-31 DIAGNOSIS — J301 Allergic rhinitis due to pollen: Secondary | ICD-10-CM | POA: Diagnosis not present

## 2017-12-31 DIAGNOSIS — H6122 Impacted cerumen, left ear: Secondary | ICD-10-CM

## 2017-12-31 MED ORDER — OLOPATADINE HCL 0.1 % OP SOLN: 1.0000 [drp] | Freq: Two times a day (BID) | OPHTHALMIC | 0 refills | Status: DC

## 2017-12-31 NOTE — Progress Notes (Signed)
Subjective:    Patient ID: Sheila [name], female    DOB: 07/23/1966, 52 y.o.   MRN: [id number]  HPI  Pt in for follow up for  Lt ear wax obstruction.   Pt states now has a lot less pain. Hearing better now. She states some improvement with placing debrox drops.  She states she can feel the wax move.  Pt itching eyes, sneezing and nasal congestion also clearing. Better but eyes still itch. She is on xyzal and flonase.  Pt states scopalamine patch is too expensive.    Review of Systems  Constitutional: Negative for chills, fatigue and fever.  HENT: Positive for postnasal drip and sneezing. Negative for congestion, ear pain, facial swelling and sinus pain.        Much better.  Eyes: Positive for itching.  Respiratory: Negative for cough, chest tightness, shortness of breath and wheezing.   Cardiovascular: Negative for chest pain and palpitations.  Gastrointestinal: Negative for abdominal pain, diarrhea, nausea and vomiting.  Musculoskeletal: Negative for arthralgias and back pain.  Skin: Negative for color change and rash.  Neurological: Negative for dizziness, seizures, syncope, weakness and headaches.  Hematological: Negative for adenopathy. Does not bruise/bleed easily.  Psychiatric/Behavioral: Negative for agitation, behavioral problems, confusion, sleep disturbance and suicidal ideas. The patient is not nervous/anxious.     Past Medical History:  Diagnosis Date  . Anal fissure   . Anxiety   . Chronic interstitial cystitis   . Crohn disease ([place])   . Fibromyalgia   . Migraines   . Pneumonia 2007  . SLE (systemic lupus erythematosus) ([facility])      Social History   Socioeconomic History  . Marital status: Married    Spouse name: Not on file  . Number of children: 2  . Years of education: Not on file  . Highest education level: Not on file  Occupational History  . Occupation: Print production planner  Social Needs  . Financial resource strain: Not on file  . Food  insecurity:    Worry: Not on file    Inability: Not on file  . Transportation needs:    Medical: Not on file    Non-medical: Not on file  Tobacco Use  . Smoking status: Never Smoker  . Smokeless tobacco: Never Used  Substance and Sexual Activity  . Alcohol use: No    Alcohol/week: 0.0 oz  . Drug use: No  . Sexual activity: Not on file  Lifestyle  . Physical activity:    Days per week: Not on file    Minutes per session: Not on file  . Stress: Not on file  Relationships  . Social connections:    Talks on phone: Not on file    Gets together: Not on file    Attends religious service: Not on file    Active member of club or organization: Not on file    Attends meetings of clubs or organizations: Not on file    Relationship status: Not on file  . Intimate partner violence:    Fear of current or ex partner: Not on file    Emotionally abused: Not on file    Physically abused: Not on file    Forced sexual activity: Not on file  Other Topics Concern  . Not on file  Social History Narrative  . Not on file    Past Surgical History:  Procedure Laterality Date  . BLADDER SURGERY    . CESAREAN SECTION    . KNEE ARTHROSCOPY    .  OOPHORECTOMY     right  . TONSILLECTOMY    . WRIST SURGERY      Family History  Problem Relation Age of Onset  . Breast cancer Mother   . Lung cancer Father        Died in Nov 16, 2011  . Breast cancer Maternal Grandmother   . Breast cancer Maternal Aunt   . Prostate cancer Maternal Grandfather   . Irritable bowel syndrome Sister   . Colon cancer Neg Hx   . Colon polyps Neg Hx   . Esophageal cancer Neg Hx   . Kidney disease Neg Hx   . Gallbladder disease Neg Hx     Allergies  Allergen Reactions  . Ampicillin   . Cymbalta [Duloxetine Hcl] Other (See Comments)    Constipation, bloating, nausea  . Fentanyl Citrate     REACTION: vomiting, fever  . Penicillins     Current Outpatient Medications on File Prior to Visit  Medication Sig Dispense  Refill  . ALPRAZolam (XANAX) 0.5 MG tablet Take 1 tablet (0.5 mg total) by mouth 3 (three) times daily as needed. 90 tablet 0  . benzonatate (TESSALON) 200 MG capsule Take 1 capsule (200 mg total) by mouth 3 (three) times daily as needed for cough. 30 capsule 0  . Fluticasone-Salmeterol (ADVAIR DISKUS) 250-50 MCG/DOSE AEPB Inhale 1 puff into the lungs 2 (two) times daily. 60 each 5  . HYDROcodone-homatropine (HYCODAN) 5-1.5 MG/5ML syrup Take 5 mLs by mouth at bedtime as needed for cough. 90 mL 0  . levocetirizine (XYZAL) 5 MG tablet Take 1 tablet (5 mg total) by mouth every evening. 30 tablet 0  . NEOMYCIN-POLYMYXIN-HYDROCORTISONE (CORTISPORIN) 1 % SOLN OTIC solution Place 3 drops into the left ear every 8 (eight) hours. 10 mL 0  . OGESTREL 0.5-50 MG-MCG tablet Take 1 tablet by mouth daily.    Marland Kitchen scopolamine (TRANSDERM-SCOP) 1 MG/3DAYS Place 1 patch (1.5 mg total) onto the skin every 3 (three) days. 10 patch 0  . sertraline (ZOLOFT) 50 MG tablet Take 1 tablet (50 mg total) by mouth daily. 30 tablet 3  . tiZANidine (ZANAFLEX) 4 MG tablet 1 po bid prn 60 tablet 1  . topiramate (TOPAMAX) 100 MG tablet TAKE 1 TABLET(100 MG) BY MOUTH DAILY 30 tablet 2  . traMADol (ULTRAM) 50 MG tablet TAKE 1 TO 2 TABLETS BY MOUTH EVERY 6 HOURS AS NEEDED FOR PAIN 30 tablet 0  . traZODone (DESYREL) 50 MG tablet 1-2 po qhs 60 tablet 1  . triamcinolone cream (KENALOG) 0.1 % Apply 1 application topically 2 (two) times daily. 45 g 3   No current facility-administered medications on file prior to visit.     BP 113/70   Pulse 90   Temp 98.4 F (36.9 C) (Oral)   Resp 16   Ht 5' 4"  (1.626 m)   Wt 130 lb 3.2 oz (59.1 kg)   SpO2 99%   BMI 22.35 kg/m       Objective:   Physical Exam  General  Mental Status - Alert. General Appearance - Well groomed. Not in acute distress.  Skin Rashes- No Rashes.  HEENT Head- Normal. Ear Auditory Canal - Left- moderate wax obstruction pre-lavage.  Post lavage wax cleared  right - Normal.Tympanic Membrane- Left- Normal. Right- Normal. Eye Sclera/Conjunctiva- Left- Normal. Right- Normal. Nose & Sinuses Nasal Mucosa- Left-  Boggy and Congested. Right-  Boggy and  Congested.Bilateral maxillary and frontal sinus pressure. Mouth & Throat Lips: Upper Lip- Normal: no dryness, cracking, pallor,  cyanosis, or vesicular eruption. Lower Lip-Normal: no dryness, cracking, pallor, cyanosis or vesicular eruption. Buccal Mucosa- Bilateral- No Aphthous ulcers. Oropharynx- No Discharge or Erythema. Tonsils: Characteristics- Bilateral- No Erythema or Congestion. Size/Enlargement- Bilateral- No enlargement. Discharge- bilateral-None.  Neck Neck- Supple. No Masses.   Chest and Lung Exam Auscultation: Breath Sounds:-Clear even and unlabored.  Cardiovascular Auscultation:Rythm- Regular, rate and rhythm. Murmurs & Other Heart Sounds:Ausculatation of the heart reveal- No Murmurs.  Lymphatic Head & Neck General Head & Neck Lymphatics: Bilateral: Description- No Localized lymphadenopathy.       Assessment & Plan:  Your allergic rhinitis symptoms have improved.  Continue Xyzal and nasal spray.  Some improvement of allergic conjunctivitis symptoms but still some irritating symptoms.  So I did prescribe Patanol.  For cerumen impaction we did lavage left ear canal today.  Can continue the Cortisporin otic drops for the next 2-3 days.  For motion sickness prevention, I do think that scopolamine patch will likely be more effective than medication such as Dramamine which is over-the-counter..  Follow-up as regular schedule with PCP or as needed.  Mackie Pai, PA-C

## 2017-12-31 NOTE — Patient Instructions (Signed)
Your allergic rhinitis symptoms have improved.  Continue Xyzal and nasal spray.  Some improvement of allergic conjunctivitis symptoms but still some irritating symptoms.  So I did prescribe Patanol.  For cerumen impaction we did lavage left ear canal today.  Can continue the Cortisporin otic drops for the next 2-3 days.  For motion sickness prevention, I do think that scopolamine patch will likely be more effective than medication such as Dramamine which is over-the-counter..  Follow-up as regular schedule with PCP or as needed.

## 2017-12-31 NOTE — Progress Notes (Signed)
LMTCB

## 2017-12-31 NOTE — Telephone Encounter (Signed)
Notes recorded by Juanito Doom, MD on 12/31/2017 at 8:16 AM EDT A, Please let the patient know that her genetic test for airway disease was abnormal, but there is nothing different to do today. The explanation is fairly complex but their is nothing severe or overly concerning to review. Thanks, B  Advised pt of results. She is was very upset about the abnormal test but she states she will have the CT done. She seemed confused but I read your message to her. She doesn't have an appt to discuss until 01/18/18. She didn't know if there was something that can be done. I told her that she would have to discuss with you preferably in an office visit but I didn't know if you would call her to explain and answer her questions. BQ please advise.

## 2018-01-01 NOTE — Telephone Encounter (Signed)
I called her and explained it to her

## 2018-01-01 NOTE — Telephone Encounter (Signed)
Will close encounter

## 2018-01-12 DIAGNOSIS — Z6822 Body mass index (BMI) 22.0-22.9, adult: Secondary | ICD-10-CM | POA: Diagnosis not present

## 2018-01-12 DIAGNOSIS — Z01419 Encounter for gynecological examination (general) (routine) without abnormal findings: Secondary | ICD-10-CM | POA: Diagnosis not present

## 2018-01-12 DIAGNOSIS — Z1231 Encounter for screening mammogram for malignant neoplasm of breast: Secondary | ICD-10-CM | POA: Diagnosis not present

## 2018-01-13 ENCOUNTER — Other Ambulatory Visit: Payer: Self-pay | Admitting: Family Medicine

## 2018-01-13 DIAGNOSIS — G43009 Migraine without aura, not intractable, without status migrainosus: Secondary | ICD-10-CM

## 2018-01-14 ENCOUNTER — Ambulatory Visit (HOSPITAL_BASED_OUTPATIENT_CLINIC_OR_DEPARTMENT_OTHER)
Admission: RE | Admit: 2018-01-14 | Discharge: 2018-01-14 | Disposition: A | Discharge status: Home / Self Care | Payer: Federal, State, Local not specified - PPO | Source: Ambulatory Visit | Attending: Pulmonary Disease | Admitting: Pulmonary Disease

## 2018-01-14 DIAGNOSIS — I2510 Atherosclerotic heart disease of native coronary artery without angina pectoris: Secondary | ICD-10-CM | POA: Insufficient documentation

## 2018-01-14 DIAGNOSIS — R0600 Dyspnea, unspecified: Secondary | ICD-10-CM

## 2018-01-14 DIAGNOSIS — R911 Solitary pulmonary nodule: Secondary | ICD-10-CM | POA: Insufficient documentation

## 2018-01-15 ENCOUNTER — Telehealth: Payer: Self-pay | Admitting: Pulmonary Disease

## 2018-01-15 ENCOUNTER — Telehealth: Payer: Self-pay | Admitting: Gastroenterology

## 2018-01-15 NOTE — Telephone Encounter (Signed)
Called and spoke with pt letting her know the results of her labwork and CT scan.  Pt expressed understanding. Nothing further needed at this time.

## 2018-01-15 NOTE — Telephone Encounter (Signed)
Dr Ardis Hughs please review.  I do not see any mention of a recall

## 2018-01-16 NOTE — Telephone Encounter (Signed)
Message left for Sheila [name] that a recall in July or August has been added to the system.  The pt will get a letter when it is time to set up.

## 2018-01-16 NOTE — Telephone Encounter (Signed)
She is turning 52 in July. Should have colonoscopy this July or August for screening.   Thanks

## 2018-01-20 ENCOUNTER — Other Ambulatory Visit: Payer: Self-pay | Admitting: Family Medicine

## 2018-01-20 ENCOUNTER — Other Ambulatory Visit: Payer: Self-pay | Admitting: Medical

## 2018-01-20 DIAGNOSIS — F418 Other specified anxiety disorders: Secondary | ICD-10-CM

## 2018-01-20 DIAGNOSIS — F419 Anxiety disorder, unspecified: Secondary | ICD-10-CM

## 2018-01-21 NOTE — Telephone Encounter (Signed)
Database ran and is on your desk for review.  Last filled per database: 12/05/17 Last written: 11/18/17 Last ov: 11/18/17 Next ov: none Contract: 10/01/18 UDS: 03/31/18

## 2018-01-22 ENCOUNTER — Encounter: Payer: Self-pay | Admitting: Pulmonary Disease

## 2018-01-22 ENCOUNTER — Ambulatory Visit (INDEPENDENT_AMBULATORY_CARE_PROVIDER_SITE_OTHER): Payer: Federal, State, Local not specified - PPO | Admitting: Pulmonary Disease

## 2018-01-22 VITALS — BP 124/77 | HR 80 | Ht 64.0 in | Wt 130.2 lb

## 2018-01-22 DIAGNOSIS — E8801 Alpha-1-antitrypsin deficiency: Secondary | ICD-10-CM

## 2018-01-22 DIAGNOSIS — M329 Systemic lupus erythematosus, unspecified: Secondary | ICD-10-CM | POA: Diagnosis not present

## 2018-01-22 DIAGNOSIS — R0600 Dyspnea, unspecified: Secondary | ICD-10-CM | POA: Diagnosis not present

## 2018-01-22 DIAGNOSIS — R05 Cough: Secondary | ICD-10-CM | POA: Diagnosis not present

## 2018-01-22 DIAGNOSIS — R059 Cough, unspecified: Secondary | ICD-10-CM

## 2018-01-22 DIAGNOSIS — Z148 Genetic carrier of other disease: Secondary | ICD-10-CM

## 2018-01-22 NOTE — Patient Instructions (Signed)
Pulmonary nodule: We will arrange for another CT scan in one year on the next visit  For the alpha-1-antitrypsin carrier state: We will refer you to the [facility] alpha-1-antitrypsin clinic  For the cough, "raw sensation in your throat" You need to try to suppress your cough to allow your larynx (voice box) to heal.  For three days don't talk, laugh, sing, or clear your throat. Do everything you can to suppress the cough during this time. Use hard candies (sugarless Jolly Ranchers) or non-mint or non-menthol containing cough drops during this time to soothe your throat.  Use a cough suppressant (Delsym or what I have prescribed you) around the clock during this time.  After three days, gradually increase the use of your voice and back off on the cough suppressants. If this hasn't improved by the summer then we can arrange a visit with a speech therapist to help  Asthma: Continue advair for now  We will see you back in June

## 2018-01-22 NOTE — Progress Notes (Signed)
Subjective:   PATIENT ID: Sheila [name] GENDER: female DOB: 1965-12-12, MRN: [id number]  Synopsis: Referred in April 2019 for Cough, dyspnea.  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  Has moderate airflow obstruction and Lupus.   HPI  Chief Complaint  Patient presents with  . Follow-up    follow up after CT scan and lab work. Patient states that she feels the same since last visit.    Sheila [name] says she feels about the same.  She continues to struggle with shortness of breath and feels that her breathing is "not right".  She has the sensation that she needs to cough when she takes a deep breath and she says that she still has some coughing spells from time to time.  She has started to exercise again and she says that she is now walking about 1-1/2 miles 3 times a week with her dogs and it feels pretty good.  However, she still having a hard time sleeping and she says she wonders if this is related to her inability to breathe well.   Past Medical History:  Diagnosis Date  . Anal fissure   . Anxiety   . Chronic interstitial cystitis   . Crohn disease ([place])   . Fibromyalgia   . Migraines   . Pneumonia 2007  . SLE (systemic lupus erythematosus) ([facility])       Review of Systems  Constitutional: Positive for malaise/fatigue. Negative for fever and weight loss.  HENT: Negative for congestion, ear pain, nosebleeds and sore throat.   Eyes: Negative for redness.  Respiratory: Positive for cough and shortness of breath. Negative for wheezing.   Cardiovascular: Negative for palpitations, leg swelling and PND.  Gastrointestinal: Negative for nausea and vomiting.  Genitourinary: Negative for dysuria.  Skin: Negative for rash.  Neurological: Negative for headaches.  Endo/Heme/Allergies: Does not bruise/bleed easily.  Psychiatric/Behavioral: Negative for depression. The patient is not nervous/anxious.       Objective:  Physical Exam   Vitals:     01/22/18 1624  BP: 124/77  Pulse: 80  SpO2: 97%  Weight: 130 lb 3.2 oz (59.1 kg)  Height: 5' 4"  (1.626 m)    Gen: well appearing HENT: OP clear, TM's clear, neck supple PULM: CTA B, normal percussion CV: RRR, no mgr, trace edema GI: BS+, soft, nontender Derm: no cyanosis or rash Psyche: normal mood and affect    CBC    Component Value Date/Time   WBC 7.6 02/13/2017 0917   RBC 4.54 02/13/2017 0917   HGB 14.4 02/13/2017 0917   HCT 43.4 02/13/2017 0917   PLT 293.0 02/13/2017 0917   MCV 95.7 02/13/2017 0917   MCHC 33.0 02/13/2017 0917   RDW 13.5 02/13/2017 0917   LYMPHSABS 2.0 02/13/2017 0917   MONOABS 0.7 02/13/2017 0917   EOSABS 0.1 02/13/2017 0917   BASOSABS 0.1 02/13/2017 0917     Chest imaging: 10/2017 CXR images reviewed: normal appearing pulmonary parenchyma May 2019 high-resolution CT scan of the chest images independently reviewed showing normal pulmonary parenchyma, no evidence of interstitial lung disease, 3 mm nodule noted   PFT: 11/2017 PFT: Ratio 63%, FEV1 2.23 L (79% pred), FVC 3.72 (105% pred), TLC 5.89L (116% pre), RV 2.48 L (139%), DLCO 24.66 (101% pre)  Labs:  Path:  Echo:  Heart Catheterization:       Assessment & Plan:   No diagnosis found.  Discussion: Her cough and sensation of rawness in her chest  and throat is likely related to laryngeal irritation.  In part she has a vocal cord dysfunction/vocal cord irritability type syndrome.  In fact I think this causes the majority of her symptoms right now.  This is clearly magnified by her anxiety and overall stress level.  The fact that her symptoms nearly resolved when she was on a cruise over spring break recently speaks to this.  I believe that influenza started this process and it will eventually end when she is able to rest her voice effectively for several weeks on and (likely this summer after school is out).  We have incidentally discovered that she carries an abnormal allele for  alpha-1 antitrypsin yet has a normal level.  She also has mild to moderate airflow obstruction.  The CT scan of her chest was normal with the exception of a small pulmonary nodule.  In general I think these have very little to do with the symptoms she has been experiencing because all of them are mild.  I think it may be worthwhile for her to see someone in the alpha-1 antitrypsin clinic at [facility] for a second opinion because she does have symptoms and airflow obstruction to ensure there is no indication for replacement therapy.  I do not think replacement would be a good idea but it is probably worth at least a second opinion.  Plan:  Pulmonary nodule: We will arrange for another CT scan in one year on the next visit  For the alpha-1-antitrypsin carrier state: We will refer you to the [facility] alpha-1-antitrypsin clinic  For the cough, "raw sensation in your throat" You need to try to suppress your cough to allow your larynx (voice box) to heal.  For three days don't talk, laugh, sing, or clear your throat. Do everything you can to suppress the cough during this time. Use hard candies (sugarless Jolly Ranchers) or non-mint or non-menthol containing cough drops during this time to soothe your throat.  Use a cough suppressant (Delsym or what I have prescribed you) around the clock during this time.  After three days, gradually increase the use of your voice and back off on the cough suppressants. If this hasn't improved by the summer then we can arrange a visit with a speech therapist to help  Asthma: Continue advair for now  We will see you back in June  30 minutes spent with patient, 40 minute visit   Current Outpatient Medications:  .  ALPRAZolam (XANAX) 0.5 MG tablet, TAKE 1 TABLET(0.5 MG) BY MOUTH THREE TIMES DAILY AS NEEDED, Disp: 90 tablet, Rfl: 0 .  benzonatate (TESSALON) 200 MG capsule, Take 1 capsule (200 mg total) by mouth 3 (three) times daily as needed for cough., Disp: 30 capsule, Rfl:  0 .  Fluticasone-Salmeterol (ADVAIR DISKUS) 250-50 MCG/DOSE AEPB, Inhale 1 puff into the lungs 2 (two) times daily., Disp: 60 each, Rfl: 5 .  levocetirizine (XYZAL) 5 MG tablet, Take 1 tablet (5 mg total) by mouth every evening., Disp: 30 tablet, Rfl: 0 .  OGESTREL 0.5-50 MG-MCG tablet, Take 1 tablet by mouth daily., Disp: , Rfl:  .  olopatadine (PATANOL) 0.1 % ophthalmic solution, Place 1 drop into both eyes 2 (two) times daily., Disp: 5 mL, Rfl: 0 .  scopolamine (TRANSDERM-SCOP) 1 MG/3DAYS, Place 1 patch (1.5 mg total) onto the skin every 3 (three) days., Disp: 10 patch, Rfl: 0 .  sertraline (ZOLOFT) 50 MG tablet, Take 1 tablet (50 mg total) by mouth daily., Disp: 30 tablet, Rfl: 3 .  tiZANidine (ZANAFLEX) 4 MG tablet, 1 po bid prn, Disp: 60 tablet, Rfl: 1 .  topiramate (TOPAMAX) 100 MG tablet, TAKE 1 TABLET(100 MG) BY MOUTH DAILY, Disp: 90 tablet, Rfl: 0 .  traMADol (ULTRAM) 50 MG tablet, TAKE 1 TO 2 TABLETS BY MOUTH EVERY 6 HOURS AS NEEDED FOR PAIN, Disp: 30 tablet, Rfl: 0 .  traZODone (DESYREL) 50 MG tablet, 1-2 po qhs, Disp: 60 tablet, Rfl: 1 .  triamcinolone cream (KENALOG) 0.1 %, Apply 1 application topically 2 (two) times daily., Disp: 45 g, Rfl: 3

## 2018-02-02 ENCOUNTER — Other Ambulatory Visit: Payer: Self-pay | Admitting: Family Medicine

## 2018-02-02 DIAGNOSIS — M797 Fibromyalgia: Secondary | ICD-10-CM

## 2018-02-04 NOTE — Telephone Encounter (Signed)
Pt is at the pharmacy checking on refill. She has been out of the medication several days. The pharmacy told her they faxed over the request twice. Could another provider sign off on this?

## 2018-02-04 NOTE — Telephone Encounter (Signed)
Last refill on 10/30/2017  #30 no refills Last office visit on 11/18/2017 [facility] signed on 10/01/2017 [facility]--next is due on 03/31/2018

## 2018-02-04 NOTE — Telephone Encounter (Signed)
Patient informed prescription sent in/also was sent a mychart message as well.

## 2018-02-05 DIAGNOSIS — K582 Mixed irritable bowel syndrome: Secondary | ICD-10-CM | POA: Diagnosis not present

## 2018-02-05 DIAGNOSIS — M797 Fibromyalgia: Secondary | ICD-10-CM | POA: Diagnosis not present

## 2018-02-05 DIAGNOSIS — N9489 Other specified conditions associated with female genital organs and menstrual cycle: Secondary | ICD-10-CM | POA: Diagnosis not present

## 2018-02-05 DIAGNOSIS — N3010 Interstitial cystitis (chronic) without hematuria: Secondary | ICD-10-CM | POA: Diagnosis not present

## 2018-02-12 ENCOUNTER — Encounter: Payer: Self-pay | Admitting: Pulmonary Disease

## 2018-02-12 DIAGNOSIS — R05 Cough: Secondary | ICD-10-CM

## 2018-02-12 DIAGNOSIS — R059 Cough, unspecified: Secondary | ICD-10-CM

## 2018-02-13 ENCOUNTER — Encounter: Payer: Self-pay | Admitting: Gastroenterology

## 2018-02-13 NOTE — Telephone Encounter (Signed)
Received email from patient.  BQ please advise. Thank you!  ----- Message -----    From: Werner Lean    Sent: 02/12/2018  9:32 PM EDT      To: Simonne Maffucci, MD Subject: Visit Follow-Up Question  Good Evening, Dr. Lake Bells So, [facility] called and I have an appt with Dr. Casper Harrison on June 18th. I will look to schedule my follow up with you after I see him. I know you asked to have a follow up appt with you in June. I'm struggling.... my cough isn't worse, but it's painful. My chest hurts and my throat, voice box, vocal chords, as we had discussed are perhaps indeed distressed, infected; I don't know what. But it hurts to talk, hurts to breathe, and I still get winded. I wish I could go on that vacation you suggested and not talk to anyone for 5 days straight. However, in the meantime, what do you think about seeing/referring me to an ENT specialist about this? I've never been to one before, but I feel more inclined that they may shed some light on this after speaking to you and listening to you after our last visit than seeing a speech pathologist. 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 I'm afraid I'm not getting better; I'm only feeling worse.

## 2018-02-13 NOTE — Telephone Encounter (Signed)
Reminder letter sent to patient.

## 2018-02-16 NOTE — Telephone Encounter (Signed)
Wrote order per BQ to ENT, Dr. Ernestine Conrad. Let patient know that referral was placed. Nothing further is needed at this time.

## 2018-02-24 DIAGNOSIS — M6281 Muscle weakness (generalized): Secondary | ICD-10-CM | POA: Diagnosis not present

## 2018-02-24 DIAGNOSIS — R3915 Urgency of urination: Secondary | ICD-10-CM | POA: Diagnosis not present

## 2018-02-24 DIAGNOSIS — R102 Pelvic and perineal pain: Secondary | ICD-10-CM | POA: Diagnosis not present

## 2018-02-24 DIAGNOSIS — M62838 Other muscle spasm: Secondary | ICD-10-CM | POA: Diagnosis not present

## 2018-02-27 ENCOUNTER — Telehealth: Payer: Self-pay | Admitting: Pulmonary Disease

## 2018-02-27 ENCOUNTER — Encounter: Payer: Self-pay | Admitting: Pulmonary Disease

## 2018-02-27 ENCOUNTER — Ambulatory Visit (INDEPENDENT_AMBULATORY_CARE_PROVIDER_SITE_OTHER): Payer: Federal, State, Local not specified - PPO | Admitting: Pulmonary Disease

## 2018-02-27 VITALS — BP 118/70 | HR 72 | Temp 98.4°F | Ht 64.0 in | Wt 132.8 lb

## 2018-02-27 DIAGNOSIS — J040 Acute laryngitis: Secondary | ICD-10-CM

## 2018-02-27 DIAGNOSIS — K219 Gastro-esophageal reflux disease without esophagitis: Secondary | ICD-10-CM

## 2018-02-27 DIAGNOSIS — R490 Dysphonia: Secondary | ICD-10-CM

## 2018-02-27 MED ORDER — FAMOTIDINE 20 MG PO TABS: 20.0000 mg | ORAL_TABLET | Freq: Two times a day (BID) | ORAL | 0 refills | Status: DC

## 2018-02-27 MED ORDER — BENZONATATE 200 MG PO CAPS: 200.0000 mg | ORAL_CAPSULE | Freq: Three times a day (TID) | ORAL | 0 refills | Status: DC | PRN

## 2018-02-27 MED ORDER — OMEPRAZOLE 20 MG PO CPDR: 20.0000 mg | DELAYED_RELEASE_CAPSULE | Freq: Every day | ORAL | 4 refills | Status: DC

## 2018-02-27 NOTE — Progress Notes (Signed)
@Patient  ID: Sheila [name], female    DOB: December 04, 1965, 52 y.o.   MRN: [id number]  Chief Complaint  Patient presents with  . Acute Visit    FLU in february. Hoarse, pain in throat, difficulty swallowing, states she feels like her wind pipe is spasming and she wakes up in the middle night gasping for air. Difficult to swallow saliva. Pain radiates into chest. Wheezing at night. States pain has been there but the hoarseness is new within the last week. It was recommened to have vocal rest but patient is a Pharmacist, hospital. Intermittent cough with mucous and blood. Pain in both ears with increased weakness and increased SOB.     Referring provider: Ann Held, *  HPI: Synopsis: Referred in April 2019 for Cough, dyspnea.  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  Has moderate airflow obstruction and Lupus.   Recent [place] [facility] Encounters:  01/22/2018-office visit-[facility]  Pulmonary nodule: We will arrange for another CT scan in one year on the next visit  For the alpha-1-antitrypsin carrier state: We will refer you to the [facility] alpha-1-antitrypsin clinic  For the cough, "raw sensation in your throat" You need to try to suppress your cough to allow your larynx (voice box) to heal.  For three days don't talk, laugh, sing, or clear your throat. Do everything you can to suppress the cough during this time. Use hard candies (sugarless Jolly Ranchers) or non-mint or non-menthol containing cough drops during this time to soothe your throat.  Use a cough suppressant (Delsym or what I have prescribed you) around the clock during this time.  After three days, gradually increase the use of your voice and back off on the cough suppressants. If this hasn't improved by the summer then we can arrange a visit with a speech therapist to help  Asthma: Continue advair for now  We will see you back in June     Tests:  Imaging:   Cardiac:   Labs:    Micro:   Chart Review:    02/27/18 Acute   Patient presenting to office today with continued symptoms from May/2019 appointment.  Unfortunately due to patient's job patient has been unable to do the throat rest/voice rest that Dr. Lake Bells had requested.  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  Patient plans on starting today after appointment.  Patient presented to office today after triage nurse said that they needed to be seen.  Patient also reporting some dry cough exacerbated when she is laying flat.  Also when she is sleeping at night.  Patient is unable to put a pattern on other symptoms.  Patient has been using Best boy.  Patient unable to use Delsym as she says it gets her to "ramped up"   Allergies  Allergen Reactions  . Ampicillin   . Cymbalta [Duloxetine Hcl] Other (See Comments)    Constipation, bloating, nausea  . Fentanyl Citrate     REACTION: vomiting, fever  . Penicillins     Immunization History  Administered Date(s) Administered  . Influenza Whole 08/04/2007, 08/29/2011, 07/29/2012  . Influenza,inj,Quad PF,6+ Mos 07/11/2015  . Td 06/21/2010    Past Medical History:  Diagnosis Date  . Anal fissure   . Anxiety   . Chronic interstitial cystitis   . Crohn disease ([place])   . Fibromyalgia   . Migraines   . Pneumonia 2007  . SLE (systemic lupus erythematosus) ([place])  Tobacco History: Social History   Tobacco Use  Smoking Status Never Smoker  Smokeless Tobacco Never Used   Counseling given: Not Answered   Outpatient Encounter Medications as of 02/27/2018  Medication Sig  . ALPRAZolam (XANAX) 0.5 MG tablet TAKE 1 TABLET(0.5 MG) BY MOUTH THREE TIMES DAILY AS NEEDED  . benzonatate (TESSALON) 200 MG capsule Take 1 capsule (200 mg total) by mouth 3 (three) times daily as needed for cough.  . Fluticasone-Salmeterol (ADVAIR DISKUS) 250-50 MCG/DOSE AEPB Inhale 1 puff into the lungs 2 (two) times daily.  Marland Kitchen levocetirizine (XYZAL) 5  MG tablet TAKE 1 TABLET(5 MG) BY MOUTH EVERY EVENING  . OGESTREL 0.5-50 MG-MCG tablet Take 1 tablet by mouth daily.  Marland Kitchen olopatadine (PATANOL) 0.1 % ophthalmic solution Place 1 drop into both eyes 2 (two) times daily.  . sertraline (ZOLOFT) 50 MG tablet Take 1 tablet (50 mg total) by mouth daily.  Marland Kitchen tiZANidine (ZANAFLEX) 4 MG tablet 1 po bid prn  . topiramate (TOPAMAX) 100 MG tablet TAKE 1 TABLET(100 MG) BY MOUTH DAILY  . traMADol (ULTRAM) 50 MG tablet TAKE 1 TO 2 TABLETS BY MOUTH EVERY 6 HOURS AS NEEDED FOR PAIN  . traZODone (DESYREL) 50 MG tablet 1-2 po qhs  . triamcinolone cream (KENALOG) 0.1 % Apply 1 application topically 2 (two) times daily.  . [DISCONTINUED] benzonatate (TESSALON) 200 MG capsule Take 1 capsule (200 mg total) by mouth 3 (three) times daily as needed for cough.  . famotidine (PEPCID) 20 MG tablet Take 1 tablet (20 mg total) by mouth 2 (two) times daily.  Marland Kitchen omeprazole (PRILOSEC) 20 MG capsule Take 1 capsule (20 mg total) by mouth daily.  . [DISCONTINUED] scopolamine (TRANSDERM-SCOP) 1 MG/3DAYS Place 1 patch (1.5 mg total) onto the skin every 3 (three) days. (Patient not taking: Reported on 02/27/2018)   No facility-administered encounter medications on file as of 02/27/2018.      Review of Systems  Constitutional: +fatigue  No  weight loss, night sweats,  fevers, chills, fatigue, or  lassitude HEENT:  +throat sensation, cough, sensation, ear full ness, difficulty swallowing, ST    No headaches,   Tooth/dental problems CV: No chest pain,  orthopnea, PND, swelling in lower extremities, anasarca, dizziness, palpitations, syncope  GI: +loss of appetite No heartburn, indigestion, abdominal pain, nausea, vomiting, diarrhea, change in bowel habits, loss of appetite, bloody stools Resp: +dry cough No shortness of breath with exertion or at rest.  No excess mucus, no productive cough,  No coughing up of blood.  No change in color of mucus.  No wheezing.  No chest wall deformity Skin:  no rash, lesions, no skin changes. GU: no dysuria, change in color of urine, no urgency or frequency.  No flank pain, no hematuria  MS:  No joint pain or swelling.  No decreased range of motion.  No back pain. Psych:  No change in mood or affect. No depression or anxiety.  No memory loss.   Physical Exam  BP 118/70   Pulse 72   Temp 98.4 F (36.9 C) (Oral)   Ht 5' 4"  (1.626 m)   Wt 132 lb 12.8 oz (60.2 kg)   SpO2 97%   BMI 22.80 kg/m   Wt Readings from Last 3 Encounters:  02/27/18 132 lb 12.8 oz (60.2 kg)  01/22/18 130 lb 3.2 oz (59.1 kg)  12/31/17 130 lb 3.2 oz (59.1 kg)     GEN: A/Ox3; pleasant , NAD, well nourished, anxious   HEENT:  [place]/AT,  EACs- right canal impacted with cerumen, left canal clear, TMs-with effusion but without infection, NOSE-clear, THROAT- +post nasal drip   NECK:  Supple w/ fair ROM; no JVD; normal carotid impulses w/o bruits; no thyromegaly or nodules palpated; no lymphadenopathy.    RESP:  Clear  P & A; w/o, wheezes/ rales/ or rhonchi. no accessory muscle use, no dullness to percussion  CARD:  RRR, no m/r/g, no peripheral edema, pulses intact, no cyanosis or clubbing.  GI:   Soft & nt; + hyperactive bowel sounds; no organomegaly or masses detected.   Musco: Warm bil, no deformities or joint swelling noted.   Neuro: alert, no focal deficits noted.    Skin: Warm, no lesions or rashes    Lab Results:  CBC    Component Value Date/Time   WBC 7.6 02/13/2017 0917   RBC 4.54 02/13/2017 0917   HGB 14.4 02/13/2017 0917   HCT 43.4 02/13/2017 0917   PLT 293.0 02/13/2017 0917   MCV 95.7 02/13/2017 0917   MCHC 33.0 02/13/2017 0917   RDW 13.5 02/13/2017 0917   LYMPHSABS 2.0 02/13/2017 0917   MONOABS 0.7 02/13/2017 0917   EOSABS 0.1 02/13/2017 0917   BASOSABS 0.1 02/13/2017 0917    BMET    Component Value Date/Time   NA 139 02/13/2017 0917   K 4.0 02/13/2017 0917   CL 108 02/13/2017 0917   CO2 26 02/13/2017 0917   GLUCOSE 88 02/13/2017  0917   BUN 14 02/13/2017 0917   CREATININE 0.95 02/13/2017 0917   CALCIUM 9.2 02/13/2017 0917   GFRNONAA 99.19 03/05/2010 1038   GFRAA 119 08/05/2008 1003    BNP No results found for: BNP  ProBNP No results found for: PROBNP  Imaging: No results found.   Assessment & Plan:   Discussion: Pleasant 52 year old patient.  Unfortunately due to her job patient has been unable to do the voice rest that was instructed for her to do back in May.  Patient is now out on summer break.  Patient is willing to do with the voice rest starting today.  Her symptoms have persisted since her May/19 appointment.  Still having a chronic hoarseness next to her throat as well as a dry cough.    I suspect she potentially may be having some reflux that could also be exacerbating the hoarseness.  I will start her on aggressive GERD regimen today and see how she does over the next 6 to 8 weeks.  I still would like her to follow-up with Dr. Casper Harrison as previously instructed by Dr. Lake Bells.  Continue over-the-counter cough medicine such as Delsym, will also refill Tessalon Perles at this time.  Unfortunately any of the numbing medications such as Chloraseptic spray or Chloraseptic cough drops also have the potential to irritate exacerbate your cough.  Hoarseness, persistent Will start Prilosec (omeprazole) 20 mg tablet daily in the morning >>> Take on empty stomach >>> Wait 30 minutes to an hour before taking other medications and eating  Will start Pepcid 20 mg tablet daily at night  Can do Chloraseptic Spray to help numb the back your throat   Remember to do voice rest: You need to try to suppress your cough to allow your larynx (voice box) to heal.  For three days don't talk, laugh, sing, or clear your throat. Do everything you can to suppress the cough during this time. Use hard candies (sugarless Jolly Ranchers) or non-mint or non-menthol containing cough drops during this time to soothe your throat.  Use a cough suppressant (Delsym or what I have prescribed you) around the clock during this time.  After three days, gradually increase the use of your voice and back off on the cough suppressants.  I would still follow-up with Dr. Margurite Auerbach office.   Reflux laryngitis Will start Prilosec (omeprazole) 20 mg tablet daily in the morning >>> Take on empty stomach >>> Wait 30 minutes to an hour before taking other medications and eating  Will start Pepcid 20 mg tablet daily at night  Can do Chloraseptic Spray to help numb the back your throat   Remember to do voice rest: You need to try to suppress your cough to allow your larynx (voice box) to heal.  For three days don't talk, laugh, sing, or clear your throat. Do everything you can to suppress the cough during this time. Use hard candies (sugarless Jolly Ranchers) or non-mint or non-menthol containing cough drops during this time to soothe your throat.  Use a cough suppressant (Delsym or what I have prescribed you) around the clock during this time.  After three days, gradually increase the use of your voice and back off on the cough suppressants.   I would still follow-up with Dr. Margurite Auerbach office.      Lauraine Rinne, NP 02/27/2018

## 2018-02-27 NOTE — Assessment & Plan Note (Signed)
Will start Prilosec (omeprazole) 20 mg tablet daily in the morning >>> Take on empty stomach >>> Wait 30 minutes to an hour before taking other medications and eating  Will start Pepcid 20 mg tablet daily at night  Can do Chloraseptic Spray to help numb the back your throat   Remember to do voice rest: You need to try to suppress your cough to allow your larynx (voice box) to heal.  For three days don't talk, laugh, sing, or clear your throat. Do everything you can to suppress the cough during this time. Use hard candies (sugarless Jolly Ranchers) or non-mint or non-menthol containing cough drops during this time to soothe your throat.  Use a cough suppressant (Delsym or what I have prescribed you) around the clock during this time.  After three days, gradually increase the use of your voice and back off on the cough suppressants.  I would still follow-up with Dr. Margurite Auerbach office.

## 2018-02-27 NOTE — Assessment & Plan Note (Signed)
Will start Prilosec (omeprazole) 20 mg tablet daily in the morning >>> Take on empty stomach >>> Wait 30 minutes to an hour before taking other medications and eating  Will start Pepcid 20 mg tablet daily at night  Can do Chloraseptic Spray to help numb the back your throat   Remember to do voice rest: You need to try to suppress your cough to allow your larynx (voice box) to heal.  For three days don't talk, laugh, sing, or clear your throat. Do everything you can to suppress the cough during this time. Use hard candies (sugarless Jolly Ranchers) or non-mint or non-menthol containing cough drops during this time to soothe your throat.  Use a cough suppressant (Delsym or what I have prescribed you) around the clock during this time.  After three days, gradually increase the use of your voice and back off on the cough suppressants.   I would still follow-up with Dr. Margurite Auerbach office.

## 2018-02-27 NOTE — Telephone Encounter (Signed)
Spoke with pt. States that her cough is much worse since last OV with BQ. While speaking to the pt she is very hoarse and I had a hard time understanding what she was saying. Pt is scheduled for an appointment with Dr. Margurite Auerbach office on 03/03/18. The pt had a lot of complaints. She called Dr. Margurite Auerbach office and she was told that if she is worse she doesn't need to be seen there, she needs to be seen here. I have scheduled the pt for an acute visit with Aaron Edelman today due to the amount of her complaints. Nothing further was needed.

## 2018-02-27 NOTE — Telephone Encounter (Signed)
She did not have phone # for Dr. Greer Ee office, which I gave her [phone]), and she is calling their office now because she is afraid she will not be able to do the PFT once she gets there because she is much worse.  She is aware BQ is on vacation but asked that I put a message back letting him know that her cough is much worse and she is getting very concerned.

## 2018-02-27 NOTE — Patient Instructions (Addendum)
Will start Prilosec (omeprazole) 20 mg tablet daily in the morning >>> Take on empty stomach >>> Wait 30 minutes to an hour before taking other medications and eating  Will start Pepcid 20 mg tablet daily at night  Can do Chloraseptic Spray to help numb the back your throat   Remember to do voice rest: You need to try to suppress your cough to allow your larynx (voice box) to heal.  For three days don't talk, laugh, sing, or clear your throat. Do everything you can to suppress the cough during this time. Use hard candies (sugarless Jolly Ranchers) or non-mint or non-menthol containing cough drops during this time to soothe your throat.  Use a cough suppressant (Delsym or what I have prescribed you) around the clock during this time.  After three days, gradually increase the use of your voice and back off on the cough suppressants.   I would still follow-up with Dr. Margurite Auerbach office.    Please contact the office if your symptoms worsen or you have concerns that you are not improving.   Thank you for choosing [facility] for your healthcare, and for allowing [place] to partner with you on your healthcare journey. I am thankful to be able to provide care to you today.   Wyn Quaker FNP-C      Food Choices for Gastroesophageal Reflux Disease, Adult When you have gastroesophageal reflux disease (GERD), the foods you eat and your eating habits are very important. Choosing the right foods can help ease your discomfort. What guidelines do I need to follow?  Choose fruits, vegetables, whole grains, and low-fat dairy products.  Choose low-fat meat, fish, and poultry.  Limit fats such as oils, salad dressings, butter, nuts, and avocado.  Keep a food diary. This helps you identify foods that cause symptoms.  Avoid foods that cause symptoms. These may be different for everyone.  Eat small meals often instead of 3 large meals a day.  Eat your meals slowly, in a place where you are  relaxed.  Limit fried foods.  Cook foods using methods other than frying.  Avoid drinking alcohol.  Avoid drinking large amounts of liquids with your meals.  Avoid bending over or lying down until 2-3 hours after eating. What foods are not recommended? These are some foods and drinks that may make your symptoms worse: Vegetables Tomatoes. Tomato juice. Tomato and spaghetti sauce. Chili peppers. Onion and garlic. Horseradish. Fruits Oranges, grapefruit, and lemon (fruit and juice). Meats High-fat meats, fish, and poultry. This includes hot dogs, ribs, ham, sausage, salami, and bacon. Dairy Whole milk and chocolate milk. Sour cream. Cream. Butter. Ice cream. Cream cheese. Drinks Coffee and tea. Bubbly (carbonated) drinks or energy drinks. Condiments Hot sauce. Barbecue sauce. Sweets/Desserts Chocolate and cocoa. Donuts. Peppermint and spearmint. Fats and Oils High-fat foods. This includes [place] fries and potato chips. Other Vinegar. Strong spices. This includes black pepper, white pepper, red pepper, cayenne, curry powder, cloves, ginger, and chili powder. The items listed above may not be a complete list of foods and drinks to avoid. Contact your dietitian for more information. This information is not intended to replace advice given to you by your health care provider. Make sure you discuss any questions you have with your health care provider. Document Released: 03/03/2012 Document Revised: 02/08/2016 Document Reviewed: 07/07/2013 Elsevier Interactive Patient Education  2017 [employer].

## 2018-03-03 DIAGNOSIS — Z6822 Body mass index (BMI) 22.0-22.9, adult: Secondary | ICD-10-CM | POA: Diagnosis not present

## 2018-03-03 DIAGNOSIS — E8801 Alpha-1-antitrypsin deficiency: Secondary | ICD-10-CM | POA: Diagnosis not present

## 2018-03-03 DIAGNOSIS — J449 Chronic obstructive pulmonary disease, unspecified: Secondary | ICD-10-CM | POA: Diagnosis not present

## 2018-03-04 DIAGNOSIS — M62838 Other muscle spasm: Secondary | ICD-10-CM | POA: Diagnosis not present

## 2018-03-04 DIAGNOSIS — R102 Pelvic and perineal pain: Secondary | ICD-10-CM | POA: Diagnosis not present

## 2018-03-04 DIAGNOSIS — N3010 Interstitial cystitis (chronic) without hematuria: Secondary | ICD-10-CM | POA: Diagnosis not present

## 2018-03-04 DIAGNOSIS — M6281 Muscle weakness (generalized): Secondary | ICD-10-CM | POA: Diagnosis not present

## 2018-03-06 ENCOUNTER — Encounter: Payer: Self-pay | Admitting: Obstetrics and Gynecology

## 2018-03-10 DIAGNOSIS — M6281 Muscle weakness (generalized): Secondary | ICD-10-CM | POA: Diagnosis not present

## 2018-03-10 DIAGNOSIS — R102 Pelvic and perineal pain: Secondary | ICD-10-CM | POA: Diagnosis not present

## 2018-03-10 DIAGNOSIS — M62838 Other muscle spasm: Secondary | ICD-10-CM | POA: Diagnosis not present

## 2018-03-10 DIAGNOSIS — K5900 Constipation, unspecified: Secondary | ICD-10-CM | POA: Diagnosis not present

## 2018-03-11 ENCOUNTER — Other Ambulatory Visit: Payer: Self-pay | Admitting: Family Medicine

## 2018-03-11 DIAGNOSIS — F419 Anxiety disorder, unspecified: Secondary | ICD-10-CM

## 2018-03-23 DIAGNOSIS — R490 Dysphonia: Secondary | ICD-10-CM | POA: Diagnosis not present

## 2018-03-23 DIAGNOSIS — N3010 Interstitial cystitis (chronic) without hematuria: Secondary | ICD-10-CM | POA: Diagnosis not present

## 2018-03-23 DIAGNOSIS — M62838 Other muscle spasm: Secondary | ICD-10-CM | POA: Diagnosis not present

## 2018-03-23 DIAGNOSIS — M6281 Muscle weakness (generalized): Secondary | ICD-10-CM | POA: Diagnosis not present

## 2018-03-23 DIAGNOSIS — M545 Low back pain: Secondary | ICD-10-CM | POA: Diagnosis not present

## 2018-03-23 DIAGNOSIS — F444 Conversion disorder with motor symptom or deficit: Secondary | ICD-10-CM | POA: Diagnosis not present

## 2018-03-26 DIAGNOSIS — F444 Conversion disorder with motor symptom or deficit: Secondary | ICD-10-CM | POA: Diagnosis not present

## 2018-03-31 ENCOUNTER — Other Ambulatory Visit: Payer: Self-pay | Admitting: Family Medicine

## 2018-04-03 ENCOUNTER — Telehealth: Payer: Self-pay | Admitting: Family Medicine

## 2018-04-03 DIAGNOSIS — G43009 Migraine without aura, not intractable, without status migrainosus: Secondary | ICD-10-CM

## 2018-04-03 NOTE — Telephone Encounter (Signed)
Copied from [phone]. Topic: Quick Communication - Rx Refill/Question >> Apr 03, 2018 12:59 PM Burchel, Abbi R wrote: Medication: topiramate (TOPAMAX) 100 MG tablet, levocetirizine (XYZAL) 5 MG tablet, sertraline (ZOLOFT) 50 MG tablet   Preferred Pharmacy: [place], [place] - [address] AT [place] [phone] (Phone) [phone] (Fax)   Pt was advised that RX refills may take up to 3 business days. Pt is leaving on Tuesday (6am).

## 2018-04-05 ENCOUNTER — Other Ambulatory Visit: Payer: Self-pay | Admitting: Family Medicine

## 2018-04-05 DIAGNOSIS — G43009 Migraine without aura, not intractable, without status migrainosus: Secondary | ICD-10-CM

## 2018-04-05 DIAGNOSIS — F419 Anxiety disorder, unspecified: Secondary | ICD-10-CM

## 2018-04-06 ENCOUNTER — Other Ambulatory Visit: Payer: Self-pay | Admitting: Family Medicine

## 2018-04-06 ENCOUNTER — Telehealth: Payer: Self-pay

## 2018-04-06 DIAGNOSIS — F419 Anxiety disorder, unspecified: Secondary | ICD-10-CM

## 2018-04-06 DIAGNOSIS — G43009 Migraine without aura, not intractable, without status migrainosus: Secondary | ICD-10-CM

## 2018-04-06 MED ORDER — TOPIRAMATE 100 MG PO TABS
ORAL_TABLET | ORAL | 0 refills | Status: DC
Start: 2018-04-06 — End: 2018-05-29

## 2018-04-06 MED ORDER — LEVOCETIRIZINE DIHYDROCHLORIDE 5 MG PO TABS: ORAL_TABLET | ORAL | 0 refills | Status: DC

## 2018-04-06 MED ORDER — SERTRALINE HCL 50 MG PO TABS: ORAL_TABLET | ORAL | 1 refills | Status: DC

## 2018-04-06 NOTE — Telephone Encounter (Signed)
Already filled in another encounter

## 2018-04-06 NOTE — Telephone Encounter (Signed)
Author spoke with pt. About requested lab refills, and advised her to make an appointment with Dr. Etter Sjogren for follow-up. Pt. Stated she would when she returns from [place] vacation, hoping the [place] air will "help with my breathing". Pt. actively seeing pulmonologist. Meds refilled, Dr. Etter Sjogren made aware.

## 2018-04-23 ENCOUNTER — Ambulatory Visit (INDEPENDENT_AMBULATORY_CARE_PROVIDER_SITE_OTHER): Payer: Federal, State, Local not specified - PPO | Admitting: Pulmonary Disease

## 2018-04-23 ENCOUNTER — Encounter: Payer: Self-pay | Admitting: Pulmonary Disease

## 2018-04-23 VITALS — BP 118/70 | HR 82 | Ht 64.0 in | Wt 134.4 lb

## 2018-04-23 DIAGNOSIS — R490 Dysphonia: Secondary | ICD-10-CM | POA: Diagnosis not present

## 2018-04-23 DIAGNOSIS — E8801 Alpha-1-antitrypsin deficiency: Secondary | ICD-10-CM | POA: Diagnosis not present

## 2018-04-23 DIAGNOSIS — J040 Acute laryngitis: Secondary | ICD-10-CM

## 2018-04-23 DIAGNOSIS — R05 Cough: Secondary | ICD-10-CM | POA: Diagnosis not present

## 2018-04-23 DIAGNOSIS — K219 Gastro-esophageal reflux disease without esophagitis: Secondary | ICD-10-CM

## 2018-04-23 DIAGNOSIS — R0600 Dyspnea, unspecified: Secondary | ICD-10-CM

## 2018-04-23 DIAGNOSIS — Z148 Genetic carrier of other disease: Secondary | ICD-10-CM

## 2018-04-23 DIAGNOSIS — R059 Cough, unspecified: Secondary | ICD-10-CM

## 2018-04-23 NOTE — Patient Instructions (Signed)
Cough: For now continue the antacid therapy with omeprazole and famotidine  Airflow obstruction: Continue Advair twice a day Get a flu shot in the fall Practice good hand hygiene Stay active  Tension vocal cord dysphonia: You need to schedule a follow-up visit with the speech therapy clinic, I think he should have at least 2 visits prior to starting school back.  Alpha-1 abnormal gene mutation: We will follow-up with Dr. Margurite Auerbach office in regards to the final genetic analysis.  We will see you back in 6 to 8 weeks or sooner if needed

## 2018-04-23 NOTE — Progress Notes (Signed)
Subjective:   PATIENT ID: Sheila [name] GENDER: female DOB: Jan 22, 1966, MRN: [id number]  Synopsis: Referred in April 2019 for Cough, dyspnea.  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  Has moderate airflow obstruction and Lupus.   HPI  Chief Complaint  Patient presents with  . Follow-up    continued SOB and increased cough. inhalers are helping   Sheila [name] says that she met with Dr Casper Harrison.  She was provided with some information about alpha-1 antitrypsin deficiency, though his note indicates that there is still awaiting the results of the gene mutation assay which was sent off to a genetic sequencing company.  She says that the frequency of her cough is decreased but she still has fairly intense spells and when they occur she will become short of breath.  However, overall her symptoms have significant improved since she is come home.  She continues to take Advair.  She continues to take the antacid therapy.   Past Medical History:  Diagnosis Date  . Anal fissure   . Anxiety   . Chronic interstitial cystitis   . Crohn disease ([place])   . Fibromyalgia   . Migraines   . Pneumonia 2007  . SLE (systemic lupus erythematosus) ([facility])       Review of Systems  Constitutional: Positive for malaise/fatigue. Negative for fever and weight loss.  HENT: Negative for congestion, ear pain, nosebleeds and sore throat.   Eyes: Negative for redness.  Respiratory: Positive for cough and shortness of breath. Negative for wheezing.   Cardiovascular: Negative for palpitations, leg swelling and PND.  Gastrointestinal: Negative for nausea and vomiting.  Genitourinary: Negative for dysuria.  Skin: Negative for rash.  Neurological: Negative for headaches.  Endo/Heme/Allergies: Does not bruise/bleed easily.  Psychiatric/Behavioral: Negative for depression. The patient is not nervous/anxious.       Objective:  Physical Exam   Vitals:   04/23/18 1357  BP:  118/70  Pulse: 82  SpO2: 96%  Weight: 134 lb 6.4 oz (61 kg)  Height: 5' 4"  (1.626 m)    Gen: well appearing HENT: OP clear, TM's clear, neck supple PULM: CTA B, normal percussion CV: RRR, no mgr, trace edema GI: BS+, soft, nontender Derm: no cyanosis or rash Psyche: normal mood and affect     CBC    Component Value Date/Time   WBC 7.6 02/13/2017 0917   RBC 4.54 02/13/2017 0917   HGB 14.4 02/13/2017 0917   HCT 43.4 02/13/2017 0917   PLT 293.0 02/13/2017 0917   MCV 95.7 02/13/2017 0917   MCHC 33.0 02/13/2017 0917   RDW 13.5 02/13/2017 0917   LYMPHSABS 2.0 02/13/2017 0917   MONOABS 0.7 02/13/2017 0917   EOSABS 0.1 02/13/2017 0917   BASOSABS 0.1 02/13/2017 0917     Chest imaging: 10/2017 CXR images reviewed: normal appearing pulmonary parenchyma May 2019 high-resolution CT scan of the chest images independently reviewed showing normal pulmonary parenchyma, no evidence of interstitial lung disease, 3 mm nodule noted   PFT: 11/2017 PFT: Ratio 63%, FEV1 2.23 L (79% pred), FVC 3.72 (105% pred), TLC 5.89L (116% pre), RV 2.48 L (139%), DLCO 24.66 (101% pre)  Labs:  Path:  Echo:  Heart Catheterization:  Records from her visit with Dr. Casper Harrison reviewed.  He feels that she does not have alpha-1 antitrypsin deficiency, he awaits the results of her genetic sequencing.     Assessment & Plan:   Hoarseness, persistent  Reflux laryngitis  Cough  Carrier of alpha-1-antitrypsin deficiency ([place])  Dyspnea, unspecified type  Discussion: Lorry continues to have some cough and the frequency of these episodes have improved since starting antacid therapy.  However, I think the intensity of the cough and the associated dyspnea is related to her vocal cord dysfunction.  I have encouraged her to go back to [facility] for more speech therapy.  I think her airflow obstruction, likely due to asthma, is stable.  Plan: Cough: For now continue the antacid therapy with omeprazole  and famotidine  Airflow obstruction: Continue Advair twice a day Get a flu shot in the fall Practice good hand hygiene Stay active  Tension vocal cord dysphonia: You need to schedule a follow-up visit with the speech therapy clinic, I think he should have at least 2 visits prior to starting school back.  Alpha-1 abnormal gene mutation: We will follow-up with Dr. Margurite Auerbach office in regards to the final genetic analysis.  We will see you back in 6 to 8 weeks or sooner if needed  25 minutes spent with patient, 35 minute visit     Current Outpatient Medications:  .  ALPRAZolam (XANAX) 0.5 MG tablet, TAKE 1 TABLET(0.5 MG) BY MOUTH THREE TIMES DAILY AS NEEDED, Disp: 90 tablet, Rfl: 0 .  famotidine (PEPCID) 20 MG tablet, Take 1 tablet (20 mg total) by mouth 2 (two) times daily., Disp: 30 tablet, Rfl: 0 .  Fluticasone-Salmeterol (ADVAIR DISKUS) 250-50 MCG/DOSE AEPB, Inhale 1 puff into the lungs 2 (two) times daily., Disp: 60 each, Rfl: 5 .  levocetirizine (XYZAL) 5 MG tablet, TAKE 1 TABLET(5 MG) BY MOUTH EVERY EVENING, Disp: 90 tablet, Rfl: 0 .  OGESTREL 0.5-50 MG-MCG tablet, Take 1 tablet by mouth daily., Disp: , Rfl:  .  olopatadine (PATANOL) 0.1 % ophthalmic solution, Place 1 drop into both eyes 2 (two) times daily., Disp: 5 mL, Rfl: 0 .  omeprazole (PRILOSEC) 20 MG capsule, Take 1 capsule (20 mg total) by mouth daily., Disp: 30 capsule, Rfl: 4 .  sertraline (ZOLOFT) 50 MG tablet, TAKE 1 TABLET(50 MG) BY MOUTH DAILY, Disp: 90 tablet, Rfl: 1 .  tiZANidine (ZANAFLEX) 4 MG tablet, 1 po bid prn, Disp: 60 tablet, Rfl: 1 .  topiramate (TOPAMAX) 100 MG tablet, TAKE 1 TABLET(100 MG) BY MOUTH DAILY, Disp: 90 tablet, Rfl: 0 .  traMADol (ULTRAM) 50 MG tablet, TAKE 1 TO 2 TABLETS BY MOUTH EVERY 6 HOURS AS NEEDED FOR PAIN, Disp: 30 tablet, Rfl: 0 .  traZODone (DESYREL) 50 MG tablet, 1-2 po qhs, Disp: 60 tablet, Rfl: 1 .  triamcinolone cream (KENALOG) 0.1 %, Apply 1 application topically 2 (two)  times daily., Disp: 45 g, Rfl: 3

## 2018-04-27 DIAGNOSIS — M62838 Other muscle spasm: Secondary | ICD-10-CM | POA: Diagnosis not present

## 2018-04-27 DIAGNOSIS — M6281 Muscle weakness (generalized): Secondary | ICD-10-CM | POA: Diagnosis not present

## 2018-04-27 DIAGNOSIS — M6289 Other specified disorders of muscle: Secondary | ICD-10-CM | POA: Diagnosis not present

## 2018-04-27 DIAGNOSIS — R102 Pelvic and perineal pain: Secondary | ICD-10-CM | POA: Diagnosis not present

## 2018-05-08 DIAGNOSIS — M546 Pain in thoracic spine: Secondary | ICD-10-CM | POA: Diagnosis not present

## 2018-05-14 DIAGNOSIS — G43709 Chronic migraine without aura, not intractable, without status migrainosus: Secondary | ICD-10-CM | POA: Diagnosis not present

## 2018-05-14 DIAGNOSIS — N3010 Interstitial cystitis (chronic) without hematuria: Secondary | ICD-10-CM | POA: Diagnosis not present

## 2018-05-14 DIAGNOSIS — M797 Fibromyalgia: Secondary | ICD-10-CM | POA: Diagnosis not present

## 2018-05-14 DIAGNOSIS — K5090 Crohn's disease, unspecified, without complications: Secondary | ICD-10-CM | POA: Diagnosis not present

## 2018-05-14 DIAGNOSIS — M25562 Pain in left knee: Secondary | ICD-10-CM | POA: Diagnosis not present

## 2018-05-23 ENCOUNTER — Other Ambulatory Visit: Payer: Self-pay | Admitting: Family Medicine

## 2018-05-29 ENCOUNTER — Other Ambulatory Visit (HOSPITAL_COMMUNITY)
Admission: RE | Admit: 2018-05-29 | Discharge: 2018-05-29 | Disposition: A | Discharge status: Home / Self Care | Payer: Federal, State, Local not specified - PPO | Source: Ambulatory Visit | Attending: Family Medicine | Admitting: Family Medicine

## 2018-05-29 ENCOUNTER — Ambulatory Visit: Payer: Federal, State, Local not specified - PPO | Admitting: Family Medicine

## 2018-05-29 ENCOUNTER — Encounter: Payer: Self-pay | Admitting: Family Medicine

## 2018-05-29 VITALS — BP 106/65 | HR 82 | Temp 99.1°F | Resp 16 | Ht 64.0 in | Wt 133.0 lb

## 2018-05-29 DIAGNOSIS — N898 Other specified noninflammatory disorders of vagina: Secondary | ICD-10-CM | POA: Diagnosis not present

## 2018-05-29 DIAGNOSIS — J302 Other seasonal allergic rhinitis: Secondary | ICD-10-CM | POA: Diagnosis not present

## 2018-05-29 DIAGNOSIS — F418 Other specified anxiety disorders: Secondary | ICD-10-CM

## 2018-05-29 DIAGNOSIS — Z23 Encounter for immunization: Secondary | ICD-10-CM

## 2018-05-29 DIAGNOSIS — G43009 Migraine without aura, not intractable, without status migrainosus: Secondary | ICD-10-CM

## 2018-05-29 DIAGNOSIS — F419 Anxiety disorder, unspecified: Secondary | ICD-10-CM

## 2018-05-29 MED ORDER — SERTRALINE HCL 100 MG PO TABS: 100.0000 mg | ORAL_TABLET | Freq: Every day | ORAL | 3 refills | Status: DC

## 2018-05-29 MED ORDER — TOPIRAMATE 100 MG PO TABS: ORAL_TABLET | ORAL | 3 refills | Status: DC

## 2018-05-29 MED ORDER — SERTRALINE HCL 50 MG PO TABS: ORAL_TABLET | ORAL | 3 refills | Status: DC

## 2018-05-29 MED ORDER — LEVOCETIRIZINE DIHYDROCHLORIDE 5 MG PO TABS: ORAL_TABLET | ORAL | 3 refills | Status: DC

## 2018-05-29 MED ORDER — FLUCONAZOLE 150 MG PO TABS: 150.0000 mg | ORAL_TABLET | Freq: Once | ORAL | 0 refills | Status: AC

## 2018-05-29 NOTE — Progress Notes (Signed)
Patient ID: Sheila [name], female    DOB: 1966/06/29  Age: 52 y.o. MRN: [id number]    Subjective:  Subjective  HPI Sheila [name] presents for f/u depression / anxiety and she was just dx with a 1 antitrypsin deficiency.  Pt anxiety / depression is worsening  She is also c/o vaginal itching and a thick white d/c,  No odor  No abd pain  Review of Systems  Constitutional: Negative for fever.  HENT: Negative for congestion.   Respiratory: Negative for shortness of breath.   Cardiovascular: Negative for chest pain, palpitations and leg swelling.  Gastrointestinal: Negative for abdominal pain, blood in stool and nausea.  Genitourinary: Negative for dysuria and frequency.  Skin: Negative for rash.  Allergic/Immunologic: Negative for environmental allergies.  Neurological: Negative for dizziness and headaches.  Psychiatric/Behavioral: Positive for dysphoric mood and sleep disturbance. Negative for self-injury and suicidal ideas. The patient is nervous/anxious.     History Past Medical History:  Diagnosis Date  . Anal fissure   . Anxiety   . Chronic interstitial cystitis   . Crohn disease ([place])   . Fibromyalgia   . Migraines   . Pneumonia 2007  . SLE (systemic lupus erythematosus) ([place])     She has a past surgical history that includes Cesarean section; Knee arthroscopy; Wrist surgery; Oophorectomy; Bladder surgery; and Tonsillectomy.   Her family history includes Breast cancer in her maternal aunt, maternal grandmother, and mother; Irritable bowel syndrome in her sister; Lung cancer in her father; Prostate cancer in her maternal grandfather.She reports that she has never smoked. She has never used smokeless tobacco. She reports that she does not drink alcohol or use drugs.  Current Outpatient Medications on File Prior to Visit  Medication Sig Dispense Refill  . ALPRAZolam (XANAX) 0.5 MG tablet TAKE 1 TABLET(0.5 MG) BY MOUTH THREE TIMES DAILY AS NEEDED 90 tablet 0  . famotidine  (PEPCID) 20 MG tablet Take 1 tablet (20 mg total) by mouth 2 (two) times daily. 30 tablet 0  . OGESTREL 0.5-50 MG-MCG tablet Take 1 tablet by mouth daily.    Marland Kitchen olopatadine (PATANOL) 0.1 % ophthalmic solution Place 1 drop into both eyes 2 (two) times daily. 5 mL 0  . omeprazole (PRILOSEC) 20 MG capsule Take 1 capsule (20 mg total) by mouth daily. 30 capsule 4  . tiZANidine (ZANAFLEX) 4 MG tablet 1 po bid prn 60 tablet 1  . traMADol (ULTRAM) 50 MG tablet TAKE 1 TO 2 TABLETS BY MOUTH EVERY 6 HOURS AS NEEDED FOR PAIN 30 tablet 0  . traZODone (DESYREL) 50 MG tablet 1-2 po qhs 60 tablet 1  . triamcinolone cream (KENALOG) 0.1 % Apply 1 application topically 2 (two) times daily. 45 g 3  . WIXELA INHUB 250-50 MCG/DOSE AEPB INHALE 1 PUFF INTO THE LUNGS TWICE DAILY 60 each 5   No current facility-administered medications on file prior to visit.      Objective:  Objective  Physical Exam  Constitutional: She is oriented to person, place, and time. She appears well-developed and well-nourished.  HENT:  Head: Normocephalic and atraumatic.  Eyes: Conjunctivae and EOM are normal.  Neck: Normal range of motion. Neck supple. No JVD present. Carotid bruit is not present. No thyromegaly present.  Cardiovascular: Normal rate, regular rhythm and normal heart sounds.  No murmur heard. Pulmonary/Chest: Effort normal and breath sounds normal. No respiratory distress. She has no wheezes. She has no rales. She exhibits no tenderness.  Musculoskeletal: She exhibits no edema.  Neurological: She is alert and oriented to person, place, and time.  Psychiatric: Her speech is normal and behavior is normal. Judgment and thought content normal. Her mood appears anxious. Cognition and memory are normal. She exhibits a depressed mood.  Nursing note and vitals reviewed.  BP 106/65 (BP Location: Left Arm, Patient Position: Sitting, Cuff Size: Small)   Pulse 82   Temp 99.1 F (37.3 C) (Oral)   Resp 16   Ht 5' 4"  (1.626 m)    Wt 133 lb (60.3 kg)   SpO2 99%   BMI 22.83 kg/m  Wt Readings from Last 3 Encounters:  05/29/18 133 lb (60.3 kg)  04/23/18 134 lb 6.4 oz (61 kg)  02/27/18 132 lb 12.8 oz (60.2 kg)     Lab Results  Component Value Date   WBC 7.6 02/13/2017   HGB 14.4 02/13/2017   HCT 43.4 02/13/2017   PLT 293.0 02/13/2017   GLUCOSE 88 02/13/2017   ALT 28 02/13/2017   AST 28 02/13/2017   NA 139 02/13/2017   K 4.0 02/13/2017   CL 108 02/13/2017   CREATININE 0.95 02/13/2017   BUN 14 02/13/2017   CO2 26 02/13/2017   TSH 0.65 02/13/2017    Ct Chest High Resolution  Result Date: 01/14/2018 CLINICAL DATA:  Chronic dyspnea for 8-10 months. Cough. History of SLE. Nonsmoker. EXAM: CT CHEST WITHOUT CONTRAST TECHNIQUE: Multidetector CT imaging of the chest was performed following the standard protocol without intravenous contrast. High resolution imaging of the lungs, as well as inspiratory and expiratory imaging, was performed. COMPARISON:  11/18/2017 chest radiograph. FINDINGS: Cardiovascular: Normal heart size. No significant pericardial fluid/thickening. Left anterior descending coronary atherosclerosis. Great vessels are normal in course and caliber. Mediastinum/Nodes: No discrete thyroid nodules. Unremarkable esophagus. No pathologically enlarged axillary, mediastinal or gross hilar lymph nodes, noting limited sensitivity for the detection of hilar adenopathy on this noncontrast study. Lungs/Pleura: No pneumothorax. No pleural effusion. No acute consolidative airspace disease or lung masses. Subpleural 3 mm solid pulmonary nodule in the anterior right middle lobe (series 5/image 108). No additional significant pulmonary nodules. No significant regions of subpleural reticulation, ground-glass attenuation, parenchymal banding, traction bronchiectasis, architectural distortion or frank honeycombing. No significant air trapping on the expiration sequence. Upper abdomen: No acute abnormality. Musculoskeletal: No  aggressive appearing focal osseous lesions. Healed deformity centrally in the T9 vertebral body without significant loss of vertebral body height. Minimal thoracic spondylosis. IMPRESSION: 1. No evidence of interstitial lung disease. 2. Solitary subpleural 3 mm solid right middle lobe pulmonary nodule, almost certainly benign. No follow-up needed if patient is low-risk. Non-contrast chest CT can be considered in 12 months if patient is high-risk. This recommendation follows the consensus statement: Guidelines for Management of Incidental Pulmonary Nodules Detected on CT Images:From the Fleischner Society 2017; published online before print (10.1148/radiol.1423953202). 3. One vessel coronary atherosclerosis. Electronically Signed   By: Ilona Sorrel M.D.   On: 01/14/2018 12:02     Assessment & Plan:  Plan  I have discontinued [place] [employer] [place]'s sertraline and sertraline. I am also having her start on fluconazole and sertraline. Additionally, I am having her maintain her OGESTREL, triamcinolone cream, traZODone, tiZANidine, olopatadine, ALPRAZolam, traMADol, omeprazole, famotidine, WIXELA INHUB, topiramate, and levocetirizine.  Meds ordered this encounter  Medications  . fluconazole (DIFLUCAN) 150 MG tablet    Sig: Take 1 tablet (150 mg total) by mouth once for 1 dose. May repeat in 3 days prn    Dispense:  2 tablet    Refill:  0  . topiramate (TOPAMAX) 100 MG tablet    Sig: TAKE 1 TABLET(100 MG) BY MOUTH DAILY    Dispense:  90 tablet    Refill:  3    **Patient requests 90 days supply**  . levocetirizine (XYZAL) 5 MG tablet    Sig: TAKE 1 TABLET(5 MG) BY MOUTH EVERY EVENING    Dispense:  90 tablet    Refill:  3  . DISCONTD: sertraline (ZOLOFT) 50 MG tablet    Sig: 1 po qd    Dispense:  90 tablet    Refill:  3    **Patient requests 90 days supply**  . sertraline (ZOLOFT) 100 MG tablet    Sig: Take 1 tablet (100 mg total) by mouth daily.    Dispense:  90 tablet    Refill:  3    Problem  List Items Addressed This Visit      Unprioritized   Depression with anxiety    Inc zoloft to 18m daily Names and numbers for counselors given to pt  F/u 1 monthn or sooner prn      Relevant Medications   sertraline (ZOLOFT) 100 MG tablet   Migraine without aura and without status migrainosus, not intractable   Relevant Medications   topiramate (TOPAMAX) 100 MG tablet   sertraline (ZOLOFT) 100 MG tablet    Other Visit Diagnoses    Needs flu shot    -  Primary   Relevant Orders   Flu Vaccine QUAD 36+ mos IM (Fluarix & Fluzone Quad PF (Completed)   Vaginal discharge       Relevant Orders   Urine cytology ancillary only   Anxiety       Relevant Medications   sertraline (ZOLOFT) 100 MG tablet   Seasonal allergies       Relevant Medications   levocetirizine (XYZAL) 5 MG tablet   Need for 23-polyvalent pneumococcal polysaccharide vaccine       Relevant Orders   Pneumococcal polysaccharide vaccine 23-valent greater than or equal to 2yo subcutaneous/IM (Completed)      Follow-up: Return in about 6 months (around 11/27/2018), or if symptoms worsen or fail to improve.  YAnn Held DO

## 2018-05-29 NOTE — Patient Instructions (Signed)
Living With Depression Everyone experiences occasional disappointment, sadness, and loss in their lives. When you are feeling down, blue, or sad for at least 2 weeks in a row, it may mean that you have depression. Depression can affect your thoughts and feelings, relationships, daily activities, and physical health. It is caused by changes in the way your brain functions. If you receive a diagnosis of depression, your health care provider will tell you which type of depression you have and what treatment options are available to you. If you are living with depression, there are ways to help you recover from it and also ways to prevent it from coming back. How to cope with lifestyle changes Coping with stress Stress is your body's reaction to life changes and events, both good and bad. Stressful situations may include:  Getting married.  The death of a spouse.  Losing a job.  Retiring.  Having a baby.  Stress can last just a few hours or it can be ongoing. Stress can play a major role in depression, so it is important to learn both how to cope with stress and how to think about it differently. Talk with your health care provider or a counselor if you would like to learn more about stress reduction. He or she may suggest some stress reduction techniques, such as:  Music therapy. This can include creating music or listening to music. Choose music that you enjoy and that inspires you.  Mindfulness-based meditation. This kind of meditation can be done while sitting or walking. It involves being aware of your normal breaths, rather than trying to control your breathing.  Centering prayer. This is a kind of meditation that involves focusing on a spiritual word or phrase. Choose a word, phrase, or sacred image that is meaningful to you and that brings you peace.  Deep breathing. To do this, expand your stomach and inhale slowly through your nose. Hold your breath for 3-5 seconds, then exhale  slowly, allowing your stomach muscles to relax.  Muscle relaxation. This involves intentionally tensing muscles then relaxing them.  Choose a stress reduction technique that fits your lifestyle and personality. Stress reduction techniques take time and practice to develop. Set aside 5-15 minutes a day to do them. Therapists can offer training in these techniques. The training may be covered by some insurance plans. Other things you can do to manage stress include:  Keeping a stress diary. This can help you learn what triggers your stress and ways to control your response.  Understanding what your limits are and saying no to requests or events that lead to a schedule that is too full.  Thinking about how you respond to certain situations. You may not be able to control everything, but you can control how you react.  Adding humor to your life by watching funny films or TV shows.  Making time for activities that help you relax and not feeling guilty about spending your time this way.  Medicines Your health care provider may suggest certain medicines if he or she feels that they will help improve your condition. Avoid using alcohol and other substances that may prevent your medicines from working properly (may interact). It is also important to:  Talk with your pharmacist or health care provider about all the medicines that you take, their possible side effects, and what medicines are safe to take together.  Make it your goal to take part in all treatment decisions (shared decision-making). This includes giving input on the side  effects of medicines. It is best if shared decision-making with your health care provider is part of your total treatment plan.  If your health care provider prescribes a medicine, you may not notice the full benefits of it for 4-8 weeks. Most people who are treated for depression need to be on medicine for at least 6-12 months after they feel better. If you are taking  medicines as part of your treatment, do not stop taking medicines without first talking to your health care provider. You may need to have the medicine slowly decreased (tapered) over time to decrease the risk of harmful side effects. Relationships Your health care provider may suggest family therapy along with individual therapy and drug therapy. While there may not be family problems that are causing you to feel depressed, it is still important to make sure your family learns as much as they can about your mental health. Having your family's support can help make your treatment successful. How to recognize changes in your condition Everyone has a different response to treatment for depression. Recovery from major depression happens when you have not had signs of major depression for two months. This may mean that you will start to:  Have more interest in doing activities.  Feel less hopeless than you did 2 months ago.  Have more energy.  Overeat less often, or have better or improving appetite.  Have better concentration.  Your health care provider will work with you to decide the next steps in your recovery. It is also important to recognize when your condition is getting worse. Watch for these signs:  Having fatigue or low energy.  Eating too much or too little.  Sleeping too much or too little.  Feeling restless, agitated, or hopeless.  Having trouble concentrating or making decisions.  Having unexplained physical complaints.  Feeling irritable, angry, or aggressive.  Get help as soon as you or your family members notice these symptoms coming back. How to get support and help from others How to talk with friends and family members about your condition Talking to friends and family members about your condition can provide you with one way to get support and guidance. Reach out to trusted friends or family members, explain your symptoms to them, and let them know that you are  working with a health care provider to treat your depression. Financial resources Not all insurance plans cover mental health care, so it is important to check with your insurance carrier. If paying for co-pays or counseling services is a problem, search for a local or county mental health care center. They may be able to offer public mental health care services at low or no cost when you are not able to see a private health care provider. If you are taking medicine for depression, you may be able to get the generic form, which may be less expensive. Some makers of prescription medicines also offer help to patients who cannot afford the medicines they need. Follow these instructions at home:  Get the right amount and quality of sleep.  Cut down on using caffeine, tobacco, alcohol, and other potentially harmful substances.  Try to exercise, such as walking or lifting small weights.  Take over-the-counter and prescription medicines only as told by your health care provider.  Eat a healthy diet that includes plenty of vegetables, fruits, whole grains, low-fat dairy products, and lean protein. Do not eat a lot of foods that are high in solid fats, added sugars, or salt.  Keep all follow-up visits as told by your health care provider. This is important. Contact a health care provider if:  You stop taking your antidepressant medicines, and you have any of these symptoms: ? Nausea. ? Headache. ? Feeling lightheaded. ? Chills and body aches. ? Not being able to sleep (insomnia).  You or your friends and family think your depression is getting worse. Get help right away if:  You have thoughts of hurting yourself or others. If you ever feel like you may hurt yourself or others, or have thoughts about taking your own life, get help right away. You can go to your nearest emergency department or call:  Your local emergency services (911 in the [place]  A suicide crisis helpline, such as the  [place] at 973-508-4836. This is open 24-hours a day.  Summary  If you are living with depression, there are ways to help you recover from it and also ways to prevent it from coming back.  Work with your health care team to create a management plan that includes counseling, stress management techniques, and healthy lifestyle habits. This information is not intended to replace advice given to you by your health care provider. Make sure you discuss any questions you have with your health care provider. Document Released: 08/05/2016 Document Revised: 08/05/2016 Document Reviewed: 08/05/2016 Elsevier Interactive Patient Education  [employer].

## 2018-05-29 NOTE — Progress Notes (Signed)
URINE

## 2018-05-30 NOTE — Assessment & Plan Note (Signed)
Inc zoloft to 162m daily Names and numbers for counselors given to pt  F/u 1 monthn or sooner prn

## 2018-06-01 LAB — URINE CYTOLOGY ANCILLARY ONLY
CHLAMYDIA, DNA PROBE: NEGATIVE
Neisseria Gonorrhea: NEGATIVE
Trichomonas: NEGATIVE

## 2018-06-02 ENCOUNTER — Encounter: Payer: Self-pay | Admitting: Family Medicine

## 2018-06-02 LAB — URINE CYTOLOGY ANCILLARY ONLY
BACTERIAL VAGINITIS: NEGATIVE
CANDIDA VAGINITIS: NEGATIVE

## 2018-06-02 NOTE — Telephone Encounter (Signed)
She needs a pelvic exam ----either here or with gyn

## 2018-06-10 ENCOUNTER — Other Ambulatory Visit (HOSPITAL_COMMUNITY): Payer: Self-pay | Admitting: Physician Assistant

## 2018-06-10 DIAGNOSIS — M25561 Pain in right knee: Secondary | ICD-10-CM

## 2018-06-10 DIAGNOSIS — M25562 Pain in left knee: Secondary | ICD-10-CM | POA: Diagnosis not present

## 2018-06-13 ENCOUNTER — Ambulatory Visit (HOSPITAL_BASED_OUTPATIENT_CLINIC_OR_DEPARTMENT_OTHER)
Admission: RE | Admit: 2018-06-13 | Discharge: 2018-06-13 | Disposition: A | Discharge status: Home / Self Care | Payer: Federal, State, Local not specified - PPO | Source: Ambulatory Visit | Attending: Physician Assistant | Admitting: Physician Assistant

## 2018-06-13 ENCOUNTER — Encounter (HOSPITAL_BASED_OUTPATIENT_CLINIC_OR_DEPARTMENT_OTHER): Payer: Self-pay

## 2018-06-13 DIAGNOSIS — M25561 Pain in right knee: Secondary | ICD-10-CM

## 2018-06-16 DIAGNOSIS — M25562 Pain in left knee: Secondary | ICD-10-CM | POA: Diagnosis not present

## 2018-06-18 ENCOUNTER — Ambulatory Visit (INDEPENDENT_AMBULATORY_CARE_PROVIDER_SITE_OTHER): Payer: Federal, State, Local not specified - PPO | Admitting: Pulmonary Disease

## 2018-06-18 ENCOUNTER — Encounter: Payer: Self-pay | Admitting: Pulmonary Disease

## 2018-06-18 VITALS — BP 111/74 | HR 82 | Ht 64.0 in

## 2018-06-18 DIAGNOSIS — R490 Dysphonia: Secondary | ICD-10-CM | POA: Diagnosis not present

## 2018-06-18 DIAGNOSIS — Z148 Genetic carrier of other disease: Secondary | ICD-10-CM | POA: Diagnosis not present

## 2018-06-18 DIAGNOSIS — J4530 Mild persistent asthma, uncomplicated: Secondary | ICD-10-CM | POA: Diagnosis not present

## 2018-06-18 NOTE — Patient Instructions (Signed)
Mild persistent asthma: Continue taking Wixela twice a day I am glad you have had a flu shot Practice good hand hygiene Stay active  Tension vocal cord dysphonia: Continue speech therapy  Likely meniscal tear of left knee: From my standpoint it is safe for you to have surgery I would favor monitored anesthesia care over general anesthesia  We will see you back in June 2020 or sooner if needed

## 2018-06-18 NOTE — Progress Notes (Signed)
Subjective:   PATIENT ID: Sheila [name] GENDER: female DOB: 04/10/1966, MRN: [id number]  Synopsis: Referred in April 2019 for Cough, dyspnea.  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  Has moderate airflow obstruction and Lupus.   HPI  Chief Complaint  Patient presents with  . Follow-up    2 month follow up. Per patient, her hoarseness has improved since last visit. States her breathing is stable at this point. May need surgery for left knee and wants to be cleared from pulmonary standpoint.    [name] has been doing well since the last visit.  She has not had problems with shortness of breath cough or wheezing.  She continues to take her inhaled combination long-acting beta agonist and inhaled corticosteroid medicine twice a day.  She does not feel that breathing interferes with her quality of life from day-to-day.  She still has some trouble with hoarseness from time to time and has been participating in her voice exercises every day as directed by the [facility] speech therapy center.   Past Medical History:  Diagnosis Date  . Anal fissure   . Anxiety   . Chronic interstitial cystitis   . Crohn disease ([place])   . Fibromyalgia   . Migraines   . Pneumonia 2007  . SLE (systemic lupus erythematosus) ([facility])       Review of Systems  Constitutional: Positive for malaise/fatigue. Negative for fever and weight loss.  HENT: Negative for congestion, ear pain, nosebleeds and sore throat.   Eyes: Negative for redness.  Respiratory: Positive for cough and shortness of breath. Negative for wheezing.   Cardiovascular: Negative for palpitations, leg swelling and PND.  Gastrointestinal: Negative for nausea and vomiting.  Genitourinary: Negative for dysuria.  Skin: Negative for rash.  Neurological: Negative for headaches.  Endo/Heme/Allergies: Does not bruise/bleed easily.  Psychiatric/Behavioral: Negative for depression. The patient is not  nervous/anxious.       Objective:  Physical Exam   Vitals:   06/18/18 1355  BP: 111/74  Pulse: 82  SpO2: 96%  Height: 5' 4"  (1.626 m)    Gen: well appearing HENT: OP clear, TM's clear, neck supple PULM: CTA B, normal percussion CV: RRR, no mgr, trace edema GI: BS+, soft, nontender Derm: no cyanosis or rash Psyche: normal mood and affect      CBC    Component Value Date/Time   WBC 7.6 02/13/2017 0917   RBC 4.54 02/13/2017 0917   HGB 14.4 02/13/2017 0917   HCT 43.4 02/13/2017 0917   PLT 293.0 02/13/2017 0917   MCV 95.7 02/13/2017 0917   MCHC 33.0 02/13/2017 0917   RDW 13.5 02/13/2017 0917   LYMPHSABS 2.0 02/13/2017 0917   MONOABS 0.7 02/13/2017 0917   EOSABS 0.1 02/13/2017 0917   BASOSABS 0.1 02/13/2017 0917     Chest imaging: 10/2017 CXR images reviewed: normal appearing pulmonary parenchyma May 2019 high-resolution CT scan of the chest images independently reviewed showing normal pulmonary parenchyma, no evidence of interstitial lung disease, 3 mm nodule noted   PFT: 11/2017 PFT: Ratio 63%, FEV1 2.23 L (79% pred), FVC 3.72 (105% pred), TLC 5.89L (116% pre), RV 2.48 L (139%), DLCO 24.66 (101% pre)  Labs:  Path:  Echo:  Heart Catheterization:  Records from her visit with Dr. Casper Harrison reviewed.  He feels that she does not have alpha-1 antitrypsin deficiency, he awaits the results of her genetic sequencing.     Assessment & Plan:  No diagnosis found.  Discussion: This has been a stable interval for Sheila [name].  She has not had an exacerbation of asthma and her vocal cord issues seem to be better with speech therapy.  She has had a flu shot this year.  Plan: Mild persistent asthma: Continue taking Wixela twice a day I am glad you have had a flu shot Practice good hand hygiene Stay active  Tension vocal cord dysphonia: Continue speech therapy  Likely meniscal tear of left knee: From my standpoint it is safe for you to have surgery I would favor  monitored anesthesia care over general anesthesia  We will see you back in June 2020 or sooner if needed    Current Outpatient Medications:  .  ALPRAZolam (XANAX) 0.5 MG tablet, TAKE 1 TABLET(0.5 MG) BY MOUTH THREE TIMES DAILY AS NEEDED, Disp: 90 tablet, Rfl: 0 .  famotidine (PEPCID) 20 MG tablet, Take 1 tablet (20 mg total) by mouth 2 (two) times daily., Disp: 30 tablet, Rfl: 0 .  levocetirizine (XYZAL) 5 MG tablet, TAKE 1 TABLET(5 MG) BY MOUTH EVERY EVENING, Disp: 90 tablet, Rfl: 3 .  OGESTREL 0.5-50 MG-MCG tablet, Take 1 tablet by mouth daily., Disp: , Rfl:  .  olopatadine (PATANOL) 0.1 % ophthalmic solution, Place 1 drop into both eyes 2 (two) times daily., Disp: 5 mL, Rfl: 0 .  omeprazole (PRILOSEC) 20 MG capsule, Take 1 capsule (20 mg total) by mouth daily., Disp: 30 capsule, Rfl: 4 .  sertraline (ZOLOFT) 100 MG tablet, Take 1 tablet (100 mg total) by mouth daily., Disp: 90 tablet, Rfl: 3 .  tiZANidine (ZANAFLEX) 4 MG tablet, 1 po bid prn, Disp: 60 tablet, Rfl: 1 .  topiramate (TOPAMAX) 100 MG tablet, TAKE 1 TABLET(100 MG) BY MOUTH DAILY, Disp: 90 tablet, Rfl: 3 .  traMADol (ULTRAM) 50 MG tablet, TAKE 1 TO 2 TABLETS BY MOUTH EVERY 6 HOURS AS NEEDED FOR PAIN, Disp: 30 tablet, Rfl: 0 .  traZODone (DESYREL) 50 MG tablet, 1-2 po qhs, Disp: 60 tablet, Rfl: 1 .  triamcinolone cream (KENALOG) 0.1 %, Apply 1 application topically 2 (two) times daily., Disp: 45 g, Rfl: 3 .  WIXELA INHUB 250-50 MCG/DOSE AEPB, INHALE 1 PUFF INTO THE LUNGS TWICE DAILY, Disp: 60 each, Rfl: 5

## 2018-06-23 DIAGNOSIS — M25562 Pain in left knee: Secondary | ICD-10-CM | POA: Diagnosis not present

## 2018-07-09 ENCOUNTER — Ambulatory Visit: Payer: Federal, State, Local not specified - PPO | Attending: Orthopedic Surgery | Admitting: Physical Therapy

## 2018-07-09 ENCOUNTER — Encounter: Payer: Self-pay | Admitting: Physical Therapy

## 2018-07-09 ENCOUNTER — Other Ambulatory Visit: Payer: Self-pay

## 2018-07-09 DIAGNOSIS — M25562 Pain in left knee: Secondary | ICD-10-CM | POA: Diagnosis not present

## 2018-07-09 DIAGNOSIS — R262 Difficulty in walking, not elsewhere classified: Secondary | ICD-10-CM | POA: Diagnosis not present

## 2018-07-09 DIAGNOSIS — M25662 Stiffness of left knee, not elsewhere classified: Secondary | ICD-10-CM | POA: Diagnosis not present

## 2018-07-09 DIAGNOSIS — G8929 Other chronic pain: Secondary | ICD-10-CM | POA: Diagnosis not present

## 2018-07-09 DIAGNOSIS — M6281 Muscle weakness (generalized): Secondary | ICD-10-CM | POA: Diagnosis not present

## 2018-07-09 NOTE — Therapy (Signed)
[facility] [address]  [place], [place], [address] Phone: [phone]   Fax:  [phone]  Physical Therapy Evaluation  Patient Details  Name: Sheila [name] MRN: [id number] Date of Birth: 09-12-1972 Referring Provider (PT): Gaynelle Arabian, MD   Encounter Date: 07/09/2018  PT End of Session - 07/09/18 1631    Visit Number  1    Number of Visits  9    Date for PT Re-Evaluation  08/06/18    Authorization Type  Federal [employer]    PT Start Time  1446    PT Stop Time  1529    PT Time Calculation (min)  43 min    Activity Tolerance  Patient tolerated treatment well;Patient limited by pain    Behavior During Therapy  [facility] for tasks assessed/performed       Past Medical History:  Diagnosis Date  . Anal fissure   . Anxiety   . Chronic interstitial cystitis   . Crohn disease ([place])   . Fibromyalgia   . Migraines   . Pneumonia 2007  . SLE (systemic lupus erythematosus) ([place])     Past Surgical History:  Procedure Laterality Date  . BLADDER SURGERY    . CESAREAN SECTION    . KNEE ARTHROSCOPY    . OOPHORECTOMY     right  . TONSILLECTOMY    . WRIST SURGERY      There were no vitals filed for this visit.   Subjective Assessment - 07/09/18 1448    Subjective  Patient reports L knee pain started about 6 months ago. Reports knee started catching, popping, and locking to the point where she could not move her knee, could not squat. This locking caused falling- fell in shower and bruised back and bottom, then fell another time and scraped wrist. Pain started underneath knee cap, then moved laterally. Saw MD who gave her brace, but she has stopped using it because it caused more harm than good. 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 Had cortisone shot which gave no relief. MD advised to try PT for 4 weeks and then may talk about surgery. Also has hx of "tearing all collateral  ligaments in L knee" with repair, L knee arthroscopy, and L knee fx.     Pertinent History  SLE, migraine, crohn's disease, chronic interstitial cystitis, chronic thoracic back pain, fibromyalgia    Limitations  Sitting;Lifting;Standing;Walking;House hold activities    How long can you sit comfortably?  2 min if sitting with B knees bent    How long can you stand comfortably?  15 min    How long can you walk comfortably?  5-7 min    Diagnostic tests  per MD- L knee MRI showed chondramalacia patellae     Patient Stated Goals  to avoid surgery    Currently in Pain?  Yes    Pain Score  7     Pain Location  Knee    Pain Orientation  Left;Lateral    Pain Descriptors / Indicators  Sharp;Shooting;Throbbing    Pain Type  Chronic pain         [facility] PT Assessment - 07/09/18 1506      Assessment   Medical Diagnosis  Pain in L knee    Referring Provider (PT)  Gaynelle Arabian, MD    Onset Date/Surgical Date  01/07/18    Next MD Visit  --   pt unsure  Prior Therapy  Yes- knee      Precautions   Precautions  --   fibromyalgia     Restrictions   Weight Bearing Restrictions  No      Balance Screen   Has the patient fallen in the past 6 months  Yes    How many times?  2    Has the patient had a decrease in activity level because of a fear of falling?   Yes    Is the patient reluctant to leave their home because of a fear of falling?   No      Home Film/video editor residence    Living Arrangements  Spouse/significant other    Available Help at Discharge  Family    Type of [place] to enter    Entrance Stairs-Number of Steps  1    Entrance Stairs-Rails  None    Home Layout  Two level    Alternate Level Stairs-Number of Steps  15    Alternate Level Stairs-Rails  Right      Prior Function   Level of Independence  Independent    Vocation  Self employed    Equities trader    Leisure  walking      Cognition   Overall  Cognitive Status  Within Functional Limits for tasks assessed      Observation/Other Assessments   Focus on Therapeutic Outcomes (FOTO)   Knee: 33 (67% limited, 49% predicted)      Sensation   Light Touch  Appears Intact      Coordination   Gross Motor Movements are Fluid and Coordinated  Yes      Posture/Postural Control   Posture/Postural Control  Postural limitations    Postural Limitations  Forward head;Rounded Shoulders   sitting with L LE straight     ROM / Strength   AROM / PROM / Strength  AROM;PROM;Strength      AROM   AROM Assessment Site  Knee    Right/Left Knee  Right;Left    Right Knee Extension  2    Right Knee Flexion  126    Left Knee Extension  6   pain   Left Knee Flexion  80   pain     PROM   PROM Assessment Site  Knee    Right/Left Knee  Right;Left    Right Knee Extension  0    Right Knee Flexion  133    Left Knee Extension  4   pain   Left Knee Flexion  86   pain     Strength   Strength Assessment Site  Hip;Knee;Ankle    Right/Left Hip  Right;Left    Right Hip Flexion  4/5    Right Hip ABduction  4/5    Right Hip ADduction  4-/5    Left Hip Flexion  2+/5    Left Hip ABduction  3+/5    Left Hip ADduction  3+/5    Right/Left Knee  Right;Left    Right Knee Flexion  5/5    Right Knee Extension  5/5    Left Knee Flexion  3+/5   pain in R knee   Left Knee Extension  4-/5   pain in R knee   Right/Left Ankle  Right;Left    Right Ankle Dorsiflexion  4+/5    Right Ankle Plantar Flexion  4+/5    Left Ankle Dorsiflexion  4-/5   pain in R knee   Left Ankle Plantar Flexion  4-/5   pain in R knee     Palpation   Patella mobility  extreme guarding with inferior glide of L knee    Palpation comment  TTP in L lateral knee, superior to patella, patellar tendon, posterior knee, lateral distal HS      Ambulation/Gait   Gait Pattern  Step-through pattern;Decreased step length - right;Decreased stance time - left;Decreased hip/knee flexion -  left;Decreased dorsiflexion - left;Decreased weight shift to left;Antalgic    Gait velocity  decreased                Objective measurements completed on examination: See above findings.              PT Education - 07/09/18 1631    Education Details  prognosis, POC, HEP    Person(s) Educated  Patient    Methods  Explanation;Demonstration;Tactile cues;Verbal cues;Handout    Comprehension  Verbalized understanding       PT Short Term Goals - 07/09/18 1640      PT SHORT TERM GOAL #1   Title  Patient to be independent with initial HEP.    Time  2    Period  Weeks    Status  New    Target Date  07/23/18        PT Long Term Goals - 07/09/18 1640      PT LONG TERM GOAL #1   Title  Patient to be independent with advanced HEP.    Time  4    Period  Weeks    Status  New    Target Date  08/06/18      PT LONG TERM GOAL #2   Title  Patient to demonstrate [facility] and pain-free L knee AROM.    Time  4    Period  Weeks    Status  New    Target Date  08/06/18      PT LONG TERM GOAL #3   Title  Patient to demonstrate >=4+/5 strength in B LEs.    Time  4    Period  Weeks    Status  New    Target Date  08/06/18      PT LONG TERM GOAL #4   Title  Patient to report tolerance of 30 min of walking without pain limiting.     Time  4    Period  Weeks    Status  New    Target Date  08/06/18      PT LONG TERM GOAL #5   Title  Patient to demonstrate squatting to lift object off ground with <3/10 pain.    Time  4    Period  Weeks    Status  New    Target Date  08/06/18             Plan - 07/09/18 1632    Clinical Impression Statement  Patient is a 52y/o F presenting to OPPT with c/o L knee pain of 6 months duration. Per MD- MRI shows chondromalacia patellae. Reports slow progressive worsening of symptoms. Pain started under patella, then moved laterally. Reports L knee catching, popping, and locking to the point where she could not squat, which also caused 2  falls. Patient reporting chronic hx of injuries and surgeries to this knee. Patient today wearing knee brace however notes that she usually does not wear it d/t not receiving benefit from it.  Ambulating with significant pain and gait deviations, however not using AD. Demonstrates significant limitation and pain with L knee ROM, limited and painful strength testing, pain and guarding with inferior patellar glide, significant diffuse tenderness, and gait deviations. Advised patient to try single or 2 crutches as she mentioned pain in hips from walking with gait deviations for so long d/t pain. Educated on and received handout for gentle strengthening and ROM HEP. Patient reported understanding. Would benefit from skilled PT services 2x/week for 4 weeks to address aforementioned impairments.     Clinical Presentation  Stable    Clinical Decision Making  Low    Rehab Potential  Good    Clinical Impairments Affecting Rehab Potential  SLE, migraine, crohn's disease, chronic interstitial cystitis, chronic thoracic back pain, fibromyalgia    PT Frequency  2x / week    PT Duration  4 weeks    PT Treatment/Interventions  ADLs/Self Care Home Management;Cryotherapy;Electrical Stimulation;Iontophoresis 40m/ml Dexamethasone;Moist Heat;Ultrasound;DME Instruction;Gait training;Stair training;Functional mobility training;Therapeutic activities;Therapeutic exercise;Manual techniques;Orthotic Fit/Training;Patient/family education;Neuromuscular re-education;Balance training;Scar mobilization;Passive range of motion;Dry needling;Energy conservation;Splinting;Taping;Vasopneumatic Device    PT Next Visit Plan  reassess HEP    Consulted and Agree with Plan of Care  Patient       Patient will benefit from skilled therapeutic intervention in order to improve the following deficits and impairments:  Decreased activity tolerance, Decreased strength, Pain, Difficulty walking, Decreased balance, Decreased range of motion, Postural  dysfunction  Visit Diagnosis: Chronic pain of left knee  Stiffness of left knee, not elsewhere classified  Muscle weakness (generalized)  Difficulty in walking, not elsewhere classified     Problem List Patient Active Problem List   Diagnosis Date Noted  . Hoarseness, persistent 02/27/2018  . Reflux laryngitis 02/27/2018  . Rectal pain 03/05/2017  . H/O Crohn's disease 03/05/2017  . Bloating 03/05/2017  . Migraine without aura and without status migrainosus, not intractable 08/27/2016  . Depression with anxiety 08/04/2016  . Fibromyalgia 08/04/2016  . Migraines 07/11/2015  . Rash and nonspecific skin eruption 04/13/2015  . Memory loss of unknown cause 04/13/2015  . External hemorrhoid 02/25/2013  . Left knee pain 05/04/2012  . Anxiety state 11/26/2010  . DEPRESSIVE DISORDER 01/11/2009  . ABDOMINAL BLOATING 08/19/2008  . CROHN'S DISEASE 06/29/2008  . LYMPHOPENIA 11/19/2007  . HYPERTHYROIDISM 11/05/2007  . CERVICAL LYMPHADENOPATHY 11/03/2007  . CHRONIC INTERSTITIAL CYSTITIS 08/04/2007  . LUPUS ERYTHEMATOSUS 08/04/2007  . Fibromyalgia muscle pain 08/04/2007  . Chest pain 08/04/2007  . HEMATURIA, HX OF 08/04/2007    YJanene Harvey PT, DPT 07/09/18 4:43 PM   [facility] [address]Phone: [phone]  Fax:  [phone] Name: CWILLETTE MUDRYMRN: [id number] of Birth: [birth year]

## 2018-07-22 ENCOUNTER — Ambulatory Visit: Payer: Federal, State, Local not specified - PPO | Attending: Orthopedic Surgery

## 2018-07-22 DIAGNOSIS — M25662 Stiffness of left knee, not elsewhere classified: Secondary | ICD-10-CM | POA: Diagnosis not present

## 2018-07-22 DIAGNOSIS — G8929 Other chronic pain: Secondary | ICD-10-CM

## 2018-07-22 DIAGNOSIS — M6281 Muscle weakness (generalized): Secondary | ICD-10-CM | POA: Diagnosis not present

## 2018-07-22 DIAGNOSIS — M25562 Pain in left knee: Secondary | ICD-10-CM | POA: Diagnosis not present

## 2018-07-22 DIAGNOSIS — R262 Difficulty in walking, not elsewhere classified: Secondary | ICD-10-CM | POA: Diagnosis not present

## 2018-07-22 NOTE — Therapy (Signed)
[facility] [address]  [place], [place], [address] Phone: [phone]   Fax:  [phone]  Physical Therapy Treatment  Patient Details  Name: Sheila [name] MRN: [id number] Date of Birth: [birth year] Referring Provider (PT): Gaynelle Arabian, MD   Encounter Date: 07/22/2018  PT End of Session - 07/22/18 1450    Visit Number  2    Number of Visits  9    Date for PT Re-Evaluation  08/06/18    Authorization Type  Federal [employer]    PT Start Time  5379    PT Stop Time  1535    PT Time Calculation (min)  48 min    Activity Tolerance  Patient tolerated treatment well;Patient limited by pain    Behavior During Therapy  [facility] for tasks assessed/performed       Past Medical History:  Diagnosis Date  . Anal fissure   . Anxiety   . Chronic interstitial cystitis   . Crohn disease ([place])   . Fibromyalgia   . Migraines   . Pneumonia 2007  . SLE (systemic lupus erythematosus) ([place])     Past Surgical History:  Procedure Laterality Date  . BLADDER SURGERY    . CESAREAN SECTION    . KNEE ARTHROSCOPY    . OOPHORECTOMY     right  . TONSILLECTOMY    . WRIST SURGERY      There were no vitals filed for this visit.  Subjective Assessment - 07/22/18 1448    Subjective  Pt. 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      Pertinent History  SLE, migraine, crohn's disease, chronic interstitial cystitis, chronic thoracic back pain, fibromyalgia    Patient Stated Goals  to avoid surgery    Currently in Pain?  Yes    Pain Score  7     Pain Location  Knee    Pain Orientation  Left;Lateral;Posterior   "Deep"   Pain Descriptors / Indicators  Sharp;Shooting;Throbbing    Pain Type  Chronic pain    Aggravating Factors   Prolonged standing, walking, prolonged sitting     Multiple Pain Sites  No         [facility] PT Assessment - 07/22/18 1500      Assessment   Medical Diagnosis  Pain  in L knee    Referring Provider (PT)  Gaynelle Arabian, MD    Onset Date/Surgical Date  01/07/18    Next MD Visit  11.14.19    Prior Therapy  Yes- knee      AROM   Right/Left Knee  Left    Left Knee Extension  1    Left Knee Flexion  90                   [facility] Adult PT Treatment/Exercise - 07/22/18 1518      Knee/Hip Exercises: Stretches   Passive Hamstring Stretch  Left;30 seconds;2 reps   Visible tightness   Passive Hamstring Stretch Limitations  strap     Quad Stretch  Left;1 rep;30 seconds   Visible tightness   Quad Stretch Limitations  with black bolster under thigh       Knee/Hip Exercises: Aerobic   Nustep  Lvl 1, 6 min       Knee/Hip Exercises: Supine   Bridges with Clamshell  Both;10 reps;1 set;Strengthening   With hip abd/ER isoemtrics into red TB at knees  Other Supine Knee/Hip Exercises  Hooklying adduction ball squeeze 5" x 10 reps     Other Supine Knee/Hip Exercises  L knee flexion stretch with strap 5" x 10 reps with heels resting on peanut p-ball       Knee/Hip Exercises: Sidelying   Hip ADduction  Left;2 sets;5 reps   pt. requiring rest after 5 reps thus two sets   Clams  L clam shell with red looped TB at knees x 10 reps       Manual Therapy   Manual Therapy  Taping    Kinesiotex  Create Space      Kinesiotix   Create Space  L knee chondromalacia taping pattern with 30% stretch on medial I-strip and 50% stretch on lateral I-strip for hopeful improvement in patellar tracking and increased support             PT Education - 07/22/18 1542    Education Details  HEP update    Person(s) Educated  Patient    Methods  Explanation;Verbal cues;Handout    Comprehension  Verbalized understanding;Returned demonstration;Verbal cues required;Need further instruction       PT Short Term Goals - 07/22/18 1451      PT SHORT TERM GOAL #1   Title  Patient to be independent with initial HEP.    Time  2    Period  Weeks    Status  Achieved         PT Long Term Goals - 07/22/18 1451      PT LONG TERM GOAL #1   Title  Patient to be independent with advanced HEP.    Time  4    Period  Weeks    Status  On-going      PT LONG TERM GOAL #2   Title  Patient to demonstrate [facility] and pain-free L knee AROM.    Time  4    Period  Weeks    Status  On-going      PT LONG TERM GOAL #3   Title  Patient to demonstrate >=4+/5 strength in B LEs.    Time  4    Period  Weeks    Status  On-going      PT LONG TERM GOAL #4   Title  Patient to report tolerance of 30 min of walking without pain limiting.     Time  4    Period  Weeks    Status  On-going      PT LONG TERM GOAL #5   Title  Patient to demonstrate squatting to lift object off ground with <3/10 pain.    Time  4    Period  Weeks    Status  On-going            Plan - 07/22/18 1451    Clinical Impression Statement  Pt. able to demo good improvement in L knee AROM today 1-90 dg nearly 10 dg improvement from last measured flexion.  Notes L lateral/inferior knee pain worst with prolonged standing and walking however improved from initial subjective report 7/10 to start session decreasing to 4/10 by end of session.  Pt. tolerated mild progression of proximal hip/LE strengthening activities well today.  Unable to tolerate hooklying adduction ball squeeze due to complaint of medial knee pain thus alternative groin strengthening provided to pt. with HEP updated.  Chondromalacia patella taping pattern trialed with pt. today with some noted improvement in comfort.  Will plan to monitor HEP adherence/tolerance and consider  further taping in coming visit.  Pt. to see MD for f/u on 07/30/18 thus will address goals in coming visit.      Clinical Impairments Affecting Rehab Potential  SLE, migraine, crohn's disease, chronic interstitial cystitis, chronic thoracic back pain, fibromyalgia    PT Treatment/Interventions  ADLs/Self Care Home Management;Cryotherapy;Electrical  Stimulation;Iontophoresis 79m/ml Dexamethasone;Moist Heat;Ultrasound;DME Instruction;Gait training;Stair training;Functional mobility training;Therapeutic activities;Therapeutic exercise;Manual techniques;Orthotic Fit/Training;Patient/family education;Neuromuscular re-education;Balance training;Scar mobilization;Passive range of motion;Dry needling;Energy conservation;Splinting;Taping;Vasopneumatic Device    PT Next Visit Plan  address goals for upcoming MD f/u on 11.14    Consulted and Agree with Plan of Care  Patient       Patient will benefit from skilled therapeutic intervention in order to improve the following deficits and impairments:  Decreased activity tolerance, Decreased strength, Pain, Difficulty walking, Decreased balance, Decreased range of motion, Postural dysfunction  Visit Diagnosis: Chronic pain of left knee  Stiffness of left knee, not elsewhere classified  Muscle weakness (generalized)  Difficulty in walking, not elsewhere classified     Problem List Patient Active Problem List   Diagnosis Date Noted  . Hoarseness, persistent 02/27/2018  . Reflux laryngitis 02/27/2018  . Rectal pain 03/05/2017  . H/O Crohn's disease 03/05/2017  . Bloating 03/05/2017  . Migraine without aura and without status migrainosus, not intractable 08/27/2016  . Depression with anxiety 08/04/2016  . Fibromyalgia 08/04/2016  . Migraines 07/11/2015  . Rash and nonspecific skin eruption 04/13/2015  . Memory loss of unknown cause 04/13/2015  . External hemorrhoid 02/25/2013  . Left knee pain 05/04/2012  . Anxiety state 11/26/2010  . DEPRESSIVE DISORDER 01/11/2009  . ABDOMINAL BLOATING 08/19/2008  . CROHN'S DISEASE 06/29/2008  . LYMPHOPENIA 11/19/2007  . HYPERTHYROIDISM 11/05/2007  . CERVICAL LYMPHADENOPATHY 11/03/2007  . CHRONIC INTERSTITIAL CYSTITIS 08/04/2007  . LUPUS ERYTHEMATOSUS 08/04/2007  . Fibromyalgia muscle pain 08/04/2007  . Chest pain 08/04/2007  . HEMATURIA, HX OF  08/04/2007    MBess Harvest PTA 07/22/18 3:55 PM   [facility] [address] [place] [address]Phone: [phone]  Fax:  [phone] Name: Sheila ROYCEMRN: [id number] of Birth: [birth year]

## 2018-07-24 ENCOUNTER — Ambulatory Visit: Payer: Federal, State, Local not specified - PPO | Admitting: Physical Therapy

## 2018-07-29 ENCOUNTER — Ambulatory Visit: Payer: Federal, State, Local not specified - PPO | Admitting: Physical Therapy

## 2018-07-29 ENCOUNTER — Encounter: Payer: Self-pay | Admitting: Physical Therapy

## 2018-07-29 DIAGNOSIS — M25562 Pain in left knee: Principal | ICD-10-CM

## 2018-07-29 DIAGNOSIS — M6281 Muscle weakness (generalized): Secondary | ICD-10-CM | POA: Diagnosis not present

## 2018-07-29 DIAGNOSIS — R262 Difficulty in walking, not elsewhere classified: Secondary | ICD-10-CM | POA: Diagnosis not present

## 2018-07-29 DIAGNOSIS — G8929 Other chronic pain: Secondary | ICD-10-CM | POA: Diagnosis not present

## 2018-07-29 DIAGNOSIS — M25662 Stiffness of left knee, not elsewhere classified: Secondary | ICD-10-CM

## 2018-07-29 NOTE — Therapy (Addendum)
[facility] [address]  [place], [place], [address] Phone: [phone]   Fax:  [phone]  Physical Therapy Treatment  Patient Details  Name: Sheila [name] MRN: [id number] Date of Birth: 01/22/1966 Referring Provider (PT): Gaynelle Arabian, MD   Encounter Date: 07/29/2018  PT End of Session - 07/29/18 1105    Visit Number  3    Number of Visits  9    Date for PT Re-Evaluation  08/06/18    Authorization Type  [employer]    PT Start Time  5027    PT Stop Time  1142   pt had to leave early for another appt.    PT Time Calculation (min)  39 min    Activity Tolerance  Patient tolerated treatment well    Behavior During Therapy  WFL for tasks assessed/performed       Past Medical History:  Diagnosis Date  . Anal fissure   . Anxiety   . Chronic interstitial cystitis   . Crohn disease ([place])   . Fibromyalgia   . Migraines   . Pneumonia 2007  . SLE (systemic lupus erythematosus) ([place])     Past Surgical History:  Procedure Laterality Date  . BLADDER SURGERY    . CESAREAN SECTION    . KNEE ARTHROSCOPY    . OOPHORECTOMY     right  . TONSILLECTOMY    . WRIST SURGERY      There were no vitals filed for this visit.  Subjective Assessment - 07/29/18 1105    Subjective  Pt reports her knee was loosened up after last session. By that night, her knee hurt worse.  Her hip hurt as well for a few days. She's done a few but mostly just walking around for work.   She tolerated tape well, without any skin reaction. She believes this helped some.  She would like to return to wearing compression sleeve on knee since it provided support and pain relief.      Currently in Pain?  Yes    Pain Score  7     Pain Location  Knee    Pain Orientation  Left    Aggravating Factors   prolonged standing, stairs     Pain Relieving Factors  elevation, ice          [facility] PT Assessment - 07/29/18 0001      Assessment   Medical  Diagnosis  Pain in L knee    Referring Provider (PT)  Gaynelle Arabian, MD    Onset Date/Surgical Date  01/07/18    Next MD Visit  11.14.19    Prior Therapy  Yes- knee      AROM   Right Knee Flexion  134    Left Knee Extension  0    Left Knee Flexion  122      Flexibility   Soft Tissue Assessment /Muscle Length  yes    Quadriceps  Lt 105 deg        [facility] Adult PT Treatment/Exercise - 07/29/18 0001      Self-Care   Self-Care  Other Self-Care Comments    Other Self-Care Comments   reviewed self massage with roller stick to quad, ITB and adductor to decrease fascial tightness; pt verbalized understanding      Exercises   Exercises  Knee/Hip      Knee/Hip Exercises: Stretches   Passive Hamstring Stretch  Left;2 reps;30 seconds    Passive Hamstring  Stretch Limitations  seated     Quad Stretch  Left;30 seconds;3 reps   seated x 2, prone x 3   ITB Stretch Limitations  Pt shown supine ITB stretch; encouraged pt to complete at home.     Gastroc Stretch  Left;1 rep;30 seconds    Other Knee/Hip Stretches  Pt shown supine adductor stretch; encouraged pt to complete at home.       Knee/Hip Exercises: Aerobic   Nustep  L2: 5 min for ROM      Knee/Hip Exercises: Supine   Quad Sets Limitations  1 rep to review technique for HEP     Heel Slides Limitations  1 rep to review technique for HEP       Modalities   Modalities  --   declined, will ice at home.      Manual Therapy   Manual therapy comments  I strip of reg KTtape applied wiht 15-20% stretch to medial knee crossing over towards fibular head, strip applied to lateral Lt knee crossing patellar tendon and ending on pes anserine; I strip applied perpendicular to Lt quad tendon - to decompress tissue, decrease pain and swelling.              PT Education - 07/29/18 1152    Education Details  KT tape info - rationale and brief self application guidelines.     Person(s) Educated  Patient    Methods  Explanation     Comprehension  Verbalized understanding       PT Short Term Goals - 07/22/18 1451      PT SHORT TERM GOAL #1   Title  Patient to be independent with initial HEP.    Time  2    Period  Weeks    Status  Achieved        PT Long Term Goals - 07/22/18 1451      PT LONG TERM GOAL #1   Title  Patient to be independent with advanced HEP.    Time  4    Period  Weeks    Status  On-going      PT LONG TERM GOAL #2   Title  Patient to demonstrate [facility] and pain-free L knee AROM.    Time  4    Period  Weeks    Status  On-going      PT LONG TERM GOAL #3   Title  Patient to demonstrate >=4+/5 strength in B LEs.    Time  4    Period  Weeks    Status  On-going      PT LONG TERM GOAL #4   Title  Patient to report tolerance of 30 min of walking without pain limiting.     Time  4    Period  Weeks    Status  On-going      PT LONG TERM GOAL #5   Title  Patient to demonstrate squatting to lift object off ground with <3/10 pain.    Time  4    Period  Weeks    Status  On-going            Plan - 07/29/18 1153    Clinical Impression Statement  Pt demonstrated improved Lt knee ROM compared to last visit.  Pt's Lt quad flexibility initially 105 deg, but after 4 prone stretches, improved to 120 deg.  Session limited due to patients request for shortened session. Pt reported pain reduction by 2 points to 5/10 by  end of session.  Pt progressing towards established goals.     Rehab Potential  Good    Clinical Impairments Affecting Rehab Potential  SLE, migraine, crohn's disease, chronic interstitial cystitis, chronic thoracic back pain, fibromyalgia    PT Frequency  2x / week    PT Duration  4 weeks    PT Treatment/Interventions  ADLs/Self Care Home Management;Cryotherapy;Electrical Stimulation;Iontophoresis 36m/ml Dexamethasone;Moist Heat;Ultrasound;DME Instruction;Gait training;Stair training;Functional mobility training;Therapeutic activities;Therapeutic exercise;Manual techniques;Orthotic  Fit/Training;Patient/family education;Neuromuscular re-education;Balance training;Scar mobilization;Passive range of motion;Dry needling;Energy conservation;Splinting;Taping;Vasopneumatic Device    PT Next Visit Plan  continue progressive ROM and strengthening for Lt knee.  Instruct pt on taping technique if still helpful.     Consulted and Agree with Plan of Care  Patient       Patient will benefit from skilled therapeutic intervention in order to improve the following deficits and impairments:  Decreased activity tolerance, Decreased strength, Pain, Difficulty walking, Decreased balance, Decreased range of motion, Postural dysfunction  Visit Diagnosis: Chronic pain of left knee  Stiffness of left knee, not elsewhere classified  Muscle weakness (generalized)     Problem List Patient Active Problem List   Diagnosis Date Noted  . Hoarseness, persistent 02/27/2018  . Reflux laryngitis 02/27/2018  . Rectal pain 03/05/2017  . H/O Crohn's disease 03/05/2017  . Bloating 03/05/2017  . Migraine without aura and without status migrainosus, not intractable 08/27/2016  . Depression with anxiety 08/04/2016  . Fibromyalgia 08/04/2016  . Migraines 07/11/2015  . Rash and nonspecific skin eruption 04/13/2015  . Memory loss of unknown cause 04/13/2015  . External hemorrhoid 02/25/2013  . Left knee pain 05/04/2012  . Anxiety state 11/26/2010  . DEPRESSIVE DISORDER 01/11/2009  . ABDOMINAL BLOATING 08/19/2008  . CROHN'S DISEASE 06/29/2008  . LYMPHOPENIA 11/19/2007  . HYPERTHYROIDISM 11/05/2007  . CERVICAL LYMPHADENOPATHY 11/03/2007  . CHRONIC INTERSTITIAL CYSTITIS 08/04/2007  . LUPUS ERYTHEMATOSUS 08/04/2007  . Fibromyalgia muscle pain 08/04/2007  . Chest pain 08/04/2007  . HEMATURIA, HX OF 08/04/2007   JKerin Perna PTA 07/29/18 12:00 PM  [facility] [place] [address]Phone: [phone]   Fax:  [phone] Name: Sheila BOLLMANMRN: [id number] of Birth: [birth year] PHYSICAL THERAPY DISCHARGE SUMMARY  Visits from Start of Care: 3  Current functional level related to goals / functional outcomes: Unable to assess; patient cancelled appointments d/t needing surgery   Remaining deficits: Unable to assess   Education / Equipment: HEP  Plan: Patient agrees to discharge.  Patient goals were not met. Patient is being discharged due to a change in medical status.  ?????     YJanene Harvey PT, DPT 08/26/18 8:29 AM

## 2018-07-30 DIAGNOSIS — M2242 Chondromalacia patellae, left knee: Secondary | ICD-10-CM | POA: Diagnosis not present

## 2018-07-31 ENCOUNTER — Encounter: Payer: Self-pay | Admitting: Physical Therapy

## 2018-08-04 ENCOUNTER — Ambulatory Visit: Payer: Federal, State, Local not specified - PPO | Admitting: Physical Therapy

## 2018-08-18 ENCOUNTER — Encounter: Payer: Self-pay | Admitting: Physical Therapy

## 2018-09-01 DIAGNOSIS — M2242 Chondromalacia patellae, left knee: Secondary | ICD-10-CM | POA: Diagnosis not present

## 2018-09-01 DIAGNOSIS — M6752 Plica syndrome, left knee: Secondary | ICD-10-CM | POA: Diagnosis not present

## 2018-09-01 DIAGNOSIS — G8918 Other acute postprocedural pain: Secondary | ICD-10-CM | POA: Diagnosis not present

## 2018-09-08 ENCOUNTER — Other Ambulatory Visit (HOSPITAL_COMMUNITY): Payer: Self-pay | Admitting: Orthopedic Surgery

## 2018-09-08 ENCOUNTER — Encounter (HOSPITAL_COMMUNITY): Payer: Self-pay

## 2018-09-08 DIAGNOSIS — M7989 Other specified soft tissue disorders: Principal | ICD-10-CM

## 2018-09-08 DIAGNOSIS — M79605 Pain in left leg: Secondary | ICD-10-CM

## 2018-09-08 DIAGNOSIS — M79604 Pain in right leg: Secondary | ICD-10-CM

## 2018-09-10 ENCOUNTER — Ambulatory Visit (HOSPITAL_COMMUNITY)
Admission: RE | Admit: 2018-09-10 | Discharge: 2018-09-10 | Disposition: A | Discharge status: Home / Self Care | Payer: Federal, State, Local not specified - PPO | Source: Ambulatory Visit | Attending: Cardiovascular Disease | Admitting: Cardiovascular Disease

## 2018-09-10 DIAGNOSIS — M79605 Pain in left leg: Secondary | ICD-10-CM | POA: Diagnosis not present

## 2018-09-10 DIAGNOSIS — M7989 Other specified soft tissue disorders: Secondary | ICD-10-CM | POA: Diagnosis not present

## 2018-10-07 ENCOUNTER — Emergency Department (HOSPITAL_BASED_OUTPATIENT_CLINIC_OR_DEPARTMENT_OTHER)
Admission: EM | Admit: 2018-10-07 | Discharge: 2018-10-07 | Disposition: A | Discharge status: Home / Self Care | Payer: Federal, State, Local not specified - PPO | Attending: Emergency Medicine | Admitting: Emergency Medicine

## 2018-10-07 ENCOUNTER — Emergency Department (HOSPITAL_BASED_OUTPATIENT_CLINIC_OR_DEPARTMENT_OTHER): Payer: Federal, State, Local not specified - PPO

## 2018-10-07 ENCOUNTER — Encounter (HOSPITAL_BASED_OUTPATIENT_CLINIC_OR_DEPARTMENT_OTHER): Payer: Self-pay

## 2018-10-07 ENCOUNTER — Ambulatory Visit: Payer: Self-pay | Admitting: Internal Medicine

## 2018-10-07 ENCOUNTER — Other Ambulatory Visit: Payer: Self-pay

## 2018-10-07 ENCOUNTER — Ambulatory Visit: Payer: Self-pay

## 2018-10-07 DIAGNOSIS — R10811 Right upper quadrant abdominal tenderness: Secondary | ICD-10-CM

## 2018-10-07 DIAGNOSIS — Z79899 Other long term (current) drug therapy: Secondary | ICD-10-CM | POA: Diagnosis not present

## 2018-10-07 DIAGNOSIS — K529 Noninfective gastroenteritis and colitis, unspecified: Secondary | ICD-10-CM | POA: Diagnosis not present

## 2018-10-07 DIAGNOSIS — E0590 Thyrotoxicosis, unspecified without thyrotoxic crisis or storm: Secondary | ICD-10-CM | POA: Diagnosis not present

## 2018-10-07 DIAGNOSIS — R1011 Right upper quadrant pain: Secondary | ICD-10-CM | POA: Diagnosis not present

## 2018-10-07 DIAGNOSIS — R109 Unspecified abdominal pain: Secondary | ICD-10-CM | POA: Diagnosis not present

## 2018-10-07 LAB — CBC WITH DIFFERENTIAL/PLATELET
Abs Immature Granulocytes: 0.12 10*3/uL — ABNORMAL HIGH (ref 0.00–0.07)
BASOS ABS: 0.1 10*3/uL (ref 0.0–0.1)
Basophils Relative: 1 %
EOS ABS: 0.3 10*3/uL (ref 0.0–0.5)
Eosinophils Relative: 2 %
HEMATOCRIT: 45.2 % (ref 36.0–46.0)
HEMOGLOBIN: 14.5 g/dL (ref 12.0–15.0)
Immature Granulocytes: 1 %
Lymphocytes Relative: 29 %
Lymphs Abs: 3.2 10*3/uL (ref 0.7–4.0)
MCH: 30.3 pg (ref 26.0–34.0)
MCHC: 32.1 g/dL (ref 30.0–36.0)
MCV: 94.6 fL (ref 80.0–100.0)
MONOS PCT: 9 %
Monocytes Absolute: 1 10*3/uL (ref 0.1–1.0)
NEUTROS ABS: 6.4 10*3/uL (ref 1.7–7.7)
NEUTROS PCT: 58 %
NRBC: 0 % (ref 0.0–0.2)
Platelets: 299 10*3/uL (ref 150–400)
RBC: 4.78 MIL/uL (ref 3.87–5.11)
RDW: 12.9 % (ref 11.5–15.5)
WBC: 11 10*3/uL — ABNORMAL HIGH (ref 4.0–10.5)

## 2018-10-07 LAB — LIPASE, BLOOD: Lipase: 42 U/L (ref 11–51)

## 2018-10-07 LAB — COMPREHENSIVE METABOLIC PANEL
ALT: 22 U/L (ref 0–44)
ANION GAP: 8 (ref 5–15)
AST: 30 U/L (ref 15–41)
Albumin: 4.2 g/dL (ref 3.5–5.0)
Alkaline Phosphatase: 73 U/L (ref 38–126)
BUN: 10 mg/dL (ref 6–20)
CALCIUM: 8.7 mg/dL — AB (ref 8.9–10.3)
CO2: 18 mmol/L — ABNORMAL LOW (ref 22–32)
Chloride: 108 mmol/L (ref 98–111)
Creatinine, Ser: 0.85 mg/dL (ref 0.44–1.00)
GFR calc non Af Amer: >60 mL/min (ref >60)
GLUCOSE: 105 mg/dL — AB (ref 70–99)
POTASSIUM: 3.6 mmol/L (ref 3.5–5.1)
Sodium: 134 mmol/L — ABNORMAL LOW (ref 135–145)
Total Bilirubin: 0.5 mg/dL (ref 0.3–1.2)
Total Protein: 7.6 g/dL (ref 6.5–8.1)

## 2018-10-07 LAB — URINALYSIS, ROUTINE W REFLEX MICROSCOPIC
BILIRUBIN URINE: NEGATIVE
Glucose, UA: NEGATIVE mg/dL
KETONES UR: NEGATIVE mg/dL
NITRITE: NEGATIVE
PROTEIN: NEGATIVE mg/dL
Specific Gravity, Urine: 1.015 (ref 1.005–1.030)
pH: 6 (ref 5.0–8.0)

## 2018-10-07 LAB — URINALYSIS, MICROSCOPIC (REFLEX)

## 2018-10-07 LAB — PREGNANCY, URINE: Preg Test, Ur: NEGATIVE

## 2018-10-07 MED ORDER — DICYCLOMINE HCL 20 MG PO TABS: 20.0000 mg | ORAL_TABLET | Freq: Two times a day (BID) | ORAL | 0 refills | Status: DC

## 2018-10-07 MED ORDER — IOPAMIDOL (ISOVUE-300) INJECTION 61%
100.0000 mL | Freq: Once | INTRAVENOUS | Status: AC | PRN
  Administered 2018-10-07: 80 mL via INTRAVENOUS

## 2018-10-07 MED ORDER — ONDANSETRON HCL 4 MG/2ML IJ SOLN
4.0000 mg | Freq: Once | INTRAMUSCULAR | Status: AC
  Administered 2018-10-07: 4 mg via INTRAVENOUS
  Filled 2018-10-07: qty 2

## 2018-10-07 MED ORDER — MORPHINE SULFATE (PF) 4 MG/ML IV SOLN
4.0000 mg | Freq: Once | INTRAVENOUS | Status: AC
  Administered 2018-10-07: 4 mg via INTRAVENOUS
  Filled 2018-10-07: qty 1

## 2018-10-07 MED ORDER — HYDROCODONE-ACETAMINOPHEN 5-325 MG PO TABS
2.0000 | ORAL_TABLET | Freq: Once | ORAL | Status: AC
  Administered 2018-10-07: 2 via ORAL
  Filled 2018-10-07: qty 2

## 2018-10-07 MED ORDER — SODIUM CHLORIDE 0.9 % IV BOLUS
1000.0000 mL | Freq: Once | INTRAVENOUS | Status: AC
  Administered 2018-10-07: 1000 mL via INTRAVENOUS

## 2018-10-07 MED ORDER — OXYCODONE-ACETAMINOPHEN 5-325 MG PO TABS: 1.0000 | ORAL_TABLET | Freq: Four times a day (QID) | ORAL | 0 refills | Status: DC | PRN

## 2018-10-07 MED ORDER — ONDANSETRON 4 MG PO TBDP: 4.0000 mg | ORAL_TABLET | Freq: Three times a day (TID) | ORAL | 0 refills | Status: DC | PRN

## 2018-10-07 NOTE — ED Notes (Signed)
Pt reports pain is worse and requests something else for it. EDP notified.

## 2018-10-07 NOTE — ED Notes (Signed)
ED Provider at bedside. 

## 2018-10-07 NOTE — Telephone Encounter (Signed)
Spoke w/ Pt- informed that she needs to go to ED based on her pain scale of 8-9 out 10, and indicates uncontrollable pain and a possible dx beyond that we can treat in primary care. Also, last lab pick up is at 4:15. She began crying saying her pain is uncontrollable- again recommended that she go to ED. States she has Lupus and is afraid to go to ED w/ flu and pneumonia around informed that she is susceptible to that here as well. She stated that she would have her son to come pick her up. Again I recommend not to leave and go to ED. Pt hung up.    Follow-up at 4:12pm- Pt checked into ED.

## 2018-10-07 NOTE — ED Triage Notes (Signed)
C/o right side abd pain x 3 days-worse since last night-pt to triage in w/c-shaking/crying-pt was dropped off- to triage in w/c-pt also states she fell 3 days but feels pain unrelated

## 2018-10-07 NOTE — ED Notes (Signed)
PT states understanding of care given, follow up care, and medication prescribed. PT ambulated from ED to car with a steady gait. 

## 2018-10-07 NOTE — Discharge Instructions (Signed)
There was evidence of colitis on the CT scan.  Hand washing: Wash your hands throughout the day, but especially before and after touching the face, using the restroom, sneezing, coughing, or touching surfaces that have been coughed or sneezed upon. Hydration: Symptoms will be intensified and complicated by dehydration. Dehydration can also extend the duration of symptoms. Drink plenty of fluids and get plenty of rest. You should be drinking at least half a liter of water an hour to stay hydrated. Electrolyte drinks (ex. Gatorade, Powerade, Pedialyte) are also encouraged. You should be drinking enough fluids to make your urine light yellow, almost clear. If this is not the case, you are not drinking enough water. Please note that some of the treatments indicated below will not be effective if you are not adequately hydrated. Diet: Please concentrate on hydration, however, you may introduce food slowly.  Start with a clear liquid diet, progressed to a full liquid diet, and then bland solids as you are able. Pain or fever: Ibuprofen, Naproxen, or Tylenol for pain or fever.  Antiinflammatory medications: Take 600 mg of ibuprofen every 6 hours or 440 mg (over the counter dose) to 500 mg (prescription dose) of naproxen every 12 hours for the next 3 days. After this time, these medications may be used as needed for pain. Take these medications with food to avoid upset stomach. Choose only one of these medications, do not take them together. Acetaminophen (generic for Tylenol): Should you continue to have additional pain while taking the ibuprofen or naproxen, you may add in acetaminophen as needed. Your daily total maximum amount of acetaminophen from all sources should be limited to 4069m/day for persons without liver problems, or 2007mday for those with liver problems. Percocet: May take Percocet (oxycodone-acetaminophen) as needed for severe pain.  Do not drive or perform other dangerous activities while  taking the Percocet.  Please note that each pill of Percocet contains 325 mg of acetaminophen (Tylenol) and the above dosage limits apply. Nausea/vomiting: Use the Zofran for nausea or vomiting. Bentyl: This medication is what is known as an antispasmodic and is intended to help reduce abdominal discomfort. Follow-up: Follow-up with the gastroenterologist on this matter. Return: Return should you develop a fever, bloody diarrhea, increased abdominal pain, uncontrolled vomiting, or any other major concerns.  For prescription assistance, may try using prescription discount sites or apps, such as goodrx.com

## 2018-10-07 NOTE — Telephone Encounter (Signed)
Patient called in with c/o "abdominal pain." She says "the pain started 3 days ago, but today is worse. It's hard to take a deep breath and movement makes it worse. The pain is on the right side below the rib cage. The pain is a strong 8-9." I asked about other symptoms, she denies. I advised patient she may need to go to the ED, she says "I can't go there and sit with all those sick people with my Lupus. I am susceptible to catching the least little thing and it progresses worse with me. Is it possible to be seen today? My son just got home from school and can drive me. I am literally 5-6 minutes from the office." Appointment scheduled today at 1600 with Dr. Larose Kells, care advice given, patient verbalized understanding.  Reason for Disposition . [1] MODERATE pain (e.g., interferes with normal activities) AND [2] pain comes and goes (cramps) AND [3] present > 24 hours  (Exception: pain with Vomiting or Diarrhea - see that Guideline)  Answer Assessment - Initial Assessment Questions 1. LOCATION: "Where does it hurt?"      Right side below rib cage 2. RADIATION: "Does the pain shoot anywhere else?" (e.g., chest, back)     Moves under rib cage and below and over to center  3. ONSET: "When did the pain begin?" (e.g., minutes, hours or days ago)      3 days ago 4. SUDDEN: "Gradual or sudden onset?"     Gradually worse today 5. PATTERN "Does the pain come and go, or is it constant?"    - If constant: "Is it getting better, staying the same, or worsening?"      (Note: Constant means the pain never goes away completely; most serious pain is constant and it progresses)     - If intermittent: "How long does it last?" "Do you have pain now?"     (Note: Intermittent means the pain goes away completely between bouts)     Constant 6. SEVERITY: "How bad is the pain?"  (e.g., Scale 1-10; mild, moderate, or severe)   - MILD (1-3): doesn't interfere with normal activities, abdomen soft and not tender to touch    -  MODERATE (4-7): interferes with normal activities or awakens from sleep, tender to touch    - SEVERE (8-10): excruciating pain, doubled over, unable to do any normal activities      8-9 7. RECURRENT SYMPTOM: "Have you ever had this type of abdominal pain before?" If so, ask: "When was the last time?" and "What happened that time?"      No 8. CAUSE: "What do you think is causing the abdominal pain?"     I don't know 9. RELIEVING/AGGRAVATING FACTORS: "What makes it better or worse?" (e.g., movement, antacids, bowel movement)     Taking deep breaths, movements make it worse; stretching out makes it better 10. OTHER SYMPTOMS: "Has there been any vomiting, diarrhea, constipation, or urine problems?"       No 11. PREGNANCY: "Is there any chance you are pregnant?" "When was your last menstrual period?"       No  Protocols used: ABDOMINAL PAIN - [facility]

## 2018-10-07 NOTE — ED Notes (Signed)
Patient transported to CT 

## 2018-10-07 NOTE — ED Provider Notes (Signed)
[place] [facility] EMERGENCY DEPARTMENT Provider Note   CSN: [id number] Arrival date & time: 10/07/18  1609     History   Chief Complaint Chief Complaint  Patient presents with  . Abdominal Pain    HPI Sheila [name] is a 53 y.o. female.  HPI   Sheila [name] is a 53 y.o. female, with a history of chronic interstitial cystitis, Crohn's disease, fibromyalgia, and lupus, presenting to the ED with abdominal pain beginning two days ago. Pain is RUQ and epigastric, sharp, constant, nonradiating, currently rated 9/10. Has worsened since onset. Has not had this issue before. Does not change with eating/drinking. Pain makes it harder to breathe.  Last food was around 2 pm today. Last BM was last night and was normal. Denies fever/chills, N/V/C/D, urinary symptoms, chest pain, shortness of breath, or any other complaints.   Past Medical History:  Diagnosis Date  . Anal fissure   . Anxiety   . Chronic interstitial cystitis   . Crohn disease ([place])   . Fibromyalgia   . Migraines   . Pneumonia 2007  . SLE (systemic lupus erythematosus) ([place])     Patient Active Problem List   Diagnosis Date Noted  . Hoarseness, persistent 02/27/2018  . Reflux laryngitis 02/27/2018  . Rectal pain 03/05/2017  . H/O Crohn's disease 03/05/2017  . Bloating 03/05/2017  . Migraine without aura and without status migrainosus, not intractable 08/27/2016  . Depression with anxiety 08/04/2016  . Fibromyalgia 08/04/2016  . Migraines 07/11/2015  . Rash and nonspecific skin eruption 04/13/2015  . Memory loss of unknown cause 04/13/2015  . External hemorrhoid 02/25/2013  . Left knee pain 05/04/2012  . Anxiety state 11/26/2010  . DEPRESSIVE DISORDER 01/11/2009  . ABDOMINAL BLOATING 08/19/2008  . CROHN'S DISEASE 06/29/2008  . LYMPHOPENIA 11/19/2007  . HYPERTHYROIDISM 11/05/2007  . CERVICAL LYMPHADENOPATHY 11/03/2007  . CHRONIC INTERSTITIAL CYSTITIS 08/04/2007  . LUPUS ERYTHEMATOSUS 08/04/2007  .  Fibromyalgia muscle pain 08/04/2007  . Chest pain 08/04/2007  . HEMATURIA, HX OF 08/04/2007    Past Surgical History:  Procedure Laterality Date  . BLADDER SURGERY    . CESAREAN SECTION    . KNEE ARTHROSCOPY    . OOPHORECTOMY     right  . TONSILLECTOMY    . WRIST SURGERY       OB History   No obstetric history on file.      Home Medications    Prior to Admission medications   Medication Sig Start Date End Date Taking? Authorizing Provider  ALPRAZolam Duanne Moron) 0.5 MG tablet TAKE 1 TABLET(0.5 MG) BY MOUTH THREE TIMES DAILY AS NEEDED 01/21/18   Carollee Herter, Alferd Apa, DO  dicyclomine (BENTYL) 20 MG tablet Take 1 tablet (20 mg total) by mouth 2 (two) times daily. 10/07/18   Joy, Shawn C, PA-C  famotidine (PEPCID) 20 MG tablet Take 1 tablet (20 mg total) by mouth 2 (two) times daily. Patient not taking: Reported on 07/09/2018 02/27/18   Lauraine Rinne, NP  levocetirizine (XYZAL) 5 MG tablet TAKE 1 TABLET(5 MG) BY MOUTH EVERY EVENING 05/29/18   Lowne Chase, Yvonne R, DO  OGESTREL 0.5-50 MG-MCG tablet Take 1 tablet by mouth daily. 03/03/14   [provider]  olopatadine (PATANOL) 0.1 % ophthalmic solution Place 1 drop into both eyes 2 (two) times daily. Patient not taking: Reported on 07/09/2018 12/31/17   Saguier, Percell Miller, PA-C  omeprazole (PRILOSEC) 20 MG capsule Take 1 capsule (20 mg total) by mouth daily. 02/27/18   Warner Mccreedy,  Vinson Moselle, NP  ondansetron (ZOFRAN ODT) 4 MG disintegrating tablet Take 1 tablet (4 mg total) by mouth every 8 (eight) hours as needed for nausea or vomiting. 10/07/18   Joy, Shawn C, PA-C  oxyCODONE-acetaminophen (PERCOCET/ROXICET) 5-325 MG tablet Take 1-2 tablets by mouth every 6 (six) hours as needed for severe pain. 10/07/18   Joy, Shawn C, PA-C  sertraline (ZOLOFT) 100 MG tablet Take 1 tablet (100 mg total) by mouth daily. 05/29/18   Ann Held, DO  tiZANidine (ZANAFLEX) 4 MG tablet 1 po bid prn 11/18/17   Carollee Herter, Yvonne R, DO  topiramate (TOPAMAX) 100  MG tablet TAKE 1 TABLET(100 MG) BY MOUTH DAILY 05/29/18   Carollee Herter, Alferd Apa, DO  traMADol (ULTRAM) 50 MG tablet TAKE 1 TO 2 TABLETS BY MOUTH EVERY 6 HOURS AS NEEDED FOR PAIN 02/04/18   Carollee Herter, Alferd Apa, DO  traZODone (DESYREL) 50 MG tablet 1-2 po qhs 11/18/17   Roma Schanz R, DO  triamcinolone cream (KENALOG) 0.1 % Apply 1 application topically 2 (two) times daily. 02/13/17   Carollee Herter, Alferd Apa, DO  WIXELA INHUB 250-50 MCG/DOSE AEPB INHALE 1 PUFF INTO THE LUNGS TWICE DAILY 05/26/18   Ann Held, DO    Family History Family History  Problem Relation Age of Onset  . Breast cancer Mother   . Lung cancer Father        Died in 14-Nov-2011  . Breast cancer Maternal Grandmother   . Breast cancer Maternal Aunt   . Prostate cancer Maternal Grandfather   . Irritable bowel syndrome Sister   . Colon cancer Neg Hx   . Colon polyps Neg Hx   . Esophageal cancer Neg Hx   . Kidney disease Neg Hx   . Gallbladder disease Neg Hx     Social History Social History   Tobacco Use  . Smoking status: Never Smoker  . Smokeless tobacco: Never Used  Substance Use Topics  . Alcohol use: No    Alcohol/week: 0.0 standard drinks  . Drug use: No     Allergies   Ampicillin; Cymbalta [duloxetine hcl]; Fentanyl citrate; and Penicillins   Review of Systems Review of Systems  Constitutional: Negative for chills, diaphoresis and fever.  Respiratory: Negative for cough and shortness of breath.   Cardiovascular: Negative for chest pain.  Gastrointestinal: Positive for abdominal pain. Negative for blood in stool, constipation, diarrhea, nausea and vomiting.  All other systems reviewed and are negative.    Physical Exam Updated Vital Signs BP (!) 145/85 (BP Location: Right Arm)   Pulse 92   Temp 98.7 F (37.1 C) (Oral)   Resp 20   Ht 5' 4"  (1.626 m)   Wt 61.2 kg   SpO2 98%   BMI 23.17 kg/m   Physical Exam Vitals signs and nursing note reviewed.  Constitutional:      General:  She is not in acute distress.    Appearance: She is well-developed. She is not diaphoretic.  HENT:     Head: Normocephalic and atraumatic.     Mouth/Throat:     Mouth: Mucous membranes are moist.     Pharynx: Oropharynx is clear.  Eyes:     Conjunctiva/sclera: Conjunctivae normal.  Neck:     Musculoskeletal: Neck supple.  Cardiovascular:     Rate and Rhythm: Normal rate and regular rhythm.     Pulses: Normal pulses.     Heart sounds: Normal heart sounds.  Pulmonary:  Effort: Pulmonary effort is normal. No respiratory distress.     Breath sounds: Normal breath sounds.  Abdominal:     Palpations: Abdomen is soft.     Tenderness: There is abdominal tenderness. There is no guarding.    Musculoskeletal:     Right lower leg: No edema.     Left lower leg: No edema.  Lymphadenopathy:     Cervical: No cervical adenopathy.  Skin:    General: Skin is warm and dry.  Neurological:     Mental Status: She is alert.  Psychiatric:        Mood and Affect: Mood and affect normal.        Speech: Speech normal.        Behavior: Behavior normal.      ED Treatments / Results  Labs (all labs ordered are listed, but only abnormal results are displayed) Labs Reviewed  URINALYSIS, ROUTINE W REFLEX MICROSCOPIC - Abnormal; Notable for the following components:      Result Value   Hgb urine dipstick SMALL (*)    Leukocytes, UA SMALL (*)    All other components within normal limits  COMPREHENSIVE METABOLIC PANEL - Abnormal; Notable for the following components:   Sodium 134 (*)    CO2 18 (*)    Glucose, Bld 105 (*)    Calcium 8.7 (*)    All other components within normal limits  CBC WITH DIFFERENTIAL/PLATELET - Abnormal; Notable for the following components:   WBC 11.0 (*)    Abs Immature Granulocytes 0.12 (*)    All other components within normal limits  URINALYSIS, MICROSCOPIC (REFLEX) - Abnormal; Notable for the following components:   Bacteria, UA FEW (*)    All other components  within normal limits  PREGNANCY, URINE  LIPASE, BLOOD    EKG None  Radiology Ct Abdomen Pelvis W Contrast  Result Date: 10/07/2018 CLINICAL DATA:  53 y/o F; right upper quadrant abdominal pain for 3 days. EXAM: CT ABDOMEN AND PELVIS WITH CONTRAST TECHNIQUE: Multidetector CT imaging of the abdomen and pelvis was performed using the standard protocol following bolus administration of intravenous contrast. CONTRAST:  42m ISOVUE-300 IOPAMIDOL (ISOVUE-300) INJECTION 61% COMPARISON:  10/07/2018 abdominal ultrasound. FINDINGS: Lower chest: No acute abnormality. Hepatobiliary: No focal liver abnormality is seen. No gallstones, gallbladder wall thickening, or biliary dilatation. Pancreas: Unremarkable. No pancreatic ductal dilatation or surrounding inflammatory changes. Spleen: Normal in size without focal abnormality. Adrenals/Urinary Tract: Adrenal glands are unremarkable. Kidneys are normal, without renal calculi, focal lesion, or hydronephrosis. Bladder is unremarkable. Stomach/Bowel: Faint pericolonic fat stranding and hyperemia of ascending colon and hepatic flexure probably representing underlying mild colitis. No findings of perforation or abscess. Fecalization of small bowel contents in the upper abdomen. Normal appendix. Vascular/Lymphatic: Aortic atherosclerosis. No enlarged abdominal or pelvic lymph nodes. Reproductive: Multiple uterine myoma measuring up to 16 mm. Other: No abdominal wall hernia or abnormality. No abdominopelvic ascites. Musculoskeletal: No fracture is seen. Mild lower lumbar spine facet arthrosis. IMPRESSION: 1. Faint pericolonic fat stranding and hyperemia of ascending colon and hepatic flexure probably representing underlying mild colitis. No findings of perforation or abscess. 2. Fecalization of small bowel contents in upper abdomen compatible with chronic dysmotility. 3. Multiple uterine myoma measuring up to 16 mm. 4. Aortic Atherosclerosis (ICD10-I70.0). Electronically Signed    By: LKristine GarbeM.D.   On: 10/07/2018 19:53   UK[place]Abdomen Limited Ruq  Result Date: 10/07/2018 CLINICAL DATA:  Epigastric and right upper quadrant pain x3 days. EXAM: ULTRASOUND ABDOMEN  LIMITED RIGHT UPPER QUADRANT COMPARISON:  None. FINDINGS: Gallbladder: No gallstones or wall thickening visualized. No sonographic Murphy sign noted by sonographer. Common bile duct: Diameter: 6 mm Liver: No focal lesion identified. Within normal limits in parenchymal echogenicity. Portal vein is patent on color Doppler imaging with normal direction of blood flow towards the liver. IMPRESSION: Unremarkable right upper quadrant abdominal ultrasound. No sonographic findings for the patient's pain. Electronically Signed   By: Ashley Royalty M.D.   On: 10/07/2018 18:08    Procedures Procedures (including critical care time)  Medications Ordered in ED Medications  ondansetron (ZOFRAN) injection 4 mg (4 mg Intravenous Given 10/07/18 1702)  sodium chloride 0.9 % bolus 1,000 mL (0 mLs Intravenous Stopped 10/07/18 1849)  morphine 4 MG/ML injection 4 mg (4 mg Intravenous Given 10/07/18 1704)  iopamidol (ISOVUE-300) 61 % injection 100 mL (80 mLs Intravenous Contrast Given 10/07/18 1924)  morphine 4 MG/ML injection 4 mg (4 mg Intravenous Given 10/07/18 1850)  HYDROcodone-acetaminophen (NORCO/VICODIN) 5-325 MG per tablet 2 tablet (2 tablets Oral Given 10/07/18 2057)     Initial Impression / Assessment and Plan / ED Course  I have reviewed the triage vital signs and the nursing notes.  Pertinent labs & imaging results that were available during my care of the patient were reviewed by me and considered in my medical decision making (see chart for details).  Clinical Course as of Oct 08 40  Wed Oct 07, 2018  2110 Spoke with Dr. Toney Reil, radiologist. No findings supporting ischemia.  Vessels are patent.  No atherosclerotic disease.  No evidence of small bowel obstruction on imaging.   [SJ]  2120 Discussed  imaging results with the patient.  She voices understanding and states she is ready to go home.   [SJ]    Clinical Course User Index [SJ] Joy, Shawn C, PA-C   Patient presents with abdominal pain without additional symptoms. Patient is nontoxic appearing, afebrile, not tachycardic, not tachypneic, not hypotensive, maintains excellent SPO2 on room air.  Very mild leukocytosis.  Evidence of colitis on CT.  Recommend close PCP/GI follow-up. The patient was given instructions for home care as well as return precautions. Patient voices understanding of these instructions, accepts the plan, and is comfortable with discharge.   Findings and plan of care discussed with Gareth Morgan, MD.   Vitals:   10/07/18 1623 10/07/18 1624 10/07/18 1834 10/07/18 2126  BP:  (!) 145/85 118/68 131/79  Pulse:  92 66 64  Resp:  20 18 18   Temp:  98.7 F (37.1 C)    TempSrc:  Oral    SpO2:  98% 95% 97%  Weight: 61.2 kg     Height: 5' 4"  (1.626 m)        Final Clinical Impressions(s) / ED Diagnoses   Final diagnoses:  RUQ abdominal tenderness  Right upper quadrant abdominal pain  Colitis    ED Discharge Orders         Ordered    oxyCODONE-acetaminophen (PERCOCET/ROXICET) 5-325 MG tablet  Every 6 hours PRN     10/07/18 2133    ondansetron (ZOFRAN ODT) 4 MG disintegrating tablet  Every 8 hours PRN     10/07/18 2133    dicyclomine (BENTYL) 20 MG tablet  2 times daily     10/07/18 2133           Layla Maw 10/08/18 0045    Gareth Morgan, MD 10/09/18 1943

## 2018-10-09 ENCOUNTER — Telehealth: Payer: Self-pay | Admitting: Gastroenterology

## 2018-10-09 NOTE — Telephone Encounter (Signed)
Left message on machine to call back  

## 2018-10-09 NOTE — Telephone Encounter (Signed)
Pt was at the Ed on 10/07/18, she was told that she needed to see Dr. Ardis Hughs asap. I made an appt for her on Monday with Anderson Malta but she wanted me to send this message so Dr. Ardis Hughs is aware and can check her records from the ED.

## 2018-10-09 NOTE — Telephone Encounter (Signed)
Dr Ardis Hughs will have the ED records at her office visit.

## 2018-10-13 ENCOUNTER — Other Ambulatory Visit (INDEPENDENT_AMBULATORY_CARE_PROVIDER_SITE_OTHER): Payer: Federal, State, Local not specified - PPO

## 2018-10-13 ENCOUNTER — Encounter: Payer: Self-pay | Admitting: Physician Assistant

## 2018-10-13 ENCOUNTER — Ambulatory Visit: Payer: Federal, State, Local not specified - PPO | Admitting: Physician Assistant

## 2018-10-13 ENCOUNTER — Telehealth: Payer: Self-pay

## 2018-10-13 VITALS — BP 108/66 | HR 74 | Ht 64.0 in | Wt 135.1 lb

## 2018-10-13 DIAGNOSIS — Z8719 Personal history of other diseases of the digestive system: Secondary | ICD-10-CM

## 2018-10-13 DIAGNOSIS — R197 Diarrhea, unspecified: Secondary | ICD-10-CM

## 2018-10-13 DIAGNOSIS — K921 Melena: Secondary | ICD-10-CM

## 2018-10-13 DIAGNOSIS — R935 Abnormal findings on diagnostic imaging of other abdominal regions, including retroperitoneum: Secondary | ICD-10-CM | POA: Diagnosis not present

## 2018-10-13 DIAGNOSIS — R1011 Right upper quadrant pain: Secondary | ICD-10-CM | POA: Diagnosis not present

## 2018-10-13 LAB — CBC WITH DIFFERENTIAL/PLATELET
Basophils Absolute: 0.1 10*3/uL (ref 0.0–0.1)
Basophils Relative: 1.1 % (ref 0.0–3.0)
Eosinophils Absolute: 0.2 10*3/uL (ref 0.0–0.7)
Eosinophils Relative: 2.2 % (ref 0.0–5.0)
HCT: 42.2 % (ref 36.0–46.0)
HEMOGLOBIN: 14.3 g/dL (ref 12.0–15.0)
Lymphocytes Relative: 33.4 % (ref 12.0–46.0)
Lymphs Abs: 2.9 10*3/uL (ref 0.7–4.0)
MCHC: 34 g/dL (ref 30.0–36.0)
MCV: 92.3 fl (ref 78.0–100.0)
MONO ABS: 0.8 10*3/uL (ref 0.1–1.0)
Monocytes Relative: 9.1 % (ref 3.0–12.0)
Neutro Abs: 4.7 10*3/uL (ref 1.4–7.7)
Neutrophils Relative %: 54.2 % (ref 43.0–77.0)
Platelets: 268 10*3/uL (ref 150.0–400.0)
RBC: 4.57 Mil/uL (ref 3.87–5.11)
RDW: 13.1 % (ref 11.5–15.5)
WBC: 8.6 10*3/uL (ref 4.0–10.5)

## 2018-10-13 LAB — IBC PANEL
IRON: 84 ug/dL (ref 42–145)
Saturation Ratios: 22.4 % (ref 20.0–50.0)
Transferrin: 268 mg/dL (ref 212.0–360.0)

## 2018-10-13 LAB — HIGH SENSITIVITY CRP: CRP, High Sensitivity: 12.26 mg/L — ABNORMAL HIGH (ref 0.000–5.000)

## 2018-10-13 LAB — SEDIMENTATION RATE: Sed Rate: 31 mm/hr — ABNORMAL HIGH (ref 0–30)

## 2018-10-13 LAB — FERRITIN: FERRITIN: 46.4 ng/mL (ref 10.0–291.0)

## 2018-10-13 MED ORDER — OMEPRAZOLE 40 MG PO CPDR: 40.0000 mg | DELAYED_RELEASE_CAPSULE | Freq: Two times a day (BID) | ORAL | 3 refills | Status: DC

## 2018-10-13 MED ORDER — DICYCLOMINE HCL 20 MG PO TABS: 20.0000 mg | ORAL_TABLET | Freq: Four times a day (QID) | ORAL | 1 refills | Status: DC

## 2018-10-13 NOTE — Telephone Encounter (Signed)
[facility] [address] [place], [place]  [address] Phone:  [phone]   Fax:  [phone]  October 13, 2018   Sheila [name] [address] [place] [address]   Dear Dr. Lake Bells,  The above patient has been scheduled for an EGD on Tuesday, 10/20/18.  Please give pulmonary clearance for this patient to have procedure.  If you have any questions or concerns, please don't hesitate to call.  Sincerely,    Levin Erp, [place]  [facility]

## 2018-10-13 NOTE — Telephone Encounter (Signed)
Spoke with patient this afternoon.  Pt aware okay to procedure from pulmonary standpoint.

## 2018-10-13 NOTE — Progress Notes (Signed)
Chief Complaint: Right upper quadrant abdominal pain  HPI:    Sheila [name] is a 53 year old Caucasian femalemale, known to Dr. Ardis Hughs, with a past medical history of chronic interstitial cystitis, Crohn's disease, fibromyalgia and lupus who presents to clinic today after being seen in the ER on 10/07/2018 with right upper quadrant abdominal pain.    10/06/2008 colonoscopy Dr. Ardis Hughs for history of Crohn's disease diagnosed in 2005 with biopsy-proven ileitis, colitis, which showed a normal colon, normal terminal ileum and no polyps or cancers.    03/05/2017 office visit with Alonza Bogus.  Patient was initially on Lialda, but was not on medication at that follow-up.    10/07/2018 ER visit described right upper quadrant and epigastric, sharp, constant nonradiating 9/10 pain.  Urinalysis was normal, CMP with a sodium of 134 and otherwise normal.  CBC with a white count minimally elevated at 11.  CT abdomen pelvis with faint pericolonic fat stranding and hyperemia of ascending colon hepatic flexure probably representing underlying mild colitis.  No findings of perforation or abscess.  Fecalization of small bowel contents in the upper abdomen compatible with chronic dysmotility.  Multiple uterine myoma measuring up to 16 mm.  Aortic atherosclerosis.  Right upper quadrant ultrasound was unremarkable.  She was prescribed Zofran, Bentyl and Oxycodone.    Today, the patient presents to clinic accompanied by her husband.  She explains that she was on medication for Crohn's initially but tells me this seemed to always interfere with one of her other chronic medical conditions, either interstitial cystitis or lupus and so she stopped medication for this.  Most recently on Sunday, 10/11/2018 patient started with acute 9/10 pain in her right upper quadrant.  This continued into Wednesday when she presented to the ER with evaluation as above.  Currently the pain has decreased to a 5-6/10 with patient reminds me she has a high  tolerance for pain given all of her medical conditions.  She has also had loose stool over this time.  Tells me the Dicyclomine she was using twice a day does help.  She has also been using NSAIDs.    Also today patient describes melena, the last time about 2 days ago, prior to this the patient tells me that she has seen it off and on for "forever".  She cannot quantify time or how often she sees these black stools but they are very frequent.  Per her husband she pops Advil multiple times on a daily basis and has done this for years for chronic pain issues.  Was on a PPI for some time during evaluation for lung issues but this was recently stopped.    Medical history positive for following with Dr. Lake Bells in regards to some dyspnea with an abnormal alpha-1 antitrypsin enzyme allele and impaired pulmonary function testing.  Diagnosed with vocal cord dysphonia and mild persistent asthma.  Per patient had to have pulmonary clearance from Dr. Lake Bells prior to her recent knee surgery.    Denies fever, chills, nausea or vomiting.     Past Medical History:  Diagnosis Date  . Anal fissure   . Anxiety   . Chronic interstitial cystitis   . Crohn disease ([place])   . Fibromyalgia   . Migraines   . Pneumonia 2007  . SLE (systemic lupus erythematosus) ([place])     Past Surgical History:  Procedure Laterality Date  . BLADDER SURGERY    . CESAREAN SECTION    . KNEE ARTHROSCOPY    . OOPHORECTOMY  right  . TONSILLECTOMY    . WRIST SURGERY      Current Outpatient Medications  Medication Sig Dispense Refill  . ALPRAZolam (XANAX) 0.5 MG tablet TAKE 1 TABLET(0.5 MG) BY MOUTH THREE TIMES DAILY AS NEEDED 90 tablet 0  . dicyclomine (BENTYL) 20 MG tablet Take 1 tablet (20 mg total) by mouth 2 (two) times daily. 20 tablet 0  . famotidine (PEPCID) 20 MG tablet Take 1 tablet (20 mg total) by mouth 2 (two) times daily. 30 tablet 0  . levocetirizine (XYZAL) 5 MG tablet TAKE 1 TABLET(5 MG) BY MOUTH EVERY EVENING 90  tablet 3  . OGESTREL 0.5-50 MG-MCG tablet Take 1 tablet by mouth daily.    Marland Kitchen olopatadine (PATANOL) 0.1 % ophthalmic solution Place 1 drop into both eyes 2 (two) times daily. 5 mL 0  . omeprazole (PRILOSEC) 20 MG capsule Take 1 capsule (20 mg total) by mouth daily. 30 capsule 4  . ondansetron (ZOFRAN ODT) 4 MG disintegrating tablet Take 1 tablet (4 mg total) by mouth every 8 (eight) hours as needed for nausea or vomiting. 20 tablet 0  . oxyCODONE-acetaminophen (PERCOCET/ROXICET) 5-325 MG tablet Take 1-2 tablets by mouth every 6 (six) hours as needed for severe pain. 15 tablet 0  . sertraline (ZOLOFT) 100 MG tablet Take 1 tablet (100 mg total) by mouth daily. 90 tablet 3  . tiZANidine (ZANAFLEX) 4 MG tablet 1 po bid prn 60 tablet 1  . topiramate (TOPAMAX) 100 MG tablet TAKE 1 TABLET(100 MG) BY MOUTH DAILY 90 tablet 3  . traMADol (ULTRAM) 50 MG tablet TAKE 1 TO 2 TABLETS BY MOUTH EVERY 6 HOURS AS NEEDED FOR PAIN 30 tablet 0  . traZODone (DESYREL) 50 MG tablet 1-2 po qhs 60 tablet 1  . triamcinolone cream (KENALOG) 0.1 % Apply 1 application topically 2 (two) times daily. 45 g 3  . WIXELA INHUB 250-50 MCG/DOSE AEPB INHALE 1 PUFF INTO THE LUNGS TWICE DAILY 60 each 5   No current facility-administered medications for this visit.     Allergies as of 10/13/2018 - Review Complete 10/13/2018  Allergen Reaction Noted  . Ampicillin    . Cymbalta [duloxetine hcl] Other (See Comments) 02/13/2017  . Fentanyl citrate    . Penicillins      Family History  Problem Relation Age of Onset  . Breast cancer Mother   . Lung cancer Father        Died in 12-06-2011  . Breast cancer Maternal Grandmother   . Breast cancer Maternal Aunt   . Prostate cancer Maternal Grandfather   . Irritable bowel syndrome Sister   . Colon cancer Neg Hx   . Colon polyps Neg Hx   . Esophageal cancer Neg Hx   . Kidney disease Neg Hx   . Gallbladder disease Neg Hx     Social History   Socioeconomic History  . Marital status:  Married    Spouse name: Not on file  . Number of children: 2  . Years of education: Not on file  . Highest education level: Not on file  Occupational History  . Occupation: Print production planner  Social Needs  . Financial resource strain: Not on file  . Food insecurity:    Worry: Not on file    Inability: Not on file  . Transportation needs:    Medical: Not on file    Non-medical: Not on file  Tobacco Use  . Smoking status: Never Smoker  . Smokeless tobacco: Never Used  Substance  and Sexual Activity  . Alcohol use: No    Alcohol/week: 0.0 standard drinks  . Drug use: No  . Sexual activity: Not on file  Lifestyle  . Physical activity:    Days per week: Not on file    Minutes per session: Not on file  . Stress: Not on file  Relationships  . Social connections:    Talks on phone: Not on file    Gets together: Not on file    Attends religious service: Not on file    Active member of club or organization: Not on file    Attends meetings of clubs or organizations: Not on file    Relationship status: Not on file  . Intimate partner violence:    Fear of current or ex partner: Not on file    Emotionally abused: Not on file    Physically abused: Not on file    Forced sexual activity: Not on file  Other Topics Concern  . Not on file  Social History Narrative  . Not on file    Review of Systems:    Constitutional: No weight loss, fever or chills Skin: No rash Cardiovascular: No chest pain Respiratory: No SOB  Gastrointestinal: See HPI and otherwise negative Genitourinary: No dysuria  Neurological: No headache, dizziness or syncope Musculoskeletal: No new muscle or joint pain Hematologic: No bleeding  Psychiatric: No history of depression or anxiety   Physical Exam:  Vital signs: BP 108/66   Pulse 74   Ht 5' 4"  (1.626 m)   Wt 135 lb 2 oz (61.3 kg)   BMI 23.19 kg/m   Constitutional:   Pleasant Caucasian female appears to be in mild distress, Well developed, Well  nourished, alert and cooperative Respiratory: Respirations even and unlabored. Lungs clear to auscultation bilaterally.   No wheezes, crackles, or rhonchi.  Cardiovascular: Normal S1, S2. No MRG. Regular rate and rhythm. No peripheral edema, cyanosis or pallor.  Gastrointestinal:  Soft, nondistended, Moderate ttp in RUQ and epigastrum with involuntary guarding. Normal bowel sounds. No appreciable masses or hepatomegaly. Psychiatric: Demonstrates good judgement and reason without abnormal affect or behaviors.  MOST RECENT LABS AND IMAGING: CBC    Component Value Date/Time   WBC 11.0 (H) 10/07/2018 1643   RBC 4.78 10/07/2018 1643   HGB 14.5 10/07/2018 1643   HCT 45.2 10/07/2018 1643   PLT 299 10/07/2018 1643   MCV 94.6 10/07/2018 1643   MCH 30.3 10/07/2018 1643   MCHC 32.1 10/07/2018 1643   RDW 12.9 10/07/2018 1643   LYMPHSABS 3.2 10/07/2018 1643   MONOABS 1.0 10/07/2018 1643   EOSABS 0.3 10/07/2018 1643   BASOSABS 0.1 10/07/2018 1643    CMP     Component Value Date/Time   NA 134 (L) 10/07/2018 1643   K 3.6 10/07/2018 1643   CL 108 10/07/2018 1643   CO2 18 (L) 10/07/2018 1643   GLUCOSE 105 (H) 10/07/2018 1643   BUN 10 10/07/2018 1643   CREATININE 0.85 10/07/2018 1643   CALCIUM 8.7 (L) 10/07/2018 1643   PROT 7.6 10/07/2018 1643   ALBUMIN 4.2 10/07/2018 1643   AST 30 10/07/2018 1643   ALT 22 10/07/2018 1643   ALKPHOS 73 10/07/2018 1643   BILITOT 0.5 10/07/2018 1643   GFRNONAA >60 10/07/2018 1643   GFRAA >60 10/07/2018 1643    Assessment: 1.  Melena: For "a long time" per the patient, recent hemoglobin was normal, will recheck today, history of strong NSAID use multiple times a day for years  2.  Right upper quadrant pain: Consider relation abnormal CT and possible Crohn's flare versus duodenal ulcer given NSAID use and melena 3.  Diarrhea: With abdominal pain recently 4.  Abnormal CT of the abdomen: Showing inflammation in the ascending colon, see HPI, this could also be  a likely source of right upper quadrant pain 5.  History of Crohn's disease: Last colon in 2010, not on maintenance medication; this could represent a flare of Crohn's  Plan: 1.  Patient has multiple medical problems currently.  We will start with her melena and right upper quadrant pain.  Ordered an EGD for this next week.  Did discuss risks, benefits, limitations and alternatives.  This was scheduled with Dr. Ardis Hughs in [facility]. 2.  Start the patient on Omeprazole 40 mg twice daily, 30-60 minutes before eating breakfast and dinner #60 with 3 refills. 3.  Reviewed antireflux diet and lifestyle modifications including avoidance of all NSAIDs. 4.  Ordered stool studies including GI path panel, C. difficile and calprotectin 5.  Ordered labs to include a repeat CBC, ESR and CRP 6.  We will request pulmonary clearance from Dr. Lake Bells given patient's recent breathing difficulties.  She tells me she had to have clearance for her recent knee surgery. 7.  Patient will eventually need a colonoscopy, pending results of stool studies and continued symptoms.  Discussed that this may be a flare of her Crohn's, but could be infectious cause. 8.  Refilled patient's Dicyclomine 10 mg, let her know that she can take this 4 times daily, 20-30 minutes before meals and at bedtime #120 with 1 refill 9.  Patient to follow in clinic with me in 2 weeks, this will be after time of her EGD when we can further discuss results of stool studies and hopefully a colonoscopy.  Sheila Newer, PA-C [facility] 10/13/2018, 2:11 PM  Cc: Sheila [name], Alferd Apa, *

## 2018-10-13 NOTE — Patient Instructions (Addendum)
If you are age 51 or older, your body mass index should be between 23-30. Your Body mass index is 23.19 kg/m. If this is out of the aforementioned range listed, please consider follow up with your Primary Care Provider.  If you are age 47 or younger, your body mass index should be between 19-25. Your Body mass index is 23.19 kg/m. If this is out of the aformentioned range listed, please consider follow up with your Primary Care Provider.   You have been scheduled for an endoscopy. Please follow written instructions given to you at your visit today. If you use inhalers (even only as needed), please bring them with you on the day of your procedure. Your physician has requested that you go to www.startemmi.com and enter the access code given to you at your visit today. This web site gives a general overview about your procedure. However, you should still follow specific instructions given to you by our office regarding your preparation for the procedure.  Your provider has requested that you go to the basement level for lab work before leaving today. Press "B" on the elevator. The lab is located at the first door on the left as you exit the elevator.  We have sent the following medications to your pharmacy for you to pick up at your convenience: Dicyclomine - increased to four times daily Omeprazole   See Dr. Lake Bells for pulmonary clearance.  Follow up with me on November 03, 2018 at 1:45 pm.  Thank you for choosing me and [facility].    Sheila Newer, PA-C

## 2018-10-13 NOTE — Telephone Encounter (Signed)
OK to proceed

## 2018-10-14 ENCOUNTER — Other Ambulatory Visit: Payer: Federal, State, Local not specified - PPO

## 2018-10-14 ENCOUNTER — Encounter: Payer: Self-pay | Admitting: Physician Assistant

## 2018-10-14 DIAGNOSIS — R1011 Right upper quadrant pain: Secondary | ICD-10-CM | POA: Diagnosis not present

## 2018-10-14 DIAGNOSIS — R935 Abnormal findings on diagnostic imaging of other abdominal regions, including retroperitoneum: Secondary | ICD-10-CM | POA: Diagnosis not present

## 2018-10-14 DIAGNOSIS — R197 Diarrhea, unspecified: Secondary | ICD-10-CM

## 2018-10-14 DIAGNOSIS — Z8719 Personal history of other diseases of the digestive system: Secondary | ICD-10-CM

## 2018-10-14 DIAGNOSIS — K921 Melena: Secondary | ICD-10-CM

## 2018-10-14 LAB — C.DIFFICILE TOXIN: C. Difficile Toxin A: NOT DETECTED

## 2018-10-14 NOTE — Progress Notes (Signed)
I agree with the above note, plan 

## 2018-10-20 ENCOUNTER — Ambulatory Visit (AMBULATORY_SURGERY_CENTER): Payer: Federal, State, Local not specified - PPO | Admitting: Gastroenterology

## 2018-10-20 ENCOUNTER — Encounter: Payer: Self-pay | Admitting: Gastroenterology

## 2018-10-20 VITALS — BP 103/64 | HR 69 | Temp 98.6°F | Resp 13 | Ht 64.0 in | Wt 135.0 lb

## 2018-10-20 DIAGNOSIS — R1011 Right upper quadrant pain: Secondary | ICD-10-CM | POA: Diagnosis not present

## 2018-10-20 DIAGNOSIS — K2990 Gastroduodenitis, unspecified, without bleeding: Secondary | ICD-10-CM

## 2018-10-20 DIAGNOSIS — K2980 Duodenitis without bleeding: Secondary | ICD-10-CM | POA: Diagnosis not present

## 2018-10-20 DIAGNOSIS — K921 Melena: Secondary | ICD-10-CM

## 2018-10-20 DIAGNOSIS — K2950 Unspecified chronic gastritis without bleeding: Secondary | ICD-10-CM | POA: Diagnosis not present

## 2018-10-20 DIAGNOSIS — K2970 Gastritis, unspecified, without bleeding: Secondary | ICD-10-CM | POA: Diagnosis not present

## 2018-10-20 MED ORDER — SODIUM CHLORIDE 0.9 % IV SOLN: 500.0000 mL | INTRAVENOUS | Status: DC

## 2018-10-20 NOTE — Progress Notes (Signed)
To PACU, VSS. Report to [email or web address] 

## 2018-10-20 NOTE — Patient Instructions (Signed)
**  Continue present medications.  Especially recently started twice daily omeprazole.  Avoid NSAID type OTC pain medicines (such as advil), these MAY be causing your problems.   YOU HAD AN ENDOSCOPIC PROCEDURE TODAY AT [facility]:   Refer to the procedure report that was given to you for any specific questions about what was found during the examination.  If the procedure report does not answer your questions, please call your gastroenterologist to clarify.  If you requested that your care partner not be given the details of your procedure findings, then the procedure report has been included in a sealed envelope for you to review at your convenience later.  YOU SHOULD EXPECT: Some feelings of bloating in the abdomen. Passage of more gas than usual.  Walking can help get rid of the air that was put into your GI tract during the procedure and reduce the bloating. If you had a lower endoscopy (such as a colonoscopy or flexible sigmoidoscopy) you may notice spotting of blood in your stool or on the toilet paper. If you underwent a bowel prep for your procedure, you may not have a normal bowel movement for a few days.  Please Note:  You might notice some irritation and congestion in your nose or some drainage.  This is from the oxygen used during your procedure.  There is no need for concern and it should clear up in a day or so.  SYMPTOMS TO REPORT IMMEDIATELY:   Following upper endoscopy (EGD)  Vomiting of blood or coffee ground material  New chest pain or pain under the shoulder blades  Painful or persistently difficult swallowing  New shortness of breath  Fever of 100F or higher  Black, tarry-looking stools  For urgent or emergent issues, a gastroenterologist can be reached at any hour by calling [phone].   DIET:  We do recommend a small meal at first, but then you may proceed to your regular diet.  Drink plenty of fluids but you should avoid alcoholic beverages for 24  hours.  ACTIVITY:  You should plan to take it easy for the rest of today and you should NOT DRIVE or use heavy machinery until tomorrow (because of the sedation medicines used during the test).    FOLLOW UP: Our staff will call the number listed on your records the next business day following your procedure to check on you and address any questions or concerns that you may have regarding the information given to you following your procedure. If we do not reach you, we will leave a message.  However, if you are feeling well and you are not experiencing any problems, there is no need to return our call.  We will assume that you have returned to your regular daily activities without incident.  If any biopsies were taken you will be contacted by phone or by letter within the next 1-3 weeks.  Please call us at [phone] if you have not heard about the biopsies in 3 weeks.    SIGNATURES/CONFIDENTIALITY: You and/or your care partner have signed paperwork which will be entered into your electronic medical record.  These signatures attest to the fact that that the information above on your After Visit Summary has been reviewed and is understood.  Full responsibility of the confidentiality of this discharge information lies with you and/or your care-partner.

## 2018-10-20 NOTE — Op Note (Signed)
[facility] Patient Name: Sheila [name] Procedure Date: 10/20/2018 7:56 AM MRN: [id number] Endoscopist: Milus Banister , MD Age: 53 Referring MD:  Date of Birth: 06-Aug-1966 Gender: Female Account #: [id number] Procedure:                Upper GI endoscopy Indications:              Abdominal pain in the right upper quadrant, dark                            stools (not anemic), h/o Crohn's not on meds for                            several years Medicines:                Monitored Anesthesia Care Procedure:                Pre-Anesthesia Assessment:                           - Prior to the procedure, a History and Physical                            was performed, and patient medications and                            allergies were reviewed. The patient's tolerance of                            previous anesthesia was also reviewed. The risks                            and benefits of the procedure and the sedation                            options and risks were discussed with the patient.                            All questions were answered, and informed consent                            was obtained. Prior Anticoagulants: The patient has                            taken no previous anticoagulant or antiplatelet                            agents. ASA Grade Assessment: III - A patient with                            severe systemic disease. After reviewing the risks                            and benefits, the patient was deemed in  satisfactory condition to undergo the procedure.                           After obtaining informed consent, the endoscope was                            passed under direct vision. Throughout the                            procedure, the patient's blood pressure, pulse, and                            oxygen saturations were monitored continuously. The                            Endoscope was introduced through the  mouth, and                            advanced to the second part of duodenum. The upper                            GI endoscopy was accomplished without difficulty.                            The patient tolerated the procedure well. Scope In: Scope Out: Findings:                 Mild inflammation characterized by erythema and                            friability was found in the gastric antrum.                            Biopsies were taken with a cold forceps for                            histology.                           Peptic appearing duodenitis, also biospied to check                            for upper tract IBD.                           The exam was otherwise without abnormality. Complications:            No immediate complications. Estimated blood loss:                            None. Estimated Blood Loss:     Estimated blood loss: none. Impression:               - Mild, non-specific gastritis. Biopsied.                           -  Peptic appearing duodenitis, biopsied.                           - The examination was otherwise normal. Recommendation:           - Patient has a contact number available for                            emergencies. The signs and symptoms of potential                            delayed complications were discussed with the                            patient. Return to normal activities tomorrow.                            Written discharge instructions were provided to the                            patient.                           - Resume previous diet.                           - Continue present medications. Especially recently                            started twice daily omeprazole. Avoid NSAID type                            OTC pain medicines (such as advil), these MAY be                            causing your problems.                           - Await pathology results. Milus Banister, MD 10/20/2018 8:22:31 AM This report  has been signed electronically.

## 2018-10-21 ENCOUNTER — Telehealth: Payer: Self-pay

## 2018-10-21 NOTE — Telephone Encounter (Signed)
  Follow up Call-  Call back number 10/20/2018  Post procedure Call Back phone  # [phone]  Permission to leave phone message Yes  Some recent data might be hidden     Left message on voicemail

## 2018-10-21 NOTE — Telephone Encounter (Signed)
  Follow up Call-  Call back number 10/20/2018  Post procedure Call Back phone  # [phone]  Permission to leave phone message Yes  Some recent data might be hidden     No answer

## 2018-10-23 LAB — GASTROINTESTINAL PATHOGEN PANEL PCR
C. difficile Tox A/B, PCR: NOT DETECTED
CRYPTOSPORIDIUM, PCR: NOT DETECTED
Campylobacter, PCR: NOT DETECTED
E coli (ETEC) LT/ST PCR: NOT DETECTED
E coli (STEC) stx1/stx2, PCR: NOT DETECTED
E coli 0157, PCR: NOT DETECTED
GIARDIA LAMBLIA, PCR: NOT DETECTED
Norovirus, PCR: NOT DETECTED
Rotavirus A, PCR: NOT DETECTED
Salmonella, PCR: NOT DETECTED
Shigella, PCR: NOT DETECTED

## 2018-10-23 LAB — CALPROTECTIN: Calprotectin: <15.6 mcg/g

## 2018-11-03 ENCOUNTER — Encounter: Payer: Self-pay | Admitting: Physician Assistant

## 2018-11-03 ENCOUNTER — Ambulatory Visit: Payer: Federal, State, Local not specified - PPO | Admitting: Physician Assistant

## 2018-11-03 VITALS — BP 110/70 | HR 78 | Ht 64.0 in | Wt 138.4 lb

## 2018-11-03 DIAGNOSIS — K921 Melena: Secondary | ICD-10-CM | POA: Diagnosis not present

## 2018-11-03 DIAGNOSIS — Z8719 Personal history of other diseases of the digestive system: Secondary | ICD-10-CM

## 2018-11-03 DIAGNOSIS — R1084 Generalized abdominal pain: Secondary | ICD-10-CM | POA: Diagnosis not present

## 2018-11-03 MED ORDER — PEG 3350-KCL-NABCB-NACL-NASULF 236 G PO SOLR: 4000.0000 mL | Freq: Once | ORAL | 0 refills | Status: AC

## 2018-11-03 NOTE — Patient Instructions (Addendum)
If you are age 51 or older, your body mass index should be between 23-30. Your Body mass index is 23.75 kg/m. If this is out of the aforementioned range listed, please consider follow up with your Primary Care Provider.  If you are age 37 or younger, your body mass index should be between 19-25. Your Body mass index is 23.75 kg/m. If this is out of the aformentioned range listed, please consider follow up with your Primary Care Provider.   You have been scheduled for a colonoscopy. Please follow written instructions given to you at your visit today.  Please pick up your prep supplies at the pharmacy within the next 1-3 days. If you use inhalers (even only as needed), please bring them with you on the day of your procedure. Your physician has requested that you go to www.startemmi.com and enter the access code given to you at your visit today. This web site gives a general overview about your procedure. However, you should still follow specific instructions given to you by our office regarding your preparation for the procedure.  We have sent the following medications to your pharmacy for you to pick up at your convenience: Golytely  Start Miralax once daily.  Can use up to 4 times daily.  Continue Omeprazole twice daily.   Thank you for choosing me and [facility].    Ellouise Newer, PA-C

## 2018-11-03 NOTE — Progress Notes (Signed)
Chief Complaint: Follow-up melena and right upper quadrant pain, history of Crohn's  HPI:    [name] is a 53 year old Caucasian female, known to Dr. Ardis [name], with a past medical history of chronic interstitial cystitis, Crohn's disease, fibromyalgia and lupus, who presents to clinic today for follow-up of her melena and right upper quadrant pain.    10/13/2018 patient was seen in the office after being seen in the ER for right upper quadrant pain.  Her previous colonoscopy in 2010 for history of Crohn's disease was discussed which showed a normal colon, previously in 2005 this is showed biopsy-proven ileitis and colitis.  At that time patient had recently been in the ER with a CT abdomen pelvis with faint pericolonic fat stranding and hyperemia of the ascending colon hepatic flexure probably representing underlying mild colitis.  At that time described being on medication for her Crohn's felt this always interfered with other medication she was on for interstitial cystitis or lupus.  Described right upper quadrant pain 9/10 going to the ER, also described melena off and on for "ever" had a strong NSAID history with Advil multiple times a day on a daily basis for years.  At that time ordered an EGD for the next week given melena and right upper quadrant pain.  Started her on omeprazole 40 twice daily and order stool studies including GI path panel, C. difficile and calprotectin.  Also labs including CBC, ESR and CRP.  Was discussed that she would eventually need a colonoscopy with pending results of stool studies and continued symptoms.  Discussed that this may be a flare of her Crohn's versus infectious cause.  Refilled dicyclomine 10 mg 4 times daily as needed.  Is recommended she follow-up in clinic in a couple of weeks and we could discuss colonoscopy and stool study results.    10/13/2018 iron studies and CBC were normal.  ESR is elevated at 31 previously 11 three years ago.  CRP elevated at 12.26.  10/14/2018 GI path panel, calprotectin and C. difficile returned negative.    10/20/2018 EGD with mild nonspecific gastritis, peptic appearing duodenitis and otherwise normal.  Is recommended she stay on twice daily Omeprazole and avoid NSAIDs.  Biopsy showed mild chronic gastritis and peptic duodenitis.    Today, the patient presents to clinic and explains that she continues to have bright red bloody and sometimes loose bowel movements.  Describes that her pain is now 5-6/10 and is slightly less severe than when she was seen last.  She was using Dicyclomine 4 times a day and feels as though this made things worse because it started to stop her up, this made her feel bloated with a lot of belching and gas.  She has since decreased to 1-2 times a day and feels slightly better but now describes feeling like she is never empty.  Continues to be uncomfortable with sitting and standing more comfortable laying down.  This is "sucking the life out of me".    Patient has thrown away all the Advil in her house and has continued Omeprazole 40 twice daily.    Denies fever, chills, weight loss, anorexia, nausea, vomiting, heartburn or reflux.     Past Medical History:  Diagnosis Date  . Anal fissure   . Anxiety   . Chronic interstitial cystitis   . Crohn disease ([place])   . Fibromyalgia   . Migraines   . Pneumonia 2007  . SLE (systemic lupus erythematosus) ([facility])     Past Surgical  History:  Procedure Laterality Date  . BLADDER SURGERY    . CESAREAN SECTION    . KNEE ARTHROSCOPY    . OOPHORECTOMY     right  . TONSILLECTOMY    . WRIST SURGERY      Current Outpatient Medications  Medication Sig Dispense Refill  . ALPRAZolam (XANAX) 0.5 MG tablet TAKE 1 TABLET(0.5 MG) BY MOUTH THREE TIMES DAILY AS NEEDED 90 tablet 0  . dicyclomine (BENTYL) 20 MG tablet Take 1 tablet (20 mg total) by mouth 4 (four) times daily. Take 20-30 minutes before meals and bedtime 120 tablet 1  . levocetirizine (XYZAL) 5 MG tablet  TAKE 1 TABLET(5 MG) BY MOUTH EVERY EVENING 90 tablet 3  . OGESTREL 0.5-50 MG-MCG tablet Take 1 tablet by mouth daily.    Marland Kitchen omeprazole (PRILOSEC) 40 MG capsule Take 1 capsule (40 mg total) by mouth 2 (two) times daily. Take 30-60 minutes before breakfast and dinner. 60 capsule 3  . sertraline (ZOLOFT) 100 MG tablet Take 1 tablet (100 mg total) by mouth daily. 90 tablet 3  . tiZANidine (ZANAFLEX) 4 MG tablet 1 po bid prn 60 tablet 1  . topiramate (TOPAMAX) 100 MG tablet TAKE 1 TABLET(100 MG) BY MOUTH DAILY 90 tablet 3  . traMADol (ULTRAM) 50 MG tablet TAKE 1 TO 2 TABLETS BY MOUTH EVERY 6 HOURS AS NEEDED FOR PAIN (Patient not taking: Reported on 10/20/2018) 30 tablet 0  . traZODone (DESYREL) 50 MG tablet 1-2 po qhs 60 tablet 1  . triamcinolone cream (KENALOG) 0.1 % Apply 1 application topically 2 (two) times daily. (Patient not taking: Reported on 10/20/2018) 45 g 3  . WIXELA INHUB 250-50 MCG/DOSE AEPB INHALE 1 PUFF INTO THE LUNGS TWICE DAILY 60 each 5   No current facility-administered medications for this visit.     Allergies as of 11/03/2018 - Review Complete 10/20/2018  Allergen Reaction Noted  . Ampicillin    . Cymbalta [duloxetine hcl] Other (See Comments) 02/13/2017  . Fentanyl citrate    . Penicillins      Family History  Problem Relation Age of Onset  . Breast cancer Mother   . Lung cancer Father        Died in 12-01-11  . Breast cancer Maternal Grandmother   . Breast cancer Maternal Aunt   . Prostate cancer Maternal Grandfather   . Irritable bowel syndrome Sister   . Colon cancer Neg Hx   . Colon polyps Neg Hx   . Esophageal cancer Neg Hx   . Kidney disease Neg Hx   . Gallbladder disease Neg Hx   . Rectal cancer Neg Hx   . Stomach cancer Neg Hx     Social History   Socioeconomic History  . Marital status: Married    Spouse name: Not on file  . Number of children: 2  . Years of education: Not on file  . Highest education level: Not on file  Occupational History  .  Occupation: Print production planner  Social Needs  . Financial resource strain: Not on file  . Food insecurity:    Worry: Not on file    Inability: Not on file  . Transportation needs:    Medical: Not on file    Non-medical: Not on file  Tobacco Use  . Smoking status: Never Smoker  . Smokeless tobacco: Never Used  Substance and Sexual Activity  . Alcohol use: No    Alcohol/week: 0.0 standard drinks  . Drug use: No  . Sexual activity:  Not on file  Lifestyle  . Physical activity:    Days per week: Not on file    Minutes per session: Not on file  . Stress: Not on file  Relationships  . Social connections:    Talks on phone: Not on file    Gets together: Not on file    Attends religious service: Not on file    Active member of club or organization: Not on file    Attends meetings of clubs or organizations: Not on file    Relationship status: Not on file  . Intimate partner violence:    Fear of current or ex partner: Not on file    Emotionally abused: Not on file    Physically abused: Not on file    Forced sexual activity: Not on file  Other Topics Concern  . Not on file  Social History Narrative  . Not on file    Review of Systems:    Constitutional: No weight loss, fever or chills Skin: No rash  Cardiovascular: No chest pain Respiratory: No SOB  Gastrointestinal: See HPI and otherwise negative Genitourinary: No dysuria Neurological: No headache, dizziness or syncope Musculoskeletal: No new muscle or joint pain Hematologic: No bruising Psychiatric: No history of depression or anxiety   Physical Exam:  Vital signs: BP 110/70   Pulse 78   Ht 5' 4"  (1.626 m)   Wt 138 lb 6 oz (62.8 kg)   BMI 23.75 kg/m   Constitutional:   Pleasant Caucasian female appears to be in NAD, Well developed, Well nourished, alert and cooperative Respiratory: Respirations even and unlabored. Lungs clear to auscultation bilaterally.   No wheezes, crackles, or rhonchi.  Cardiovascular: Normal  S1, S2. No MRG. Regular rate and rhythm. No peripheral edema, cyanosis or pallor.  Gastrointestinal:  Soft, nondistended,moderate generalized ttp, No rebound or guarding. Normal bowel sounds. No appreciable masses or hepatomegaly. Rectal:  Not performed.  Psychiatric: Demonstrates good judgement and reason without abnormal affect or behaviors.  RELEVANT LABS AND IMAGING: CBC    Component Value Date/Time   WBC 8.6 10/13/2018 1510   RBC 4.57 10/13/2018 1510   HGB 14.3 10/13/2018 1510   HCT 42.2 10/13/2018 1510   PLT 268.0 10/13/2018 1510   MCV 92.3 10/13/2018 1510   MCH 30.3 10/07/2018 1643   MCHC 34.0 10/13/2018 1510   RDW 13.1 10/13/2018 1510   LYMPHSABS 2.9 10/13/2018 1510   MONOABS 0.8 10/13/2018 1510   EOSABS 0.2 10/13/2018 1510   BASOSABS 0.1 10/13/2018 1510    CMP     Component Value Date/Time   NA 134 (L) 10/07/2018 1643   K 3.6 10/07/2018 1643   CL 108 10/07/2018 1643   CO2 18 (L) 10/07/2018 1643   GLUCOSE 105 (H) 10/07/2018 1643   BUN 10 10/07/2018 1643   CREATININE 0.85 10/07/2018 1643   CALCIUM 8.7 (L) 10/07/2018 1643   PROT 7.6 10/07/2018 1643   ALBUMIN 4.2 10/07/2018 1643   AST 30 10/07/2018 1643   ALT 22 10/07/2018 1643   ALKPHOS 73 10/07/2018 1643   BILITOT 0.5 10/07/2018 1643   GFRNONAA >60 10/07/2018 1643   GFRAA >60 10/07/2018 1643    Assessment: 1.  Abdominal pain: Likely with Crohn's flare 2.  Hematochezia: Likely with a Crohn's flare, recent EGD, see HPI 3.  History of Crohn's disease: Has not been on maintenance medication, ESR and CRP are elevated, likely related to a Crohn's flare, patient tells me being on maintenance medicine for this disease interferes  with her other autoimmune processes, hence why she stopped and does not want to be restarted until necessary  Plan: 1.  Scheduled patient for colonoscopy with Dr. Ardis [name] on Friday.  Did discuss risks, benefits, limitations and alternatives the patient agrees to proceed. 2.  Discussed with  patient that she can use MiraLAX up to 4 times a day for current constipation. 3.  If the patient does not feel Dicyclomine is helping she can discontinue altogether or use as needed.  This is likely contributing to some of her constipation. 4.  Continue Omeprazole as prescribed 5.  Discussed with patient likely this is a flare of her Crohn's.  Again discussed that she could benefit from Prednisone or other medicine at this moment but she would like to make sure of what is going on before starting anything.  Also reminds me that when she is on any medication for her Crohn's it seems to interfere with her other autoimmune disorders including lupus and interstitial cystitis.  If she is placed on a medicine for her IBD it will need to be with these other processes in mind.  We will wait until after colonoscopy on Friday and Dr. Ardis [name] can advise her as to further recommendations. 6.  Patient to follow in clinic per recommendations from Dr. Ardis [name] after time of procedure.  Sheila Newer, PA-C [facility] 11/03/2018, 1:38 PM  Cc: Sheila [name], Alferd Apa, *

## 2018-11-04 NOTE — Progress Notes (Signed)
I agree with the above note, plan 

## 2018-11-06 ENCOUNTER — Encounter: Payer: Self-pay | Admitting: Gastroenterology

## 2018-11-06 ENCOUNTER — Ambulatory Visit (AMBULATORY_SURGERY_CENTER): Payer: Federal, State, Local not specified - PPO | Admitting: Gastroenterology

## 2018-11-06 VITALS — BP 107/69 | HR 73 | Temp 98.4°F | Resp 16 | Ht 64.0 in | Wt 138.0 lb

## 2018-11-06 DIAGNOSIS — D122 Benign neoplasm of ascending colon: Secondary | ICD-10-CM

## 2018-11-06 DIAGNOSIS — D123 Benign neoplasm of transverse colon: Secondary | ICD-10-CM | POA: Diagnosis not present

## 2018-11-06 DIAGNOSIS — R933 Abnormal findings on diagnostic imaging of other parts of digestive tract: Secondary | ICD-10-CM | POA: Diagnosis not present

## 2018-11-06 DIAGNOSIS — K921 Melena: Secondary | ICD-10-CM

## 2018-11-06 DIAGNOSIS — D120 Benign neoplasm of cecum: Secondary | ICD-10-CM

## 2018-11-06 DIAGNOSIS — R1084 Generalized abdominal pain: Secondary | ICD-10-CM

## 2018-11-06 MED ORDER — SODIUM CHLORIDE 0.9 % IV SOLN: 500.0000 mL | Freq: Once | INTRAVENOUS | Status: DC

## 2018-11-06 NOTE — Op Note (Signed)
[place] [facility] [place] Patient Name: Sheila [name] Procedure Date: 11/06/2018 3:00 PM MRN: [id number] Endoscopist: Milus Banister , MD Age: 53 Referring MD:  Date of Birth: 08-Jun-1966 Gender: Female Account #: [id number] Procedure:                Colonoscopy Indications:              Generalized abdominal pain, history of Crohn's                            ileocolitis (Dr. Sharlett Iles), elevated inflammatory                            markers, CT 09/2018 read as "Faint pericolonic fat                            stranding and hyperemia of ascending colon                           and hepatic flexure probably representing                            underlying mild colitis" Medicines:                Monitored Anesthesia Care Procedure:                Pre-Anesthesia Assessment:                           - Prior to the procedure, a History and Physical                            was performed, and patient medications and                            allergies were reviewed. The patient's tolerance of                            previous anesthesia was also reviewed. The risks                            and benefits of the procedure and the sedation                            options and risks were discussed with the patient.                            All questions were answered, and informed consent                            was obtained. Prior Anticoagulants: The patient has                            taken no previous anticoagulant or antiplatelet  agents. ASA Grade Assessment: II - A patient with                            mild systemic disease. After reviewing the risks                            and benefits, the patient was deemed in                            satisfactory condition to undergo the procedure.                           After obtaining informed consent, the colonoscope                            was passed under direct vision. Throughout the                       procedure, the patient's blood pressure, pulse, and                            oxygen saturations were monitored continuously. The                            Colonoscope was introduced through the anus and                            advanced to the the terminal ileum. The colonoscopy                            was performed without difficulty. The patient                            tolerated the procedure well. The quality of the                            bowel preparation was excellent. The terminal                            ileum, ileocecal valve, appendiceal orifice, and                            rectum were photographed. Scope In: 3:09:17 PM Scope Out: 3:26:41 PM Scope Withdrawal Time: 0 hours 14 minutes 6 seconds  Total Procedure Duration: 0 hours 17 minutes 24 seconds  Findings:                 The terminal ileum appeared normal. Biopsies were                            taken with a cold forceps for histology.                           Five sessile polyps were found in the transverse  colon, cecum and appendiceal orifice. The polyps                            were 3 to 4 mm in size. These polyps were removed                            with a cold snare. Resection and retrieval were                            complete.                           The exam was otherwise without abnormality on                            direct and retroflexion views. Complications:            No immediate complications. Estimated blood loss:                            None. Estimated Blood Loss:     Estimated blood loss: none. Impression:               - The examined portion of the ileum was normal.                            Biopsied. jar 1                           - Five 3 to 4 mm polyps in the transverse colon, in                            the cecum and at the appendiceal orifice, removed                            with a cold snare. Resected and  retrieved. jar 2                           - The colonic mucosa was possibly slightly granular                            throughout and was randomly biospied. jar 3.                           - The examination was otherwise normal on direct                            and retroflexion views. Recommendation:           - Patient has a contact number available for                            emergencies. The signs and symptoms of potential  delayed complications were discussed with the                            patient. Return to normal activities tomorrow.                            Written discharge instructions were provided to the                            patient.                           - Resume previous diet.                           - Continue present medications.                           - Await pathology results. Milus Banister, MD 11/06/2018 3:36:40 PM This report has been signed electronically.

## 2018-11-06 NOTE — Progress Notes (Signed)
Pt's states no medical or surgical changes since previsit or office visit. 

## 2018-11-06 NOTE — Progress Notes (Signed)
Report to PACU, RN, vss, BBS= Clear.  

## 2018-11-06 NOTE — Patient Instructions (Signed)
Handout on polyps given   YOU HAD AN ENDOSCOPIC PROCEDURE TODAY AT [place]:   Refer to the procedure report that was given to you for any specific questions about what was found during the examination.  If the procedure report does not answer your questions, please call your gastroenterologist to clarify.  If you requested that your care partner not be given the details of your procedure findings, then the procedure report has been included in a sealed envelope for you to review at your convenience later.  YOU SHOULD EXPECT: Some feelings of bloating in the abdomen. Passage of more gas than usual.  Walking can help get rid of the air that was put into your GI tract during the procedure and reduce the bloating. If you had a lower endoscopy (such as a colonoscopy or flexible sigmoidoscopy) you may notice spotting of blood in your stool or on the toilet paper. If you underwent a bowel prep for your procedure, you may not have a normal bowel movement for a few days.  Please Note:  You might notice some irritation and congestion in your nose or some drainage.  This is from the oxygen used during your procedure.  There is no need for concern and it should clear up in a day or so.  SYMPTOMS TO REPORT IMMEDIATELY:   Following lower endoscopy (colonoscopy or flexible sigmoidoscopy):  Excessive amounts of blood in the stool  Significant tenderness or worsening of abdominal pains  Swelling of the abdomen that is new, acute  Fever of 100F or higher   For urgent or emergent issues, a gastroenterologist can be reached at any hour by calling [phone].   DIET:  We do recommend a small meal at first, but then you may proceed to your regular diet.  Drink plenty of fluids but you should avoid alcoholic beverages for 24 hours.  ACTIVITY:  You should plan to take it easy for the rest of today and you should NOT DRIVE or use heavy machinery until tomorrow (because of the sedation  medicines used during the test).    FOLLOW UP: Our staff will call the number listed on your records the next business day following your procedure to check on you and address any questions or concerns that you may have regarding the information given to you following your procedure. If we do not reach you, we will leave a message.  However, if you are feeling well and you are not experiencing any problems, there is no need to return our call.  We will assume that you have returned to your regular daily activities without incident.  If any biopsies were taken you will be contacted by phone or by letter within the next 1-3 weeks.  Please call us at [phone] if you have not heard about the biopsies in 3 weeks.    SIGNATURES/CONFIDENTIALITY: You and/or your care partner have signed paperwork which will be entered into your electronic medical record.  These signatures attest to the fact that that the information above on your After Visit Summary has been reviewed and is understood.  Full responsibility of the confidentiality of this discharge information lies with you and/or your care-partner.

## 2018-11-06 NOTE — Progress Notes (Signed)
Called to room to assist during endoscopic procedure.  Patient ID and intended procedure confirmed with present staff. Received instructions for my participation in the procedure from the performing physician.  

## 2018-11-09 ENCOUNTER — Telehealth: Payer: Self-pay

## 2018-11-09 NOTE — Telephone Encounter (Signed)
  Follow up Call-  Call back number 11/06/2018 10/20/2018  Post procedure Call Back phone  # [phone]  Permission to leave phone message Yes Yes  Some recent data might be hidden     Patient questions:  Do you have a fever, pain , or abdominal swelling? No. Pain Score  0 *  Have you tolerated food without any problems? No.  Have you been able to return to your normal activities? No.  Do you have any questions about your discharge instructions: Diet   No. Medications  No. Follow up visit  No.  Do you have questions or concerns about your Care? Yes.    Actions: * If pain score is 4 or above: Physician/ provider Notified : Owens Loffler, MD   Patient denies abdominal pain, but states her stomach feels "unsettled" since her procedure on Friday. It was somewhat inproved Saturday, but then became unsettled again on Sunday. Pt.c/o nausea, denies vomiting. Pt. States she has been passing air and belching. States she had about 3 to 4 episodes of bleeding per rectum described as a couple of small blood clots and then small "strings" of blood. She then had a small amount of blood with a small soft stool. Pt. States she has been able to stay hydrated, but has only been able to eat small amounts of food and saltine crackers. Advised patient to try over the counter Simethicone in order to continue to pass air and that I will route this message to Dr. Ardis Hughs for additional advisement. Pt. Verbalized understanding.Marland Kitchen

## 2018-11-09 NOTE — Telephone Encounter (Signed)
I removed 5 polyps and biopsied the colon randomly.  The minor bleeding is not totally unexpected.  Sheila [name], can you call her to see how she is feeling this afternoon?  If still uncomfortable then I think she will need labs and abdominal films.  Thanks

## 2018-11-09 NOTE — Telephone Encounter (Signed)
The pt reports bloating, belching, abd pain that is the same as she had prior to any procedure.  Her symptoms are not new.  She says she has the same symptoms that caused her to seek care originally. When questioned she seemed irritated that her symptoms had resolved with the procedure.  I discussed with her that the procedure itself was  Not treatment and that Dr Ardis Hughs would have a better understanding of her symptoms after receiving pathology results.  I did assure her I would send this on to Dr Ardis Hughs for any recommendations at this time.

## 2018-12-14 ENCOUNTER — Other Ambulatory Visit: Payer: Self-pay | Admitting: Family Medicine

## 2018-12-14 DIAGNOSIS — G43009 Migraine without aura, not intractable, without status migrainosus: Secondary | ICD-10-CM

## 2018-12-30 ENCOUNTER — Telehealth: Payer: Federal, State, Local not specified - PPO | Admitting: Nurse Practitioner

## 2018-12-30 DIAGNOSIS — J0110 Acute frontal sinusitis, unspecified: Secondary | ICD-10-CM | POA: Diagnosis not present

## 2018-12-30 MED ORDER — DOXYCYCLINE HYCLATE 100 MG PO TABS: 100.0000 mg | ORAL_TABLET | Freq: Two times a day (BID) | ORAL | 0 refills | Status: DC

## 2018-12-30 NOTE — Progress Notes (Signed)
We are sorry that you are not feeling well.  Here is how we plan to help!  Based on what you have shared with me it looks like you have sinusitis.  Sinusitis is inflammation and infection in the sinus cavities of the head.  Based on your presentation I believe you most likely have Acute Bacterial Sinusitis.  This is an infection caused by bacteria and is treated with antibiotics. I have prescribed Doxycycline 147m by mouth twice a day for 10 days. You may use an oral decongestant such as Mucinex D or if you have glaucoma or high blood pressure use plain Mucinex. Saline nasal spray help and can safely be used as often as needed for congestion.  If you develop worsening sinus pain, fever or notice severe headache and vision changes, or if symptoms are not better after completion of antibiotic, please schedule an appointment with a health care provider.    Sinus infections are not as easily transmitted as other respiratory infection, however we still recommend that you avoid close contact with loved ones, especially the very young and elderly.  Remember to wash your hands thoroughly throughout the day as this is the number one way to prevent the spread of infection!  Home Care:  Only take medications as instructed by your medical team.  Complete the entire course of an antibiotic.  Do not take these medications with alcohol.  A steam or ultrasonic humidifier can help congestion.  You can place a towel over your head and breathe in the steam from hot water coming from a faucet.  Avoid close contacts especially the very young and the elderly.  Cover your mouth when you cough or sneeze.  Always remember to wash your hands.  Get Help Right Away If:  You develop worsening fever or sinus pain.  You develop a severe head ache or visual changes.  Your symptoms persist after you have completed your treatment plan.  Make sure you  Understand these instructions.  Will watch your  condition.  Will get help right away if you are not doing well or get worse.  Your e-visit answers were reviewed by a board certified advanced clinical practitioner to complete your personal care plan.  Depending on the condition, your plan could have included both over the counter or prescription medications.  If there is a problem please reply  once you have received a response from your provider.  Your safety is important to [place]  If you have drug allergies check your prescription carefully.    You can use MyChart to ask questions about today's visit, request a non-urgent call back, or ask for a work or school excuse for 24 hours related to this e-Visit. If it has been greater than 24 hours you will need to follow up with your provider, or enter a new e-Visit to address those concerns.  You will get an e-mail in the next two days asking about your experience.  I hope that your e-visit has been valuable and will speed your recovery. Thank you for using e-visits.  5 minutes spent reviewing and documenting in chart.

## 2019-02-01 DIAGNOSIS — Z1231 Encounter for screening mammogram for malignant neoplasm of breast: Secondary | ICD-10-CM | POA: Diagnosis not present

## 2019-02-01 DIAGNOSIS — Z6823 Body mass index (BMI) 23.0-23.9, adult: Secondary | ICD-10-CM | POA: Diagnosis not present

## 2019-02-01 DIAGNOSIS — Z01419 Encounter for gynecological examination (general) (routine) without abnormal findings: Secondary | ICD-10-CM | POA: Diagnosis not present

## 2019-02-04 ENCOUNTER — Other Ambulatory Visit: Payer: Self-pay | Admitting: Obstetrics and Gynecology

## 2019-02-04 DIAGNOSIS — Z803 Family history of malignant neoplasm of breast: Secondary | ICD-10-CM

## 2019-02-04 DIAGNOSIS — Z9189 Other specified personal risk factors, not elsewhere classified: Secondary | ICD-10-CM

## 2019-02-27 ENCOUNTER — Ambulatory Visit
Admission: RE | Admit: 2019-02-27 | Discharge: 2019-02-27 | Disposition: A | Discharge status: Home / Self Care | Payer: Federal, State, Local not specified - PPO | Source: Ambulatory Visit | Attending: Obstetrics and Gynecology | Admitting: Obstetrics and Gynecology

## 2019-02-27 DIAGNOSIS — N644 Mastodynia: Secondary | ICD-10-CM | POA: Diagnosis not present

## 2019-02-27 DIAGNOSIS — Z803 Family history of malignant neoplasm of breast: Secondary | ICD-10-CM

## 2019-02-27 DIAGNOSIS — Z9189 Other specified personal risk factors, not elsewhere classified: Secondary | ICD-10-CM

## 2019-02-27 MED ORDER — GADOBUTROL 1 MMOL/ML IV SOLN
7.0000 mL | Freq: Once | INTRAVENOUS | Status: AC | PRN
  Administered 2019-02-27: 7 mL via INTRAVENOUS

## 2019-03-02 DIAGNOSIS — N644 Mastodynia: Secondary | ICD-10-CM | POA: Diagnosis not present

## 2019-03-02 DIAGNOSIS — Z8041 Family history of malignant neoplasm of ovary: Secondary | ICD-10-CM | POA: Diagnosis not present

## 2019-03-02 DIAGNOSIS — Z1321 Encounter for screening for nutritional disorder: Secondary | ICD-10-CM | POA: Diagnosis not present

## 2019-03-02 DIAGNOSIS — Z9189 Other specified personal risk factors, not elsewhere classified: Secondary | ICD-10-CM | POA: Diagnosis not present

## 2019-03-02 DIAGNOSIS — R102 Pelvic and perineal pain: Secondary | ICD-10-CM | POA: Diagnosis not present

## 2019-03-02 DIAGNOSIS — Z1329 Encounter for screening for other suspected endocrine disorder: Secondary | ICD-10-CM | POA: Diagnosis not present

## 2019-03-02 DIAGNOSIS — N912 Amenorrhea, unspecified: Secondary | ICD-10-CM | POA: Diagnosis not present

## 2019-03-02 DIAGNOSIS — Z13220 Encounter for screening for lipoid disorders: Secondary | ICD-10-CM | POA: Diagnosis not present

## 2019-03-02 DIAGNOSIS — Z13228 Encounter for screening for other metabolic disorders: Secondary | ICD-10-CM | POA: Diagnosis not present

## 2019-03-04 DIAGNOSIS — H1045 Other chronic allergic conjunctivitis: Secondary | ICD-10-CM | POA: Diagnosis not present

## 2019-03-04 DIAGNOSIS — H16423 Pannus (corneal), bilateral: Secondary | ICD-10-CM | POA: Diagnosis not present

## 2019-03-04 DIAGNOSIS — H16223 Keratoconjunctivitis sicca, not specified as Sjogren's, bilateral: Secondary | ICD-10-CM | POA: Diagnosis not present

## 2019-03-09 ENCOUNTER — Telehealth (INDEPENDENT_AMBULATORY_CARE_PROVIDER_SITE_OTHER): Payer: Federal, State, Local not specified - PPO | Admitting: Pulmonary Disease

## 2019-03-09 ENCOUNTER — Encounter: Payer: Self-pay | Admitting: Pulmonary Disease

## 2019-03-09 DIAGNOSIS — M329 Systemic lupus erythematosus, unspecified: Secondary | ICD-10-CM

## 2019-03-09 DIAGNOSIS — J4531 Mild persistent asthma with (acute) exacerbation: Secondary | ICD-10-CM

## 2019-03-09 DIAGNOSIS — J4530 Mild persistent asthma, uncomplicated: Secondary | ICD-10-CM | POA: Insufficient documentation

## 2019-03-09 MED ORDER — PREDNISONE 10 MG PO TABS: ORAL_TABLET | ORAL | 0 refills | Status: DC

## 2019-03-09 MED ORDER — FLUTICASONE-SALMETEROL 250-50 MCG/DOSE IN AEPB: INHALATION_SPRAY | RESPIRATORY_TRACT | 5 refills | Status: DC

## 2019-03-09 MED ORDER — AZITHROMYCIN 250 MG PO TABS: ORAL_TABLET | ORAL | 0 refills | Status: DC

## 2019-03-09 NOTE — Patient Instructions (Addendum)
Prednisone 27m tablet  >>>Take 2 tablets (20 mg total) daily for the next 5 days, then 1 tablet (130mdaily) for 5 days  >>> Take with food in the morning  Azithromycin 25038mablet  >>>Take 2 tablets (500m27mtal) today, and then 1 tablet (250mg34mr the next four days  >>>take with food  >>>can also take probiotic and / or yogurt while on antibiotic   Continue taking Wixela twice a day >>refilled today - 6  Practice good hand hygiene Stay active  Continue Xyzal  Start nasal saline rinses      Keep follow up with Urology, OBGYNHarle Battiest    Referral to [id number] [phone] in about 3 months (around 06/09/2019), or if symptoms worsen or fail to improve, for Follow up with Dr. McQuaLake Bellsoronavirus (COVID-19) Are you at risk?  Are you at risk for the Coronavirus (COVID-19)?  To be considered HIGH RISK for Coronavirus (COVID-19), you have to meet the following criteria:  . Traveled to [place] have fever, cough, and shortness of breath within the last 2 weeks of travel OR . Been in close contact with a person diagnosed with COVID-19 within the last 2 weeks and have fever, cough, and shortness of breath . IF YOU DO NOT MEET THESE CRITERIA, YOU ARE CONSIDERED LOW RISK FOR COVID-19.  What to do if you are HIGH RISK for COVID-19?  . If Marland Kitchenou are having a medical emergency, call 911. . Seek medical care right away. Before you go to a doctor's office, urgent care or emergency department, call ahead and tell them about your recent travel, contact with someone diagnosed with COVID-19, and your symptoms. You should receive instructions from your physician's office regarding next steps of care.  . When you arrive at healthcare provider, tell the healthcare staff immediately  you have returned from visiting [place] last two weeks or you have been in close contact with a person diagnosed with COVID-19 in the last 2 weeks.   . Tell the health care staff about your symptoms: fever, cough and shortness of breath. . After you have been seen by a medical provider, you will be either: o Tested for (COVID-19) and discharged home on quarantine except to seek medical care if symptoms worsen, and asked to  - Stay home and avoid contact with others until you get your results (4-5 days)  - Avoid travel on public transportation if possible (such as bus, train, or airplane) or o Sent to the Emergency Department by EMS for evaluation, COVID-19 testing, and possible admission depending on your condition and test results.  What to do if you are LOW RISK for COVID-19?  Reduce your risk of any infection by using the same precautions used for avoiding the common cold or flu:  . WasMarland Kitchen your hands often with soap and warm water for at least 20 seconds.  If soap and water are not readily available, use an alcohol-based hand sanitizer with at least 60% alcohol.  . If coughing or sneezing, cover your mouth and nose by coughing or sneezing into the elbow areas of your shirt or coat, into a tissue or  into your sleeve (not your hands). . Avoid shaking hands with others and consider head nods or verbal greetings only. . Avoid touching your eyes, nose, or mouth with unwashed hands.  . Avoid close contact with people who are sick. . Avoid places or events with large numbers of people in one location, like concerts or sporting events. . Carefully consider travel plans you have or are making. . If you are planning any travel outside or inside the [place], visit the CDC's Travelers' Health webpage for the latest health notices. . If you have some symptoms but not all symptoms, continue  to monitor at home and seek medical attention if your symptoms worsen. . If you are having a medical emergency, call 911.   [place] / e-Visit: [email or web address]         [facility]: [facility]: [phone]                   [facility]: [phone]           It is flu season:   >>> Best ways to protect herself from the flu: Receive the yearly flu vaccine, practice good hand hygiene washing with soap and also using hand sanitizer when available, eat a nutritious meals, get adequate rest, hydrate appropriately   Please contact the office if your symptoms worsen or you have concerns that you are not improving.   Thank you for choosing [facility] for your healthcare, and for allowing [place] to partner with you on your healthcare journey. I am thankful to be able to provide care to you today.   Wyn Quaker FNP-C

## 2019-03-09 NOTE — Progress Notes (Signed)
Virtual Visit via Video Note  I connected with Sheila [name] on 03/09/19 at  2:00 PM EDT by a video enabled telemedicine application and verified that I am speaking with the correct person using two identifiers.  Location: Patient: Home Provider: Office - [facility] - [address], [address], [place], [place] [address]  I discussed the limitations of evaluation and management by telemedicine and the availability of in person appointments. The patient expressed understanding and agreed to proceed. I also discussed with the patient that there may be a patient responsible charge related to this service. The patient expressed understanding and agreed to proceed.  Patient consented to consult via telephone: Yes People present and their role in pt care: Pt   History of Present Illness:  Synopsis: Referred in April 2019 for Cough, dyspnea.  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  Has moderate airflow obstruction and Lupus.  Maintenance: [address]  Patient of Dr. Lake Bells  Chief complaint:    53 year old female followed in our office for asthma.  Patient is maintained on weight sella.  Patient reports that she is had increased heaviness in breathing over the past couple of weeks, wheezing, increased cough, increased mucus production which is clear sometimes white, and occasionally wakes up gasping for air times.  Patient reports that her breathing was doing quite well until she had a knee surgery in December/2019 and she feels that her breathing is worsened since then.  Patient also with known lupus but currently does not have local rheumatology follow-up.  She is to be maintained on Plaquenil but now is not any on any sort of maintenance therapies for her lupus.  She has concerns that her lupus may be flaring.  Recent OB/GYN follow-up noted that her liver enzymes were slightly elevated they are repeating this blood work in 6 weeks, patient also has low  vitamin D which they are treating with supplemental vitamin D.  She has follow-up lab work scheduled in 6 weeks.  She also is going to see an OB/GYN oncologist due to genetic [place] testing OB/GYN did.   Observations/Objective:  Chest imaging: 10/2017 CXR images reviewed: normal appearing pulmonary parenchyma May 2019 high-resolution CT scan of the chest showing normal pulmonary parenchyma, no evidence of interstitial lung disease, 3 mm nodule noted   PFT: 11/2017 PFT: Ratio 63%, FEV1 2.23 L (79% pred), FVC 3.72 (105% pred), TLC 5.89L (116% pre), RV 2.48 L (139%), DLCO 24.66 (101% pre)   Assessment and Plan:  LUPUS ERYTHEMATOSUS Plan: Referral to [facility] rheumatology Dr. Trudie Reed  Mild persistent asthma without complication Plan: Short course of prednisone, lower dose of prednisone due to patient's chronic anxiety Zpak today  Continue Wixella Follow-up with our office in 3 months    Follow Up Instructions:  Return in about 3 months (around 06/09/2019), or if symptoms worsen or fail to improve, for Follow up with Dr. Lake Bells.    I discussed the assessment and treatment plan with the patient. The patient was provided an opportunity to ask questions and all were answered. The patient agreed with the plan and demonstrated an understanding of the instructions.   The patient was advised to call back or seek an in-person evaluation if the symptoms worsen or if the condition fails to improve as anticipated.  I provided 28 minutes of non-face-to-face time during this encounter.   Lauraine Rinne, NP

## 2019-03-09 NOTE — Assessment & Plan Note (Signed)
Plan: Short course of prednisone, lower dose of prednisone due to patient's chronic anxiety Zpak today  Continue Wixella Follow-up with our office in 3 months

## 2019-03-09 NOTE — Assessment & Plan Note (Signed)
Plan: Referral to [facility] rheumatology Dr. Trudie Reed

## 2019-03-22 DIAGNOSIS — R102 Pelvic and perineal pain: Secondary | ICD-10-CM | POA: Diagnosis not present

## 2019-03-22 DIAGNOSIS — Z803 Family history of malignant neoplasm of breast: Secondary | ICD-10-CM | POA: Diagnosis not present

## 2019-03-22 DIAGNOSIS — Z8041 Family history of malignant neoplasm of ovary: Secondary | ICD-10-CM | POA: Diagnosis not present

## 2019-03-31 DIAGNOSIS — D259 Leiomyoma of uterus, unspecified: Secondary | ICD-10-CM | POA: Diagnosis not present

## 2019-03-31 DIAGNOSIS — Z8041 Family history of malignant neoplasm of ovary: Secondary | ICD-10-CM | POA: Diagnosis not present

## 2019-04-01 DIAGNOSIS — H524 Presbyopia: Secondary | ICD-10-CM | POA: Diagnosis not present

## 2019-04-01 DIAGNOSIS — H16423 Pannus (corneal), bilateral: Secondary | ICD-10-CM | POA: Diagnosis not present

## 2019-04-01 DIAGNOSIS — H5213 Myopia, bilateral: Secondary | ICD-10-CM | POA: Diagnosis not present

## 2019-04-01 DIAGNOSIS — H52203 Unspecified astigmatism, bilateral: Secondary | ICD-10-CM | POA: Diagnosis not present

## 2019-04-01 DIAGNOSIS — H1045 Other chronic allergic conjunctivitis: Secondary | ICD-10-CM | POA: Diagnosis not present

## 2019-04-01 DIAGNOSIS — K080 Exfoliation of teeth due to systemic causes: Secondary | ICD-10-CM | POA: Diagnosis not present

## 2019-04-01 DIAGNOSIS — H16223 Keratoconjunctivitis sicca, not specified as Sjogren's, bilateral: Secondary | ICD-10-CM | POA: Diagnosis not present

## 2019-04-26 DIAGNOSIS — E559 Vitamin D deficiency, unspecified: Secondary | ICD-10-CM | POA: Diagnosis not present

## 2019-04-26 DIAGNOSIS — R748 Abnormal levels of other serum enzymes: Secondary | ICD-10-CM | POA: Diagnosis not present

## 2019-05-22 ENCOUNTER — Other Ambulatory Visit: Payer: Self-pay | Admitting: Family Medicine

## 2019-05-22 DIAGNOSIS — F419 Anxiety disorder, unspecified: Secondary | ICD-10-CM

## 2019-06-09 ENCOUNTER — Other Ambulatory Visit: Payer: Self-pay | Admitting: Family Medicine

## 2019-06-09 ENCOUNTER — Encounter: Payer: Self-pay | Admitting: Family Medicine

## 2019-06-09 DIAGNOSIS — J302 Other seasonal allergic rhinitis: Secondary | ICD-10-CM

## 2019-06-10 ENCOUNTER — Other Ambulatory Visit: Payer: Self-pay

## 2019-06-10 NOTE — Telephone Encounter (Signed)
She does need a visit --- virtual is fine

## 2019-06-11 ENCOUNTER — Encounter: Payer: Self-pay | Admitting: Family Medicine

## 2019-06-11 ENCOUNTER — Ambulatory Visit: Payer: Federal, State, Local not specified - PPO | Admitting: Family Medicine

## 2019-06-11 VITALS — BP 110/74 | HR 71 | Temp 97.0°F | Ht 64.0 in | Wt 142.4 lb

## 2019-06-11 DIAGNOSIS — Z23 Encounter for immunization: Secondary | ICD-10-CM | POA: Diagnosis not present

## 2019-06-11 DIAGNOSIS — R040 Epistaxis: Secondary | ICD-10-CM

## 2019-06-11 NOTE — Patient Instructions (Addendum)
If you do not hear anything about your referral in the next 1-2 weeks, call our office and ask for an update.  Consider using antibiotic ointment up your nose after a nosebleed. Vaseline is an alternative. Consider an air humidifier.   You do not need another pneumonia vaccine at this time.  Let us know if you need anything.   Nosebleed, Adult A nosebleed is when blood comes out of the nose. Nosebleeds are common. Usually, they are not a sign of a serious condition. Nosebleeds can happen if a small blood vessel in your nose starts to bleed or if the lining of your nose (mucous membrane) cracks. They are commonly caused by:  Allergies.  Colds.  Picking your nose.  Blowing your nose too hard.  An injury from sticking an object into your nose or getting hit in the nose.  Dry or cold air. Less common causes of nosebleeds include:  Toxic fumes.  Something abnormal in the nose or in the air-filled spaces in the bones of the face (sinuses).  Growths in the nose, such as polyps.  Medicines or conditions that cause blood to clot slowly.  Certain illnesses or procedures that irritate or dry out the nasal passages. Follow these instructions at home: When you have a nosebleed:   Sit down and tilt your head slightly forward.  Use a clean towel or tissue to pinch your nostrils under the bony part of your nose. After 10 minutes, let go of your nose and see if bleeding starts again. Do not release pressure before that time. If there is still bleeding, repeat the pinching and holding for 10 minutes until the bleeding stops.  Do not place tissues or gauze in the nose to stop bleeding.  Avoid lying down and avoid tilting your head backward. That may make blood collect in the throat and cause gagging or coughing.  Use a nasal spray decongestant to help with a nosebleed as told by your health care provider.  Do not use petroleum jelly or mineral oil in your nose. It can drip into your  lungs. After a nosebleed:  Avoid blowing your nose or sniffing for a number of hours.  Avoid straining, lifting, or bending at the waist for several days. You may resume other normal activities as you are able.  Use saline spray or a humidifier as told by your health care provider.  Aspirinand blood thinners make bleeding more likely. If you are prescribed these medicines and you suffer from nosebleeds: ? Ask your health care provider if you should stop taking the medicines or if you should adjust the dose. ? Do not stop taking medicines that your health care provider has recommended unless told by your health care provider.  If your nosebleed was caused by dry mucous membranes, use over-the-counter saline nasal spray or gel. This will keep the mucous membranes moist and allow them to heal. If you must use a lubricant: ? Choose one that is water-soluble. ? Use only as much as you need and use it only as often as needed. ? Do not lie down until several hours after you use it. Contact a health care provider if:  You have a fever.  You get nosebleeds often or more often than usual.  You bruise very easily.  You have a nosebleed from having something stuck in your nose.  You have bleeding in your mouth.  You vomit or cough up brown material.  You have a nosebleed after you start a  new medicine. Get help right away if:  You have a nosebleed after a fall or a head injury.  Your nosebleed does not go away after 20 minutes.  You feel dizzy or weak.  You have unusual bleeding from other parts of your body.  You have unusual bruising on other parts of your body.  You become sweaty.  You vomit blood. This information is not intended to replace advice given to you by your health care provider. Make sure you discuss any questions you have with your health care provider. Document Released: 06/12/2005 Document Revised: 12/02/2017 Document Reviewed: 03/19/2016 Elsevier Patient  Education  2020 [employer].

## 2019-06-11 NOTE — Addendum Note (Signed)
Addended by: Hinton Dyer on: 06/11/2019 03:05 PM   Modules accepted: Orders

## 2019-06-11 NOTE — Progress Notes (Signed)
Chief Complaint  Patient presents with  . Epistaxis    8 months eveyday. 2 to 3 times per day    Subjective: Patient is a 53 y.o. female here for nosebleeds.  Has been going on for 8 mo out of the R side.  No injury or inciting event.  She also has pain high up inside her nose.  There is no drainage from her nose otherwise.  Early in the course of this issue, she was having 2-3/week.  Now she is having 2-3/day.  The left side is unaffected.  No other areas of easy bruising or bleeding.  Each episode lasts for 2-3 minutes.  ROS: HEENT: As noted in HPI  Past Medical History:  Diagnosis Date  . Anal fissure   . Anxiety   . Chronic interstitial cystitis   . Crohn disease ([place])   . Fibromyalgia   . Migraines   . Pneumonia 2007  . SLE (systemic lupus erythematosus) (HCC)     Objective: BP 110/74 (BP Location: Left Arm, Patient Position: Sitting, Cuff Size: Normal)   Pulse 71   Temp (!) 97 F (36.1 C) (Temporal)   Ht 5' 4"  (1.626 m)   Wt 142 lb 6 oz (64.6 kg)   SpO2 98%   BMI 24.44 kg/m  General: Awake, appears stated age HEENT: Nose patent b/l; small varicosity over the septum on the right, left side is unremarkable, mild amount of rhinorrhea on the right as well Lungs: No accessory muscle use Psych: Age appropriate judgment and insight, normal affect and mood  Assessment and Plan: Epistaxis - Plan: Ambulatory referral to ENT  Refer to ENT for longstanding nosebleeds that are worsening.  Recommended triple antibiotic ointment twice daily after nosebleeds and Vaseline as an alternative.  Humidifier also recommended.  Avoid digital trauma. Flu shot today, follow-up with me as needed. The patient voiced understanding and agreement to the plan.  [place], DO 06/11/19  2:39 PM

## 2019-06-14 DIAGNOSIS — M329 Systemic lupus erythematosus, unspecified: Secondary | ICD-10-CM | POA: Diagnosis not present

## 2019-06-14 DIAGNOSIS — M7071 Other bursitis of hip, right hip: Secondary | ICD-10-CM | POA: Diagnosis not present

## 2019-06-14 DIAGNOSIS — M7072 Other bursitis of hip, left hip: Secondary | ICD-10-CM | POA: Diagnosis not present

## 2019-06-14 DIAGNOSIS — M3500 Sicca syndrome, unspecified: Secondary | ICD-10-CM | POA: Diagnosis not present

## 2019-06-14 DIAGNOSIS — K5010 Crohn's disease of large intestine without complications: Secondary | ICD-10-CM | POA: Diagnosis not present

## 2019-06-14 DIAGNOSIS — R5383 Other fatigue: Secondary | ICD-10-CM | POA: Diagnosis not present

## 2019-06-15 ENCOUNTER — Telehealth: Payer: Self-pay | Admitting: Pulmonary Disease

## 2019-06-15 NOTE — Telephone Encounter (Signed)
Ok will leave in triage until pt calls back or so we can follow up.

## 2019-06-15 NOTE — Telephone Encounter (Signed)
Thank you yes well aware the patient saw a rheumatology.  I have received their notes.  I have put it in the stack to be reviewed by Dr. Carlis Abbott and her look at folder.  Please make sure the patient get scheduled for the visit to establish with Dr. Carlis Abbott.  This will need to be a 30-minute visit.  Wyn Quaker FNP

## 2019-06-15 NOTE — Telephone Encounter (Signed)
06/15/2019 1535  Triage,  Please contact the patient and let her know that Dr. Lake Bells will no longer be returning to clinic.  He will remain full-time working with our practice on the inpatient side as he is currently working at [employer].  We are transitioning his patients to other pulmonary providers here in our office.  I would like to have the patient come into our office to establish care with either: Dr. Carlis Abbott.  This will need to be a 30-minute office visit as this is a former Dr. Lake Bells patient will be establishing with them for the first time.  This appointment needs to be made within the next 4 to 6 weeks.   Wyn Quaker, FNP

## 2019-06-15 NOTE — Telephone Encounter (Signed)
Spoke with pt, advised her that BQ was not returning to the office and that Aaron Edelman suggested that she establish with Dr. Carlis Abbott. Pt agreed. She also wanted to let Aaron Edelman know that she saw the rheumatologist and will schedule her appt with Dr. Carlis Abbott after the follow up. Sheila [name]

## 2019-06-17 ENCOUNTER — Other Ambulatory Visit: Payer: Self-pay | Admitting: Family Medicine

## 2019-06-17 DIAGNOSIS — G43009 Migraine without aura, not intractable, without status migrainosus: Secondary | ICD-10-CM

## 2019-06-18 NOTE — Telephone Encounter (Signed)
06/18/2019 1744  C, Can we see if we get the patient scheduled to see Dr. Carlis Abbott.  Please see previous notes from triage.  Appointment should be over the next 4 to 6 weeks.  She is a previous patient of Dr. Lake Bells. Wyn Quaker FNP

## 2019-06-21 NOTE — Telephone Encounter (Signed)
Left message for patient to call back. I looked at Dr. Ainsley Spinner schedule and she does have openings in 4-6 weeks. Will leave this encounter open to follow up on.

## 2019-06-25 NOTE — Telephone Encounter (Signed)
Left message for patient to call back  

## 2019-06-28 NOTE — Telephone Encounter (Signed)
Letter will be sent to patient to have her to contact our office.

## 2019-07-12 DIAGNOSIS — K5010 Crohn's disease of large intestine without complications: Secondary | ICD-10-CM | POA: Diagnosis not present

## 2019-07-12 DIAGNOSIS — M2550 Pain in unspecified joint: Secondary | ICD-10-CM | POA: Diagnosis not present

## 2019-07-12 DIAGNOSIS — M329 Systemic lupus erythematosus, unspecified: Secondary | ICD-10-CM | POA: Diagnosis not present

## 2019-07-12 DIAGNOSIS — M545 Low back pain: Secondary | ICD-10-CM | POA: Diagnosis not present

## 2019-08-05 ENCOUNTER — Ambulatory Visit: Payer: Federal, State, Local not specified - PPO | Admitting: Critical Care Medicine

## 2019-08-05 ENCOUNTER — Other Ambulatory Visit: Payer: Self-pay

## 2019-08-05 ENCOUNTER — Encounter: Payer: Self-pay | Admitting: Critical Care Medicine

## 2019-08-05 VITALS — BP 102/64 | HR 72 | Temp 97.8°F | Ht 64.0 in | Wt 147.2 lb

## 2019-08-05 DIAGNOSIS — J4531 Mild persistent asthma with (acute) exacerbation: Secondary | ICD-10-CM

## 2019-08-05 DIAGNOSIS — Z148 Genetic carrier of other disease: Secondary | ICD-10-CM | POA: Diagnosis not present

## 2019-08-05 MED ORDER — ALBUTEROL SULFATE HFA 108 (90 BASE) MCG/ACT IN AERS: 2.0000 | INHALATION_SPRAY | Freq: Four times a day (QID) | RESPIRATORY_TRACT | 6 refills | Status: DC | PRN

## 2019-08-05 NOTE — Patient Instructions (Signed)
Thank you for visiting Dr. Carlis Abbott at [facility]. We recommend the following:  Return in about 1 year (around 08/04/2020).    Please do your part to reduce the spread of COVID-19.

## 2019-08-05 NOTE — Progress Notes (Signed)
Synopsis: Referred in March 2019 for persistent asthma by Carollee Herter, Alferd Apa, *.  Previously patient of Dr. Lake Bells.  Subjective:   PATIENT ID: Sheila [name] GENDER: female DOB: 08-01-1966, MRN: [id number]  Chief Complaint  Patient presents with  . Follow-up    Patient is here to establish care. Patient has no complaints at this time.     Mrs. Evalina Field is a 53 year old woman with a history of SLE who presents for follow-up.  She has previously been treated for asthma and was on [place] for maintenance, which she has stopped in the last few months on her own.  She had an exacerbation requiring steroids and azithromycin in June 2020.  She does not feel as though Wixela ever improved her symptoms, and she could not tell a difference day-to-day that her symptoms were changing, so she did not think that it was working.  She is not sure if she has had worsening of her chronic symptoms since stopping it as she feels that her shortness of breath is multifactorial from her multiple rheumatologic diseases.  She gets short of breath walking up a flight of stairs.  She has a history of chronic hoarseness and occasional chest heaviness that she is not able to relate to her breathing.  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  Follows with Dr. Lenna Gilford at [facility] rheumatology.  He is up-to-date on her seasonal flu and pneumonia vaccines.  No fever, chills, sweats, or other infectious symptoms.  Her weight has increased and she is unsure why.     Past Medical History:  Diagnosis Date  . Anal fissure   . Anxiety   . Chronic interstitial cystitis   . Crohn disease ([employer])   . Fibromyalgia   . Migraines   . Pneumonia 2005/12/09  . SLE (systemic lupus erythematosus) (H[facility])      Family History  Problem Relation Age of Onset  . Breast cancer Mother   . Lung cancer Father        Died in 2011/12/10  . Breast cancer Maternal Grandmother   . Breast cancer Maternal Aunt   . Prostate cancer Maternal Grandfather   .  Irritable bowel syndrome Sister   . Colon cancer Neg Hx   . Colon polyps Neg Hx   . Esophageal cancer Neg Hx   . Kidney disease Neg Hx   . Gallbladder disease Neg Hx   . Rectal cancer Neg Hx   . Stomach cancer Neg Hx      Past Surgical History:  Procedure Laterality Date  . BLADDER SURGERY    . CESAREAN SECTION    . KNEE ARTHROSCOPY    . OOPHORECTOMY     right  . TONSILLECTOMY    . WRIST SURGERY      Social History   Socioeconomic History  . Marital status: Married    Spouse name: Not on file  . Number of children: 2  . Years of education: Not on file  . Highest education level: Not on file  Occupational History  . Occupation: Print production planner  Social Needs  . Financial resource strain: Not on file  . Food insecurity    Worry: Not on file    Inability: Not on file  . Transportation needs    Medical: Not on file    Non-medical: Not on file  Tobacco Use  . Smoking status: Never Smoker  . Smokeless tobacco: Never Used  Substance and Sexual Activity  . Alcohol use: No  Alcohol/week: 0.0 standard drinks  . Drug use: No  . Sexual activity: Not on file  Lifestyle  . Physical activity    Days per week: Not on file    Minutes per session: Not on file  . Stress: Not on file  Relationships  . Social Herbalist on phone: Not on file    Gets together: Not on file    Attends religious service: Not on file    Active member of club or organization: Not on file    Attends meetings of clubs or organizations: Not on file    Relationship status: Not on file  . Intimate partner violence    Fear of current or ex partner: Not on file    Emotionally abused: Not on file    Physically abused: Not on file    Forced sexual activity: Not on file  Other Topics Concern  . Not on file  Social History Narrative  . Not on file     Allergies  Allergen Reactions  . Ampicillin   . Cymbalta [Duloxetine Hcl] Other (See Comments)    Constipation, bloating, nausea  .  Fentanyl Citrate     REACTION: vomiting, fever  . Penicillins      Immunization History  Administered Date(s) Administered  . Influenza Whole 08/04/2007, 08/29/2011, 07/29/2012  . Influenza,inj,Quad PF,6+ Mos 07/11/2015, 05/29/2018, 06/11/2019  . Pneumococcal Polysaccharide-23 05/29/2018  . Td 06/21/2010    Outpatient Medications Prior to Visit  Medication Sig Dispense Refill  . ALPRAZolam (XANAX) 0.5 MG tablet TAKE 1 TABLET(0.5 MG) BY MOUTH THREE TIMES DAILY AS NEEDED 90 tablet 0  . levocetirizine (XYZAL) 5 MG tablet TAKE 1 TABLET(5 MG) BY MOUTH EVERY EVENING 90 tablet 3  . sertraline (ZOLOFT) 100 MG tablet TAKE 1 TABLET(100 MG) BY MOUTH DAILY 90 tablet 3  . tiZANidine (ZANAFLEX) 4 MG tablet TAKE 1 TABLET BY MOUTH TWICE DAILY AS NEEDED 60 tablet 1  . topiramate (TOPAMAX) 100 MG tablet TAKE 1 TABLET BY MOUTH EVERY DAY 30 tablet 1  . Fluticasone-Salmeterol (WIXELA INHUB) 250-50 MCG/DOSE AEPB INHALE 1 PUFF INTO THE LUNGS TWICE DAILY 60 each 5  . OGESTREL 0.5-50 MG-MCG tablet Take 1 tablet by mouth daily.    Marland Kitchen omeprazole (PRILOSEC) 40 MG capsule Take 1 capsule (40 mg total) by mouth 2 (two) times daily. Take 30-60 minutes before breakfast and dinner. (Patient not taking: Reported on 03/09/2019) 60 capsule 3  . traZODone (DESYREL) 50 MG tablet 1-2 po qhs 60 tablet 1  . triamcinolone cream (KENALOG) 0.1 % Apply 1 application topically 2 (two) times daily. (Patient not taking: Reported on 08/05/2019) 45 g 3   No facility-administered medications prior to visit.     Review of Systems  Constitutional: Negative for chills and fever.       Weight gain  Eyes: Negative.   Cardiovascular: Negative for chest pain and leg swelling.  Gastrointestinal: Negative.   Genitourinary: Negative.   Musculoskeletal:       Chronic pain from lupus  Skin: Negative for rash.  Neurological: Negative for weakness.     Objective:   Vitals:   08/05/19 1140  BP: 102/64  Pulse: 72  Temp: 97.8 F (36.6  C)  TempSrc: Temporal  SpO2: 97%  Weight: 147 lb 3.2 oz (66.8 kg)  Height: 5' 4"  (1.626 m)   97% on   RA BMI Readings from Last 3 Encounters:  08/05/19 25.27 kg/m  06/11/19 24.44 kg/m  11/06/18 23.69 kg/m  Wt Readings from Last 3 Encounters:  08/05/19 147 lb 3.2 oz (66.8 kg)  06/11/19 142 lb 6 oz (64.6 kg)  11/06/18 138 lb (62.6 kg)    Physical Exam Vitals signs reviewed.  Constitutional:      General: She is not in acute distress.    Appearance: Normal appearance. She is not ill-appearing.  HENT:     Head: Normocephalic and atraumatic.     Nose:     Comments: Deferred due to masking requirement.    Mouth/Throat:     Comments: Deferred due to masking requirement. Eyes:     General: No scleral icterus. Neck:     Musculoskeletal: Neck supple.  Cardiovascular:     Rate and Rhythm: Normal rate and regular rhythm.     Heart sounds: No murmur.  Pulmonary:     Comments: Breathing comfortably on room air, occasionally pulling off her mask to take breaths.  No conversational dyspnea or accessory muscle use.  Clear to auscultation bilaterally. Abdominal:     General: There is no distension.     Palpations: Abdomen is soft.     Tenderness: There is no abdominal tenderness.  Musculoskeletal:        General: No swelling or deformity.  Lymphadenopathy:     Cervical: No cervical adenopathy.  Skin:    General: Skin is warm and dry.     Findings: No rash.  Neurological:     Mental Status: She is alert.     Motor: No weakness.     Coordination: Coordination normal.  Psychiatric:     Comments: Tearful and stressed appearing during the encounter when discussing her chronic medical conditions      CBC    Component Value Date/Time   WBC 8.6 10/13/2018 1510   RBC 4.57 10/13/2018 1510   HGB 14.3 10/13/2018 1510   HCT 42.2 10/13/2018 1510   PLT 268.0 10/13/2018 1510   MCV 92.3 10/13/2018 1510   MCH 30.3 10/07/2018 1643   MCHC 34.0 10/13/2018 1510   RDW 13.1  10/13/2018 1510   LYMPHSABS 2.9 10/13/2018 1510   MONOABS 0.8 10/13/2018 1510   EOSABS 0.2 10/13/2018 1510   BASOSABS 0.1 10/13/2018 1510      Chest Imaging- films reviewed: CT chest high-resolution 01/14/2017-airway thickening.  No emphysema or fibrotic changes.  No air trapping  Pulmonary Functions Testing Results: PFT Results Latest Ref Rng & Units 11/20/2017  FVC-Pre L 3.47  FVC-Predicted Pre % 98  FVC-Post L 3.72  FVC-Predicted Post % 105  Pre FEV1/FVC % % 63  Post FEV1/FCV % % 60  FEV1-Pre L 2.17  FEV1-Predicted Pre % 77  FEV1-Post L 2.23  DLCO UNC% % 101  DLCO COR %Predicted % 104  TLC L 5.89  TLC % Predicted % 116  RV % Predicted % 139   Mild obstruction with air trapping.  No significant response to bronchodilators, normal diffusion.  Flow volume loop concerning for fixed upper airway lesion, although expiratory flow curve does not meet ATS standards (Tech noted difficulty) raising suspicion that this is not of diagnostic significance.      Assessment & Plan:     ICD-10-CM   1. Mild persistent asthma with acute exacerbation  J45.31   2. Carrier of alpha-1-antitrypsin deficiency  Z14.8     Chronic dyspnea on exertion, possibly asthma.  History of alpha-1 antitrypsin deficiency carrier state with normal alpha-1 levels. -Agreed that she can stay off of Wixela if she does not feel as though  it is helping.  We should monitor her symptoms, and if she is having exacerbations, this would indicate that the medications were probably working.  This is something that can take time to determine. -Ongoing follow-up with rheumatology. -Albuterol inhaler as needed; inhaler training provided -Up-to-date on seasonal flu vaccine and pneumonia vaccines  RTC in 12 months or sooner as needed   Current Outpatient Medications:  .  ALPRAZolam (XANAX) 0.5 MG tablet, TAKE 1 TABLET(0.5 MG) BY MOUTH THREE TIMES DAILY AS NEEDED, Disp: 90 tablet, Rfl: 0 .  levocetirizine (XYZAL) 5 MG tablet,  TAKE 1 TABLET(5 MG) BY MOUTH EVERY EVENING, Disp: 90 tablet, Rfl: 3 .  sertraline (ZOLOFT) 100 MG tablet, TAKE 1 TABLET(100 MG) BY MOUTH DAILY, Disp: 90 tablet, Rfl: 3 .  tiZANidine (ZANAFLEX) 4 MG tablet, TAKE 1 TABLET BY MOUTH TWICE DAILY AS NEEDED, Disp: 60 tablet, Rfl: 1 .  topiramate (TOPAMAX) 100 MG tablet, TAKE 1 TABLET BY MOUTH EVERY DAY, Disp: 30 tablet, Rfl: 1 .  albuterol (VENTOLIN HFA) 108 (90 Base) MCG/ACT inhaler, Inhale 2 puffs into the lungs every 6 (six) hours as needed for wheezing or shortness of breath., Disp: 6.7 g, Rfl: 6 .  Fluticasone-Salmeterol (WIXELA INHUB) 250-50 MCG/DOSE AEPB, INHALE 1 PUFF INTO THE LUNGS TWICE DAILY, Disp: 60 each, Rfl: 5 .  OGESTREL 0.5-50 MG-MCG tablet, Take 1 tablet by mouth daily., Disp: , Rfl:  .  omeprazole (PRILOSEC) 40 MG capsule, Take 1 capsule (40 mg total) by mouth 2 (two) times daily. Take 30-60 minutes before breakfast and dinner. (Patient not taking: Reported on 03/09/2019), Disp: 60 capsule, Rfl: 3 .  traZODone (DESYREL) 50 MG tablet, 1-2 po qhs, Disp: 60 tablet, Rfl: 1 .  triamcinolone cream (KENALOG) 0.1 %, Apply 1 application topically 2 (two) times daily. (Patient not taking: Reported on 08/05/2019), Disp: 45 g, Rfl: 3   Julian Hy, DO [facility] 08/05/2019 12:06 PM

## 2019-08-06 ENCOUNTER — Other Ambulatory Visit: Payer: Self-pay

## 2019-08-06 ENCOUNTER — Encounter: Payer: Self-pay | Admitting: Internal Medicine

## 2019-08-06 ENCOUNTER — Ambulatory Visit: Payer: Federal, State, Local not specified - PPO | Admitting: Internal Medicine

## 2019-08-06 VITALS — BP 114/73 | HR 92 | Temp 97.3°F | Resp 16 | Ht 64.0 in | Wt 143.2 lb

## 2019-08-06 DIAGNOSIS — B029 Zoster without complications: Secondary | ICD-10-CM | POA: Diagnosis not present

## 2019-08-06 MED ORDER — VALACYCLOVIR HCL 1 G PO TABS: 1000.0000 mg | ORAL_TABLET | Freq: Three times a day (TID) | ORAL | 0 refills | Status: DC

## 2019-08-06 MED ORDER — GABAPENTIN 100 MG PO CAPS: 100.0000 mg | ORAL_CAPSULE | Freq: Every evening | ORAL | 0 refills | Status: DC | PRN

## 2019-08-06 NOTE — Progress Notes (Signed)
Pre visit review using our clinic review tool, if applicable. No additional management support is needed unless otherwise documented below in the visit note. 

## 2019-08-06 NOTE — Patient Instructions (Signed)
Start Valtrex for 1 week  Tylenol  500 mg OTC 2 tabs a day every 8 hours as needed for pain   if the pain continue, you could use some ibuprofen  IBUPROFEN (Advil or Motrin) 200 mg 2 tablets every 8 hours as needed for pain.  Always take it with food because may cause gastritis and ulcers.  If you notice nausea, stomach pain, change in the color of stools --->  Stop the medicine and let us know   In a couple of days if the pain is not well controlled try gabapentin, 1 or 2 tablets at bedtime. It will make you very sleepy because you also take Topamax, please be careful.  Call if not gradually better   Shingles  Shingles, which is also known as herpes zoster, is an infection that causes a painful skin rash and fluid-filled blisters. It is caused by a virus. Shingles only develops in people who:  Have had chickenpox.  Have been given a medicine to protect against chickenpox (have been vaccinated). Shingles is rare in this group. What are the causes? Shingles is caused by varicella-zoster virus (VZV). This is the same virus that causes chickenpox. After a person is exposed to VZV, the virus stays in the body in an inactive (dormant) state. Shingles develops if the virus is reactivated. This can happen many years after the first (initial) exposure to VZV. It is not known what causes this virus to be reactivated. What increases the risk? People who have had chickenpox or received the chickenpox vaccine are at risk for shingles. Shingles infection is more common in people who:  Are older than age 36.  Have a weakened disease-fighting system (immune system), such as people with: ? HIV. ? AIDS. ? Cancer.  Are taking medicines that weaken the immune system, such as transplant medicines.  Are experiencing a lot of stress. What are the signs or symptoms? Early symptoms of this condition include itching, tingling, and pain in an area on your skin. Pain may be described as burning,  stabbing, or throbbing. A few days or weeks after early symptoms start, a painful red rash appears. The rash is usually on one side of the body and has a band-like or belt-like pattern. The rash eventually turns into fluid-filled blisters that break open, change into scabs, and dry up in about 2-3 weeks. At any time during the infection, you may also develop:  A fever.  Chills.  A headache.  An upset stomach. How is this diagnosed? This condition is diagnosed with a skin exam. Skin or fluid samples may be taken from the blisters before a diagnosis is made. These samples are examined under a microscope or sent to a lab for testing. How is this treated? The rash may last for several weeks. There is not a specific cure for this condition. Your health care provider will probably prescribe medicines to help you manage pain, recover more quickly, and avoid long-term problems. Medicines may include:  Antiviral drugs.  Anti-inflammatory drugs.  Pain medicines.  Anti-itching medicines (antihistamines). If the area involved is on your face, you may be referred to a specialist, such as an eye doctor (ophthalmologist) or an ear, nose, and throat (ENT) doctor (otolaryngologist) to help you avoid eye problems, chronic pain, or disability. Follow these instructions at home: Medicines  Take over-the-counter and prescription medicines only as told by your health care provider.  Apply an anti-itch cream or numbing cream to the affected area as told by your  health care provider. Relieving itching and discomfort   Apply cold, wet cloths (cold compresses) to the area of the rash or blisters as told by your health care provider.  Cool baths can be soothing. Try adding baking soda or dry oatmeal to the water to reduce itching. Do not bathe in hot water. Blister and rash care  Keep your rash covered with a loose bandage (dressing). Wear loose-fitting clothing to help ease the pain of material rubbing  against the rash.  Keep your rash and blisters clean by washing the area with mild soap and cool water as told by your health care provider.  Check your rash every day for signs of infection. Check for: ? More redness, swelling, or pain. ? Fluid or blood. ? Warmth. ? Pus or a bad smell.  Do not scratch your rash or pick at your blisters. To help avoid scratching: ? Keep your fingernails clean and cut short. ? Wear gloves or mittens while you sleep, if scratching is a problem. General instructions  Rest as told by your health care provider.  Keep all follow-up visits as told by your health care provider. This is important.  Wash your hands often with soap and water. If soap and water are not available, use hand sanitizer. Doing this lowers your chance of getting a bacterial skin infection.  Before your blisters change into scabs, your shingles infection can cause chickenpox in people who have never had it or have never been vaccinated against it. To prevent this from happening, avoid contact with other people, especially: ? Babies. ? Pregnant women. ? Children who have eczema. ? Elderly people who have transplants. ? People who have chronic illnesses, such as cancer or AIDS. Contact a health care provider if:  Your pain is not relieved with prescribed medicines.  Your pain does not get better after the rash heals.  You have signs of infection in the rash area, such as: ? More redness, swelling, or pain around the rash. ? Fluid or blood coming from the rash. ? The rash area feeling warm to the touch. ? Pus or a bad smell coming from the rash. Get help right away if:  The rash is on your face or nose.  You have facial pain, pain around your eye area, or loss of feeling on one side of your face.  You have difficulty seeing.  You have ear pain or have ringing in your ear.  You have a loss of taste.  Your condition gets worse. Summary  Shingles, which is also known as  herpes zoster, is an infection that causes a painful skin rash and fluid-filled blisters.  This condition is diagnosed with a skin exam. Skin or fluid samples may be taken from the blisters and examined before the diagnosis is made.  Keep your rash covered with a loose bandage (dressing). Wear loose-fitting clothing to help ease the pain of material rubbing against the rash.  Before your blisters change into scabs, your shingles infection can cause chickenpox in people who have never had it or have never been vaccinated against it. This information is not intended to replace advice given to you by your health care provider. Make sure you discuss any questions you have with your health care provider. Document Released: 09/02/2005 Document Revised: 12/25/2018 Document Reviewed: 05/07/2017 Elsevier Patient Education  2020 [employer].

## 2019-08-06 NOTE — Progress Notes (Signed)
Subjective:    Patient ID: Sheila [name], female    DOB: 1966-02-22, 53 y.o.   MRN: [id number]  DOS:  08/06/2019  Acute visit 2 weeks ago, developed a ill-defined pain at the right armpit. The pain gradually got slightly worse. Few days ago developed burning, painful feeling at the triceps area. Last night, a rash started in the armpit, then back and chest.   Review of Systems No fever chills No MSK type of neck pain No rash of the face Pain is significant, unable to sleep. No shortness of breath or cough   Past Medical History:  Diagnosis Date  . Anal fissure   . Anxiety   . Chronic interstitial cystitis   . Crohn disease ([place])   . Fibromyalgia   . Migraines   . Pneumonia 2007  . SLE (systemic lupus erythematosus) ([place])     Past Surgical History:  Procedure Laterality Date  . BLADDER SURGERY    . CESAREAN SECTION    . KNEE ARTHROSCOPY    . OOPHORECTOMY     right  . TONSILLECTOMY    . WRIST SURGERY      Social History   Socioeconomic History  . Marital status: Married    Spouse name: Not on file  . Number of children: 2  . Years of education: Not on file  . Highest education level: Not on file  Occupational History  . Occupation: Print production planner  Social Needs  . Financial resource strain: Not on file  . Food insecurity    Worry: Not on file    Inability: Not on file  . Transportation needs    Medical: Not on file    Non-medical: Not on file  Tobacco Use  . Smoking status: Never Smoker  . Smokeless tobacco: Never Used  Substance and Sexual Activity  . Alcohol use: No    Alcohol/week: 0.0 standard drinks  . Drug use: No  . Sexual activity: Not on file  Lifestyle  . Physical activity    Days per week: Not on file    Minutes per session: Not on file  . Stress: Not on file  Relationships  . Social Herbalist on phone: Not on file    Gets together: Not on file    Attends religious service: Not on file    Active member of club or  organization: Not on file    Attends meetings of clubs or organizations: Not on file    Relationship status: Not on file  . Intimate partner violence    Fear of current or ex partner: Not on file    Emotionally abused: Not on file    Physically abused: Not on file    Forced sexual activity: Not on file  Other Topics Concern  . Not on file  Social History Narrative  . Not on file      Allergies as of 08/06/2019      Reactions   Ampicillin    Cymbalta [duloxetine Hcl] Other (See Comments)   Constipation, bloating, nausea   Fentanyl Citrate    REACTION: vomiting, fever   Penicillins       Medication List       Accurate as of August 06, 2019 11:31 AM. If you have any questions, ask your nurse or doctor.        albuterol 108 (90 Base) MCG/ACT inhaler Commonly known as: VENTOLIN HFA Inhale 2 puffs into the lungs every 6 (six) hours as  needed for wheezing or shortness of breath.   ALPRAZolam 0.5 MG tablet Commonly known as: XANAX TAKE 1 TABLET(0.5 MG) BY MOUTH THREE TIMES DAILY AS NEEDED   Fluticasone-Salmeterol 250-50 MCG/DOSE Aepb Commonly known as: Wixela Inhub INHALE 1 PUFF INTO THE LUNGS TWICE DAILY   levocetirizine 5 MG tablet Commonly known as: XYZAL TAKE 1 TABLET(5 MG) BY MOUTH EVERY EVENING   Ogestrel 0.5-50 MG-MCG tablet Generic drug: norgestrel-ethinyl estradiol Take 1 tablet by mouth daily.   omeprazole 40 MG capsule Commonly known as: PRILOSEC Take 1 capsule (40 mg total) by mouth 2 (two) times daily. Take 30-60 minutes before breakfast and dinner.   sertraline 100 MG tablet Commonly known as: ZOLOFT TAKE 1 TABLET(100 MG) BY MOUTH DAILY   tiZANidine 4 MG tablet Commonly known as: ZANAFLEX TAKE 1 TABLET BY MOUTH TWICE DAILY AS NEEDED   topiramate 100 MG tablet Commonly known as: TOPAMAX TAKE 1 TABLET BY MOUTH EVERY DAY   traZODone 50 MG tablet Commonly known as: DESYREL 1-2 po qhs   triamcinolone cream 0.1 % Commonly known as: KENALOG  Apply 1 application topically 2 (two) times daily.           Objective:   Physical Exam Skin:        BP 114/73 (BP Location: Left Arm, Patient Position: Sitting, Cuff Size: Small)   Pulse 92   Temp (!) 97.3 F (36.3 C) (Temporal)   Resp 16   Ht 5' 4"  (1.626 m)   Wt 143 lb 4 oz (65 kg)   SpO2 99%   BMI 24.59 kg/m  General:   Well developed, NAD, BMI noted. HEENT:  Normocephalic . Face symmetric, atraumatic Neck: Symmetric, no lymphadenopathies. Skin: See graphic Neurologic:  alert & oriented X3.  Speech normal, gait appropriate for age and unassisted Psych--  Cognition and judgment appear intact.  Cooperative with normal attention span and concentration.  Behavior appropriate. No anxious or depressed appearing.      Assessment    53  y/o female, several med problems including lupus, anxiety, Crohn's , migraines, FM, asthma, not on any immunosuppressants, presents with:  Shingles Symptoms consistent with shingles, explained the patient what that is and what to expect. Recommend Valtrex. For pain control use primarily Tylenol and also occasional ibuprofen if needed. She had some difficulty time sleeping last night due to pain, encouraged to use the medication she already has to help her sleep including Restoril and Xanax. If the pain is not completely well controlled, she could try gabapentin, prescription printed.  Watch for excessive sedation because she also take Topamax. Multiple questions are sent to the best of my ability.  this visit occurred during the SARS-CoV-2 public health emergency.  Safety protocols were in place, including screening questions prior to the visit, additional usage of staff PPE, and extensive cleaning of exam room while observing appropriate contact time as indicated for disinfecting solutions.

## 2019-08-16 ENCOUNTER — Telehealth: Payer: Self-pay

## 2019-08-16 NOTE — Telephone Encounter (Signed)
Spoke w/ Pt- she finished w/ the Valtrex about 4 days ago- she is still having some rash on her back and chest- wonders if she needs to have several more days of the Valtrex.

## 2019-08-16 NOTE — Telephone Encounter (Signed)
Copied from [place] [phone]. Topic: General - Inquiry >> Aug 16, 2019  4:20 PM Alease Frame wrote: Reason for CRM: Patient would like a call back from Dr Larose Kells nurse as soon as possible .  Call back number [phone]

## 2019-08-17 NOTE — Telephone Encounter (Signed)
Spoke w/ Pt- informed of recommendations. Pt verbalized understanding.  

## 2019-08-17 NOTE — Telephone Encounter (Signed)
More Valtrex is not needed.  The rash will take some time to go away. If she has new blisters or severe pain let me know

## 2019-09-06 DIAGNOSIS — E785 Hyperlipidemia, unspecified: Secondary | ICD-10-CM | POA: Diagnosis not present

## 2019-09-08 ENCOUNTER — Other Ambulatory Visit: Payer: Self-pay

## 2019-09-08 ENCOUNTER — Ambulatory Visit (INDEPENDENT_AMBULATORY_CARE_PROVIDER_SITE_OTHER): Payer: Federal, State, Local not specified - PPO | Admitting: Family Medicine

## 2019-09-08 ENCOUNTER — Encounter: Payer: Self-pay | Admitting: Family Medicine

## 2019-09-08 DIAGNOSIS — G43009 Migraine without aura, not intractable, without status migrainosus: Secondary | ICD-10-CM

## 2019-09-08 DIAGNOSIS — H04123 Dry eye syndrome of bilateral lacrimal glands: Secondary | ICD-10-CM | POA: Insufficient documentation

## 2019-09-08 DIAGNOSIS — F419 Anxiety disorder, unspecified: Secondary | ICD-10-CM

## 2019-09-08 MED ORDER — TOPIRAMATE 100 MG PO TABS: ORAL_TABLET | ORAL | 1 refills | Status: DC

## 2019-09-08 MED ORDER — FLUOXETINE HCL 20 MG PO TABS: 20.0000 mg | ORAL_TABLET | Freq: Every day | ORAL | 3 refills | Status: DC

## 2019-09-08 NOTE — Progress Notes (Signed)
Virtual Visit via Video Note  I connected with Sheila [name] on 09/08/19 at 11:00 AM EST by a video enabled telemedicine application and verified that I am speaking with the correct person using two identifiers.  Location: Patient: home --home alone  Provider: home    I discussed the limitations of evaluation and management by telemedicine and the availability of in person appointments. The patient expressed understanding and agreed to proceed.  History of Present Illness: Pt is home needing med refills and c/o of dry eyes and her eye dr thought it was the xyzal   she is also seeing gyn for post menopausal bleeding   Observations/Objective: No vitals obtained  Pt is in NAD   Assessment and Plan: 1. Anxiety Stop zoloft--- pt c/o weight gain and start prozac F/u 1 month or sooner prn  - FLUoxetine (PROZAC) 20 MG tablet; Take 1 tablet (20 mg total) by mouth daily.  Dispense: 30 tablet; Refill: 3  2. Migraine without aura and without status migrainosus, not intractable Stable--  con't topamax  - topiramate (TOPAMAX) 100 MG tablet; TAKE 1 TABLET BY MOUTH EVERY DAY  Dispense: 90 tablet; Refill: 1  3.dry eyes-- stop xyzal Follow Up Instructions:    I discussed the assessment and treatment plan with the patient. The patient was provided an opportunity to ask questions and all were answered. The patient agreed with the plan and demonstrated an understanding of the instructions.   The patient was advised to call back or seek an in-person evaluation if the symptoms worsen or if the condition fails to improve as anticipated.  I provided 15 minutes of non-face-to-face time during this encounter.   Ann Held, DO

## 2019-09-13 DIAGNOSIS — N939 Abnormal uterine and vaginal bleeding, unspecified: Secondary | ICD-10-CM | POA: Diagnosis not present

## 2019-09-13 DIAGNOSIS — D509 Iron deficiency anemia, unspecified: Secondary | ICD-10-CM | POA: Diagnosis not present

## 2019-09-13 DIAGNOSIS — N950 Postmenopausal bleeding: Secondary | ICD-10-CM | POA: Diagnosis not present

## 2019-09-13 DIAGNOSIS — N3010 Interstitial cystitis (chronic) without hematuria: Secondary | ICD-10-CM | POA: Diagnosis not present

## 2019-09-20 DIAGNOSIS — N950 Postmenopausal bleeding: Secondary | ICD-10-CM | POA: Diagnosis not present

## 2019-09-22 DIAGNOSIS — N924 Excessive bleeding in the premenopausal period: Secondary | ICD-10-CM | POA: Diagnosis not present

## 2019-09-22 DIAGNOSIS — N840 Polyp of corpus uteri: Secondary | ICD-10-CM | POA: Diagnosis not present

## 2019-09-22 DIAGNOSIS — N950 Postmenopausal bleeding: Secondary | ICD-10-CM | POA: Diagnosis not present

## 2019-09-28 DIAGNOSIS — N939 Abnormal uterine and vaginal bleeding, unspecified: Secondary | ICD-10-CM | POA: Diagnosis not present

## 2019-10-08 ENCOUNTER — Telehealth: Payer: Self-pay | Admitting: *Deleted

## 2019-10-08 ENCOUNTER — Telehealth: Payer: Self-pay | Admitting: Nurse Practitioner

## 2019-10-08 NOTE — Telephone Encounter (Signed)
Called and spoke with the patient; scheduled a new patient appt on 1/27. Gave the patient the address and policy for mask, visitors and parking

## 2019-10-08 NOTE — Telephone Encounter (Signed)
Sheila [name] has been cld and scheduled to see [name] on 2/8 at 1:45pm. Pt has been made aware to arrive 15 minutes early.

## 2019-10-11 ENCOUNTER — Encounter: Payer: Self-pay | Admitting: Family Medicine

## 2019-10-11 ENCOUNTER — Encounter: Payer: Self-pay | Admitting: Gynecologic Oncology

## 2019-10-11 NOTE — Progress Notes (Signed)
GYNECOLOGIC ONCOLOGY NEW PATIENT CONSULTATION   Patient Name: Sheila [name]  Patient Age: 54 y.o. Date of Service: 10/13/19 Referring Provider: Carollee Herter, Alferd Apa, DO [address] [place],  [place] [address]   Primary Care Provider: Carollee Herter, Alferd Apa, DO Consulting Provider: Jeral Pinch, MD   Assessment/Plan:  Peri-menopausal female with family history of breast and ovarian cancer with mildly Ca1 25 last year.  I had a long conversation with the patient about her family history as well as personal risk of gynecologic cancer.  She has multiple family members with both breast and ovarian cancer although her genetic testing was negative for BRCA or other mutation.  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  Population-based studies have shown an increased risk of breast cancer in patients with family history of breast cancer and likely an increased risk of ovarian cancer in patients with family history of ovarian cancer, both in the setting of a lack of known genetic mutation.  We discussed that I think she is at much higher risk of breast cancer than ovarian cancer.  She is currently scheduled to see Dr. Burr Medico next week to discuss breast cancer risk and risk reduction strategies.  I think it would be reasonable to move forward with risk reducing BSO, although I cautioned that in the setting of the pandemic, I would not recommend proceeding with this until our case numbers have declined.  We discussed the role of surveillance and the standard of care in patients at higher genetic susceptibility to ovarian cancer, such as in the setting of BRCA 1 and 2 mutation.  While there is not good data to support the use of pelvic ultrasound or Ca1 25, these are often the tools that we use in combination with a pelvic exam for  surveillance.  Because of her complex past medical history and multiple diagnoses related to autoimmune disorders, the patient has multiple reasons to have an elevated CA-125.  I would not recommend the use of this tumor marker in her case and less it was being used for surveillance during or after treatment for known ovarian cancer.  The patient has a strong relationship with her gynecologist and has had discussions recently about the possibility for hysterectomy.  It sounds like she is undergone counseling regarding the likelihood of resolution of some of her pain symptoms given concurrent diagnoses of fibromyalgia and interstitial cystitis.  We discussed that it would be reasonable, if she is scheduled for hysterectomy in the upcoming 6-12 months, to undergo concurrent BSO at that time.  This could be performed with her gynecologist.  All of the patient's questions and concerns were answered today.  No follow-up was scheduled but she knows she can reach out to see me or speak with me as needed.  A copy of this note was sent to the patient's referring provider.   52 minutes of total time was spent for this patient encounter, including preparation, face-to-face counseling with the patient and coordination of care, and documentation of the encounter.   Jeral Pinch, MD  Division of Gynecologic Oncology  Department of Obstetrics and Gynecology  [facility]  ___________________________________________  Chief Complaint: Chief Complaint  Patient presents with  . Elevated cancer antigen 125 (CA 125)    History  of Present Illness:  Sheila [name] is a 54 y.o. y.o. female who is seen in consultation at the request of Ann Held, * for an evaluation of family history of breast and ovarian cancer as well as recently elevated CA-125.  Patient presents today in the setting of a strong family history of breast as well as ovarian cancer.  She has undergone  myriad genetic testing, which was done in May 2020. 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  Noted to be at elevated risk of breast cancer, 41% lifetime risk.  The patient was noted to have an elevated CA-125 of 39.1 in June 2020.  At that time, pelvic ultrasound was normal.  She saw Dr.Shalowitz, GYN oncologist, at [facility].  She has a longstanding history of abnormal bleeding.  She had been on oral contraceptive pills for regulation that were stopped in early 2020.  Her Curtis in June was elevated, consistent with menopause.  She was off OCPs until September and then began having monthly bleeding that she described as heavy and associated with blood clots.  Given worsening menorrhagia, she was seen by her gynecologist again and most recently underwent hysteroscopic resection of endometrial polyp this month.  She experienced some increased bleeding after the procedure and was treated with doxycycline for possible endometritis but had to discontinue this secondary to GI side effects.  She now has had resolution with bleeding back on oral contraceptive pills.  The plan that she has discussed with her gynecologist is a several month trial on the birth control pills again, discontinuing them, and rechecking an [place].  Last saw pulm: 07/2019. Has mild persistent asthma, carrier of alpha-1-antitrypsin deficiency. Treated for Shingles in 07/2019.  PAST MEDICAL HISTORY:  Past Medical History:  Diagnosis Date  . Anal fissure   . Anxiety   . Asthma   . Chronic interstitial cystitis   . COPD (chronic obstructive pulmonary disease) ([place])   . Crohn disease ([place])   . Fibromyalgia   . Migraines   . Pneumonia 2007  . SLE (systemic lupus erythematosus) ([place])      PAST SURGICAL HISTORY:  Past Surgical History:  Procedure Laterality Date  . BLADDER SURGERY    . CESAREAN SECTION    . CYSTOSCOPY    . KNEE ARTHROSCOPY    . SALPINGECTOMY Right    right  . TONSILLECTOMY     . WRIST SURGERY      OB/GYN HISTORY:  OB History  Gravida Para Term Preterm AB Living  1 1          SAB TAB Ectopic Multiple Live Births               # Outcome Date GA Lbr Len/2nd Weight Sex Delivery Anes PTL Lv  1 Para             No LMP recorded. (Menstrual status: Oral contraceptives).  Age at menarche: 66 Age at menopause: N/A Hx of HRT: No, patient currently on oral contraceptive pills Hx of STDs: Denies Last pap: 12/2017, negative History of abnormal pap smears: no  SCREENING STUDIES:  Last mammogram: 02/2019  Last colonoscopy: 10/2018- typical adenomatous polyps, repeat in 3 years   MEDICATIONS: Outpatient Encounter Medications as of 10/13/2019  Medication Sig  . albuterol (VENTOLIN HFA) 108 (90 Base) MCG/ACT inhaler Inhale 2 puffs into the lungs every 6 (six) hours as needed for wheezing or shortness of breath.  . ALPRAZolam (XANAX) 0.5 MG  tablet TAKE 1 TABLET(0.5 MG) BY MOUTH THREE TIMES DAILY AS NEEDED  . CRYSELLE-28 0.3-30 MG-MCG tablet Take 1 tablet by mouth daily.  . Fluticasone-Salmeterol (WIXELA INHUB) 250-50 MCG/DOSE AEPB INHALE 1 PUFF INTO THE LUNGS TWICE DAILY  . tiZANidine (ZANAFLEX) 4 MG tablet TAKE 1 TABLET BY MOUTH TWICE DAILY AS NEEDED  . topiramate (TOPAMAX) 100 MG tablet TAKE 1 TABLET BY MOUTH EVERY DAY  . triamcinolone cream (KENALOG) 0.1 % Apply 1 application topically 2 (two) times daily.  . [DISCONTINUED] FLUoxetine (PROZAC) 20 MG tablet Take 1 tablet (20 mg total) by mouth daily.  . [DISCONTINUED] gabapentin (NEURONTIN) 100 MG capsule Take 1-2 capsules (100-200 mg total) by mouth at bedtime as needed.  . [DISCONTINUED] omeprazole (PRILOSEC) 40 MG capsule Take 1 capsule (40 mg total) by mouth 2 (two) times daily. Take 30-60 minutes before breakfast and dinner. (Patient not taking: Reported on 03/09/2019)  . [DISCONTINUED] traZODone (DESYREL) 50 MG tablet 1-2 po qhs  . [DISCONTINUED] triamcinolone cream (KENALOG) 0.1 % Apply 1 application topically  2 (two) times daily. (Patient not taking: Reported on 08/05/2019)  . [DISCONTINUED] valACYclovir (VALTREX) 1000 MG tablet Take 1 tablet (1,000 mg total) by mouth 3 (three) times daily.   No facility-administered encounter medications on file as of 10/13/2019.    ALLERGIES:  Allergies  Allergen Reactions  . Ampicillin   . Cymbalta [Duloxetine Hcl] Other (See Comments)    Constipation, bloating, nausea  . Doxycycline Diarrhea  . Fentanyl Citrate     REACTION: vomiting, fever  . Penicillins      FAMILY HISTORY:  Family History  Problem Relation Age of Onset  . Breast cancer Mother   . Lung cancer Father        Died in 2011/11/18  . Heart disease Father   . Breast cancer Maternal Grandmother   . Ovarian cancer Maternal Grandmother   . Breast cancer Maternal Aunt   . Prostate cancer Maternal Grandfather   . Colon cancer Maternal Grandfather   . Irritable bowel syndrome Sister   . Ovarian cancer Maternal Aunt   . Colon polyps Neg Hx   . Esophageal cancer Neg Hx   . Kidney disease Neg Hx   . Gallbladder disease Neg Hx   . Rectal cancer Neg Hx   . Stomach cancer Neg Hx      SOCIAL HISTORY:    Social Connections:   . Frequency of Communication with Friends and Family: Not on file  . Frequency of Social Gatherings with Friends and Family: Not on file  . Attends Religious Services: Not on file  . Active Member of Clubs or Organizations: Not on file  . Attends Archivist Meetings: Not on file  . Marital Status: Not on file    REVIEW OF SYSTEMS:  Positive for fatigue, unexplained weight gain, shortness of breath, nausea, pain with intercourse, joint pain, itching, rash, headaches, numbness, anxiety and depression. Denies appetite changes, fevers, chills. Denies hearing loss, neck lumps or masses, mouth sores, ringing in ears or voice changes. Denies cough or wheezing.  Denies chest pain or palpitations. Denies leg swelling. Denies abdominal distention, pain, blood in  stools, constipation, diarrhea, vomiting, or early satiety. Denies dysuria, frequency, hematuria or incontinence. Denies hot flashes, pelvic pain, vaginal bleeding or vaginal discharge.   Deniesback pain or muscle pain/cramps. Denies wounds. Denies dizziness or seizures. Denies swollen lymph nodes or glands, denies easy bruising or bleeding. Denies confusion, or decreased concentration.  Physical Exam:  Vital Signs for  this encounter:  Blood pressure 117/81, pulse 80, temperature 97.8 F (36.6 C), temperature source Temporal, resp. rate 18, height 5' 4"  (1.626 m), weight 143 lb (64.9 kg), SpO2 100 %. Body mass index is 24.55 kg/m. General: Alert, oriented, no acute distress.  HEENT: Normocephalic, atraumatic. Sclera anicteric.  Chest: Unlabored breathing on room air. Extremities: Grossly normal range of motion. Warm, well perfused. No edema bilaterally.   LABORATORY AND RADIOLOGIC DATA:  Outside medical records were reviewed to synthesize the above history, along with the history and physical obtained during the visit.   Lab Results  Component Value Date   WBC 8.6 10/13/2018   HGB 14.3 10/13/2018   HCT 42.2 10/13/2018   PLT 268.0 10/13/2018   GLUCOSE 105 (H) 10/07/2018   ALT 22 10/07/2018   AST 30 10/07/2018   NA 134 (L) 10/07/2018   K 3.6 10/07/2018   CL 108 10/07/2018   CREATININE 0.85 10/07/2018   BUN 10 10/07/2018   CO2 18 (L) 10/07/2018   TSH 0.65 02/13/2017   Pelvic ultrasound 03/2019: Uterus Measurements: 6.8 x 4.8 x 4.2 cm = volume: 71 mL. Multiple small uterine fibroids are noted, with the largest measuring 1 cm anteriorly. Endometrium Thickness: 6 mm which is within normal limits. No focal abnormality visualized. Right ovary Measurements: 2.3 x 1.7 x 1.4 cm = volume: 3 mL. 1.2 cm follicular cyst is noted. Left ovary Measurements: 1.5 x 1.0 x 0.9 cm = volume: 1 mL. Normal appearance/no adnexal mass. Pulsed Doppler evaluation of both ovaries demonstrates normal  low-resistance arterial and venous waveforms. Other findings No abnormal free fluid.  Pelvic ultrasound 09/20/2019: Right ovary 2.5 x 2.5 x 1.7 cm.  Left ovary 2.5 x 2.2 x 2.1 cm.  Endometrium thickened measuring 1.13 cm.  Right ovary with simple appearing cyst.  Left ovary with complex cyst measuring 1.6 x 1 cm, possibly resolving.  CA-125 on 03/03/19: 39  Endometrial curettings on 09/22/2019: Scant weakly secretory endometrium, no hyperplasia or malignancy.  Benign endometrial type polyp.

## 2019-10-12 ENCOUNTER — Encounter: Payer: Self-pay | Admitting: *Deleted

## 2019-10-12 ENCOUNTER — Other Ambulatory Visit: Payer: Self-pay | Admitting: Family Medicine

## 2019-10-12 DIAGNOSIS — R21 Rash and other nonspecific skin eruption: Secondary | ICD-10-CM

## 2019-10-12 MED ORDER — TRIAMCINOLONE ACETONIDE 0.1 % EX CREA: 1.0000 "application " | TOPICAL_CREAM | Freq: Two times a day (BID) | CUTANEOUS | 3 refills | Status: DC

## 2019-10-12 NOTE — Telephone Encounter (Signed)
Virtual visit today please  Cream refilled

## 2019-10-12 NOTE — Telephone Encounter (Signed)
Pt scheduled for tomorrow

## 2019-10-13 ENCOUNTER — Other Ambulatory Visit: Payer: Self-pay

## 2019-10-13 ENCOUNTER — Inpatient Hospital Stay: Payer: Federal, State, Local not specified - PPO | Attending: Gynecologic Oncology | Admitting: Gynecologic Oncology

## 2019-10-13 ENCOUNTER — Ambulatory Visit (INDEPENDENT_AMBULATORY_CARE_PROVIDER_SITE_OTHER): Payer: Federal, State, Local not specified - PPO | Admitting: Family Medicine

## 2019-10-13 ENCOUNTER — Encounter: Payer: Self-pay | Admitting: Gynecologic Oncology

## 2019-10-13 ENCOUNTER — Encounter: Payer: Self-pay | Admitting: Family Medicine

## 2019-10-13 VITALS — BP 118/70 | Ht 64.0 in | Wt 142.0 lb

## 2019-10-13 VITALS — BP 117/81 | HR 80 | Temp 97.8°F | Resp 18 | Ht 64.0 in | Wt 143.0 lb

## 2019-10-13 DIAGNOSIS — K5090 Crohn's disease, unspecified, without complications: Secondary | ICD-10-CM | POA: Insufficient documentation

## 2019-10-13 DIAGNOSIS — E8801 Alpha-1-antitrypsin deficiency: Secondary | ICD-10-CM | POA: Diagnosis not present

## 2019-10-13 DIAGNOSIS — F419 Anxiety disorder, unspecified: Secondary | ICD-10-CM | POA: Insufficient documentation

## 2019-10-13 DIAGNOSIS — J449 Chronic obstructive pulmonary disease, unspecified: Secondary | ICD-10-CM | POA: Insufficient documentation

## 2019-10-13 DIAGNOSIS — J4530 Mild persistent asthma, uncomplicated: Secondary | ICD-10-CM | POA: Diagnosis not present

## 2019-10-13 DIAGNOSIS — Z8041 Family history of malignant neoplasm of ovary: Secondary | ICD-10-CM

## 2019-10-13 DIAGNOSIS — M797 Fibromyalgia: Secondary | ICD-10-CM | POA: Insufficient documentation

## 2019-10-13 DIAGNOSIS — M329 Systemic lupus erythematosus, unspecified: Secondary | ICD-10-CM | POA: Diagnosis not present

## 2019-10-13 DIAGNOSIS — Z79899 Other long term (current) drug therapy: Secondary | ICD-10-CM | POA: Diagnosis not present

## 2019-10-13 DIAGNOSIS — Z803 Family history of malignant neoplasm of breast: Secondary | ICD-10-CM | POA: Insufficient documentation

## 2019-10-13 DIAGNOSIS — F418 Other specified anxiety disorders: Secondary | ICD-10-CM

## 2019-10-13 DIAGNOSIS — Z780 Asymptomatic menopausal state: Secondary | ICD-10-CM | POA: Diagnosis not present

## 2019-10-13 DIAGNOSIS — N3010 Interstitial cystitis (chronic) without hematuria: Secondary | ICD-10-CM | POA: Diagnosis not present

## 2019-10-13 DIAGNOSIS — R971 Elevated cancer antigen 125 [CA 125]: Secondary | ICD-10-CM

## 2019-10-13 MED ORDER — FLUOXETINE HCL 40 MG PO CAPS: 40.0000 mg | ORAL_CAPSULE | Freq: Every day | ORAL | 3 refills | Status: DC

## 2019-10-13 NOTE — Patient Instructions (Signed)
It was a pleasure meeting you today. If you have any questions that come up, please don't hesitate to contact our office: [phone].

## 2019-10-13 NOTE — Progress Notes (Signed)
Virtual Visit via Video Note  I connected with Sheila [name] on 10/13/19 at 11:20 AM EST by a video enabled telemedicine application and verified that I am speaking with the correct person using two identifiers.  Location: Patient: home  Provider: home    I discussed the limitations of evaluation and management by telemedicine and the availability of in person appointments. The patient expressed understanding and agreed to proceed.  History of Present Illness: Pt is home and c/o increase in anxiety and depression due to stress and oncology visits etc. She is considering counseling again   Observations/Objective: Vitals:   10/13/19 1114  BP: 118/70   Pt is teary and anxious She is not suicidal   Assessment and Plan: 1. Depression with anxiety con't xanax prn Inc prozac to 40 mg daily F/u 1 month or sooner prn  - FLUoxetine (PROZAC) 40 MG capsule; Take 1 capsule (40 mg total) by mouth daily.  Dispense: 90 capsule; Refill: 3   Follow Up Instructions:    I discussed the assessment and treatment plan with the patient. The patient was provided an opportunity to ask questions and all were answered. The patient agreed with the plan and demonstrated an understanding of the instructions.   The patient was advised to call back or seek an in-person evaluation if the symptoms worsen or if the condition fails to improve as anticipated.  I provided 20 minutes of non-face-to-face time during this encounter.   Ann Held, DO

## 2019-10-25 ENCOUNTER — Other Ambulatory Visit: Payer: Self-pay

## 2019-10-25 ENCOUNTER — Encounter: Payer: Self-pay | Admitting: Nurse Practitioner

## 2019-10-25 ENCOUNTER — Inpatient Hospital Stay: Payer: Federal, State, Local not specified - PPO | Attending: Gynecologic Oncology | Admitting: Nurse Practitioner

## 2019-10-25 VITALS — BP 112/78 | HR 76 | Temp 97.8°F | Resp 20 | Ht 64.0 in | Wt 146.6 lb

## 2019-10-25 DIAGNOSIS — Z9189 Other specified personal risk factors, not elsewhere classified: Secondary | ICD-10-CM

## 2019-10-25 DIAGNOSIS — M797 Fibromyalgia: Secondary | ICD-10-CM | POA: Diagnosis not present

## 2019-10-25 DIAGNOSIS — K5090 Crohn's disease, unspecified, without complications: Secondary | ICD-10-CM | POA: Insufficient documentation

## 2019-10-25 DIAGNOSIS — F419 Anxiety disorder, unspecified: Secondary | ICD-10-CM | POA: Diagnosis not present

## 2019-10-25 DIAGNOSIS — R971 Elevated cancer antigen 125 [CA 125]: Secondary | ICD-10-CM | POA: Diagnosis not present

## 2019-10-25 DIAGNOSIS — Z803 Family history of malignant neoplasm of breast: Secondary | ICD-10-CM | POA: Insufficient documentation

## 2019-10-25 DIAGNOSIS — Z1501 Genetic susceptibility to malignant neoplasm of breast: Secondary | ICD-10-CM | POA: Diagnosis not present

## 2019-10-25 DIAGNOSIS — Z79899 Other long term (current) drug therapy: Secondary | ICD-10-CM | POA: Insufficient documentation

## 2019-10-25 DIAGNOSIS — J449 Chronic obstructive pulmonary disease, unspecified: Secondary | ICD-10-CM | POA: Insufficient documentation

## 2019-10-25 DIAGNOSIS — N3010 Interstitial cystitis (chronic) without hematuria: Secondary | ICD-10-CM | POA: Insufficient documentation

## 2019-10-25 NOTE — Progress Notes (Signed)
[place]  [phone] Fax:([phone]) [place] Note   Patient Care Team: Carollee Herter, Alferd Apa, DO as PCP - General [facility] Blocker Sung Amabile, MD as Referring Physician (Internal Medicine) Domingo Pulse, MD (Urology) Milus Banister, MD as Attending Physician (Gastroenterology) Orie Rout, MD as Referring Physician (Specialist) Arvella Nigh, MD as Consulting Physician (Obstetrics and Gynecology) Juanito Doom, MD as Consulting Physician (Pulmonary Disease) Jonathon Bellows Valora Piccolo, MD (Internal Medicine) Date of Service: 10/25/2019    CHIEF COMPLAINTS/PURPOSE OF CONSULTATION:  High risk for breast cancer, referred by Dr. Arvella Nigh of Obstetrics and Gynecology   HISTORY OF PRESENTING ILLNESS:  Sheila [name] 54 y.o. perimenopausal female with past medical history of interstitial cystitis, Crohn's disease, fibromyalgia, and lupus is here because of strong family history of breast and ovarian cancer found to have personal high lifetime risk for breast cancer on genetic testing, [employer] MyRisk showing 41.1% remaining lifetime risk and a 5.3% 5-year risk. 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 She is closely followed for abnormal vaginal bleeding by Ob/GYN Dr. Radene Knee, pelvic US on 03/02/19 showed normal uterus and right ovary, left ovary with simple appearing cyst, there were no adnexal masses or pelvic free fluid. Subsequently managed with oral contraceptives. A CA 125 drawn on 03/02/19 returned slightly above upper normal (0-38) at 39.1. Repeat pelvic US on 09/20/19 showed thickened endometrium and bilateral ovarian cysts, endometrial biopsy showed scant weakly secretory endometrium, no hyperplasia or malignancy and benign endometrial type polyp. She was seen by our gyn oncologist Dr. Jeral Pinch on 10/13/19 who discussed that it is reasonable to proceed with risk  reducing BSO in the near future; patient is interested in complete hysterectomy which she would like to proceed with as soon as safely possible given the [place] pandemic. Dr. Berline Lopes also recommended against using CA 125 for screening in this setting given Ms. Kreh's autoimmune conditions which can elevate this marker. From a breast cancer standpoint, she has no concerns for new lump/mass, nipple inversion or discharge. She had a mammogram with breast MRI on 02/27/2019 that showed breast density category C and no evidence of malignancy.   GYN HISTORY  Menarchal: age 43 LMP: current abnormal uterine bleeding Contraceptive: oral OCP to manage bleeding HRT: None GP: G4P2   Socially, she is married, lives with her spouse and 2 sons ages 68 and 14. She previously worked in [employer] pl[employer]. She has been active her adult life but recently this has been limited by diffuse body pain related to fibromyalgia. She remains independent of ADLs and drives. She denies alcohol, tobacco, or drug use. She is up to date on age-appropriate cancer screenings. She is followed by Dr. Ardis Hughs of [place] for Crohn's. He has significant family history of cancer, including breast, ovarian, prostate, colon, and lung.   Today, she presents to clinic alone. She feels well in general. She watches her diet and liquid intake. meds for IC flares her Crohn's. Bowel movements are controlled currently. She is on OCP initially for migraines but more recently to control abnormal uterine bleeding, she has none currently but hopes to get off OCPs and have hysterectomy soon. She has exertional dyspnea and mild cough with occasional chest pains at baseline. Does not use inh because it makes her heart race and jittery. Her anxiety and depression are controlled, mood stable lately. She has daily generalized body pain, today rates this  6/10. She is followed by Dr. Trudie Reed in rheumatology. She developed a chest and back pruritic rash 1 weeks  ago. She tried kenalog, calamine lotion, and benadryl which have not helped. She stopped doxy (which she was on for possible endometritis 3-4 days ago and rash has gotten worse.    MEDICAL HISTORY:  Past Medical History:  Diagnosis Date  . Anal fissure   . Anxiety   . Asthma   . Chronic interstitial cystitis   . COPD (chronic obstructive pulmonary disease) ([place])   . Crohn disease ([place])   . Fibromyalgia   . Migraines   . Pneumonia 11/24/05  . SLE (systemic lupus erythematosus) ([place])     SURGICAL HISTORY: Past Surgical History:  Procedure Laterality Date  . BLADDER SURGERY    . CESAREAN SECTION    . CYSTOSCOPY    . KNEE ARTHROSCOPY    . SALPINGECTOMY Right    right  . TONSILLECTOMY    . WRIST SURGERY      SOCIAL HISTORY: Social History   Socioeconomic History  . Marital status: Married    Spouse name: Not on file  . Number of children: 2  . Years of education: Not on file  . Highest education level: Not on file  Occupational History  . Occupation: Print production planner  Tobacco Use  . Smoking status: Never Smoker  . Smokeless tobacco: Never Used  Substance and Sexual Activity  . Alcohol use: No    Alcohol/week: 0.0 standard drinks  . Drug use: No  . Sexual activity: Yes  Other Topics Concern  . Not on file  Social History Narrative  . Not on file   Social Determinants of Health   Financial Resource Strain:   . Difficulty of Paying Living Expenses: Not on file  Food Insecurity:   . Worried About Charity fundraiser in the Last Year: Not on file  . Ran Out of Food in the Last Year: Not on file  Transportation Needs:   . Lack of Transportation (Medical): Not on file  . Lack of Transportation (Non-Medical): Not on file  Physical Activity:   . Days of Exercise per Week: Not on file  . Minutes of Exercise per Session: Not on file  Stress:   . Feeling of Stress : Not on file  Social Connections:   . Frequency of Communication with Friends and Family: Not on file    . Frequency of Social Gatherings with Friends and Family: Not on file  . Attends Religious Services: Not on file  . Active Member of Clubs or Organizations: Not on file  . Attends Archivist Meetings: Not on file  . Marital Status: Not on file  Intimate Partner Violence:   . Fear of Current or Ex-Partner: Not on file  . Emotionally Abused: Not on file  . Physically Abused: Not on file  . Sexually Abused: Not on file    FAMILY HISTORY: Family History  Problem Relation Age of Onset  . Breast cancer Mother   . Lung cancer Father        Died in 2011/11/25  . Heart disease Father   . Breast cancer Maternal Grandmother   . Ovarian cancer Maternal Grandmother   . Breast cancer Maternal Aunt   . Prostate cancer Maternal Grandfather   . Colon cancer Maternal Grandfather   . Irritable bowel syndrome Sister   . Ovarian cancer Maternal Aunt   . Colon polyps Neg Hx   . Esophageal cancer Neg  Hx   . Kidney disease Neg Hx   . Gallbladder disease Neg Hx   . Rectal cancer Neg Hx   . Stomach cancer Neg Hx     ALLERGIES:  is allergic to ampicillin; cymbalta [duloxetine hcl]; doxycycline; fentanyl citrate; and penicillins.  MEDICATIONS:  Current Outpatient Medications  Medication Sig Dispense Refill  . ALPRAZolam (XANAX) 0.5 MG tablet TAKE 1 TABLET(0.5 MG) BY MOUTH THREE TIMES DAILY AS NEEDED 90 tablet 0  . CRYSELLE-28 0.3-30 MG-MCG tablet Take 1 tablet by mouth daily.    Marland Kitchen FLUoxetine (PROZAC) 40 MG capsule Take 1 capsule (40 mg total) by mouth daily. 90 capsule 3  . Fluticasone-Salmeterol (WIXELA INHUB) 250-50 MCG/DOSE AEPB INHALE 1 PUFF INTO THE LUNGS TWICE DAILY 60 each 5  . tiZANidine (ZANAFLEX) 4 MG tablet TAKE 1 TABLET BY MOUTH TWICE DAILY AS NEEDED 60 tablet 1  . topiramate (TOPAMAX) 100 MG tablet TAKE 1 TABLET BY MOUTH EVERY DAY 90 tablet 1  . triamcinolone cream (KENALOG) 0.1 % Apply 1 application topically 2 (two) times daily. 45 g 3  . albuterol (VENTOLIN HFA) 108 (90 Base)  MCG/ACT inhaler Inhale 2 puffs into the lungs every 6 (six) hours as needed for wheezing or shortness of breath. 6.7 g 6   No current facility-administered medications for this visit.    REVIEW OF SYSTEMS:   Constitutional: Denies fevers, chills or abnormal night sweats Eyes: Denies blurriness of vision, double vision or watery eyes Ears, nose, mouth, throat, and face: Denies mucositis or sore throat Respiratory: Denies wheezes (+) mild cough (+) DOE, stable  Cardiovascular: Denies palpitation or lower extremity swelling (+) Intermittent chest pain  Gastrointestinal:  Denies nausea, heartburn or change in bowel habits (+) Crohn's, in remission  Skin: (+) abnormal skin rash for 1 week (+) itching  Lymphatics: Denies new lymphadenopathy or easy bruising Neurological:Denies numbness, tingling or new weaknesses MSK: (+) diffuse joint pain, mostly in knees  Behavioral/Psych: Mood is stable, no new changes (+) anxiety, depression mostly controlled  All other systems were reviewed with the patient and are negative.  PHYSICAL EXAMINATION: ECOG PERFORMANCE STATUS: 1 - Symptomatic but completely ambulatory  Vitals:   10/25/19 1332  BP: 112/78  Pulse: 76  Resp: 20  Temp: 97.8 F (36.6 C)  SpO2: 99%   Filed Weights   10/25/19 1332  Weight: 146 lb 9.6 oz (66.5 kg)    GENERAL:alert, no distress and comfortable SKIN: moderate erythematous maculopapular eruption to chest, mild on back  EYES: sclera clear NECK: without mass LYMPH:  no palpable cervical or supraclavicular lymphadenopathy  LUNGS: clear with normal breathing effort, no wheezing  HEART: regular rate & rhythm, no lower extremity edema ABDOMEN: abdomen soft, non-tender and normal bowel sounds Musculoskeletal: no joint swelling PSYCH: alert & oriented x 3 with fluent speech NEURO: no focal motor/sensory deficits Breast exam: no nipple discharge or inversion, no palpable mass in either breast or adenopathy that I could  appreciate  LABORATORY DATA:  I have reviewed the data as listed CBC Latest Ref Rng & Units 10/13/2018 10/07/2018 02/13/2017  WBC 4.0 - 10.5 K/uL 8.6 11.0(H) 7.6  Hemoglobin 12.0 - 15.0 g/dL 14.3 14.5 14.4  Hematocrit 36.0 - 46.0 % 42.2 45.2 43.4  Platelets 150.0 - 400.0 K/uL 268.0 299 293.0   CMP Latest Ref Rng & Units 10/07/2018 02/13/2017 11/22/2014  Glucose 70 - 99 mg/dL 105(H) 88 105(H)  BUN 6 - 20 mg/dL 10 14 9   Creatinine 0.44 - 1.00 mg/dL 0.85 0.95  1.08  Sodium 135 - 145 mmol/L 134(L) 139 139  Potassium 3.5 - 5.1 mmol/L 3.6 4.0 3.6  Chloride 98 - 111 mmol/L 108 108 109  CO2 22 - 32 mmol/L 18(L) 26 25  Calcium 8.9 - 10.3 mg/dL 8.7(L) 9.2 9.1  Total Protein 6.5 - 8.1 g/dL 7.6 7.2 7.3  Total Bilirubin 0.3 - 1.2 mg/dL 0.5 0.3 0.3  Alkaline Phos 38 - 126 U/L 73 50 47  AST 15 - 41 U/L 30 28 18   ALT 0 - 44 U/L 22 28 17     RADIOGRAPHIC STUDIES: I have personally reviewed the radiological images as listed and agreed with the findings in the report. No results found.  ASSESSMENT & PLAN: 54 yo perimenopausal woman   1. Personal high lifetime breast cancer risk - 41% with 5.3% 5-year breast cancer risk  -We reviewed her medical record, family history, and genetic testing in great detail -we discussed risk reducing behaviors such as maintaining a healthy diet and weight, exercise, continue abstaining from smoking, and limiting alcohol.  -Given her high lifetime risk and breast density category C, she would benefit from annual screening breast MRI in addition to mammogram (due in 5/21) which I recommend for her to do 6 months apart from the other, and to continue q6-12 month breast exam. She agrees. Anticipate MRI to be done in 07/2020 -She asked if there was a blood test, we discussed the role of certain breast tumor markers, such as CA 27.29, which is not recommended for screening purposes, and could be falsely elevated in her case due to her existing medical conditions. I am not  recommending this test  -we discussed chemoprevention with tamoxifen vs raloxifine or AI. Given she is perimenopausal, her only option at this point is tamoxifen. The potential benefit of reducing her breast cancer risk by nearly 50%, and side effects including but not limited to hot flash, vaginal discharge (Tamoxifen) or dryness (AI), metabolic changes ( increased blood glucose, cholesterol, weight, etc.), slightly increased risk of cardiovascular disease, cataracts, muscular and joint discomfort were discussed with her. Tamoxifen has bone strengthening qualities and thus does not contribute to osteopenia and osteoporosis. I did review the risk of endometrial cancer and thrombosis on Tamoxifen which understandably makes her nervous. She tends to avoid medication in general due to side effects but she is interested in chemoprevention and agrees to try tamoxifen.  -She wants to come off OCP and undergo hysterectomy/BSO as soon as safely possible given the COVID19 situation. If she has surgery in the next 3 months, then I would recommend to hold off on tamoxifen and start AI after surgery, which is slightly more effective. If she will not have surgery soon, then we will go ahead and start Tamoxifen. I do recommend she hold tamoxifen for 3-4 weeks prior to surgery given the risk for thrombosis. She agrees.  -I will send my note to Dr. Radene Knee to discuss his timeline for surgery -If she proceeds with surgery in the next few months, we will see her back after she recovers to discuss AI.  -F/u open, pending surgical plan  2. Genetics -  3. Family h/o GYN malignancies  -followed closely by Dr. Radene Knee, Ob[facility] at [facility]  -perimenopausal, s/p recent hysteroscopy, path showed endometrial polyp; currently maintained on OCP for abnormal uterine bleeding -Seen by our gyn oncologist Dr. Berline Lopes who is recommending risk reducing BSO at time of patient's elective hysterectomy which is not currently  scheduled yet -Dr. Berline Lopes is  also recommending against using CA 125 as a screening tool for GYN malignancies in this patient with autoimmune conditions as an elevated CA 125 is nonspecific   4. Upper chest and back skin eruption  -Developed 1 week ago, maculopapular and erythematous   -Not responding well to triamcinolone or benadryl cream -she was on doxy in the last couple weeks after endometrial biopsy for possible endometritis, the rash did erupt while she was on antibiotics but has worsened since she stopped taking it.  -Etiology unclear, not sure if this represents drug rash from antibiotic or cutaneous lupus eruption, I recommend for Ms. Verley to contact Dr. Trudie Reed office. I will also send her my note.  5. Crohn's, interstitial cystitis, fibromyalgia, SLE -Continue f/u with GI, Dr. Ardis Hughs, UTD on colonoscopy  -f/u pulm, PCP, and rheumatology Dr. Gavin Pound    PLAN: -Labs, medical record, imaging, family history, and genetic report reviewed -Recommending risk reducing behaviors and tamoxifen for chemoprevention if no TAH/BSO in the next 3 months, if she has surgery soon, will hold off and see her back after surgery to discuss starting AI -Recommending annual screening MRI alternating with mammogram (6 months apart, mammo in 5/21, MRI in 11/21 and so forth)  -Forward notes to Drs. McComb and Hawkes -F/u open, pending surgical timeline    No orders of the defined types were placed in this encounter.   All questions were answered. The patient knows to call the clinic with any problems, questions or concerns. I spent 45 minutes ounseling the patient face to face. The total time spent in the appointment was 60 minutes and more than 50% was on counseling, review of test results, and coordination of care.     Alla Feeling, NP 10/29/2019

## 2019-10-29 ENCOUNTER — Telehealth: Payer: Self-pay

## 2019-10-29 ENCOUNTER — Encounter: Payer: Self-pay | Admitting: Nurse Practitioner

## 2019-10-29 NOTE — Telephone Encounter (Signed)
PROGRESS NOTE:  Per Cira Rue NP  Faxed her note from 10/25/19 to Dr. Radene Knee and asked him to indicate his plan for hysterectomy/BSO. If 3+ months away, [name] will start her on chemoprevention with tamoxifen now. If he plans to move forward with hyst/BSO in the next 1-3 months, She will see her after surgery to start AI.  Phone# [phone] Fax# [phone]  Also faxed her note from 10/25/19 to Dr. Gavin Pound about her skin rash. Not sure what it is, but given she has lupus, want to r/o cutaneous lupus eruption.  Phone# [phone] Fax# [phone]  Both fax came back as complete and placed in the bin beside nurse desk

## 2019-11-19 ENCOUNTER — Encounter: Payer: Self-pay | Admitting: Pulmonary Disease

## 2019-11-19 ENCOUNTER — Other Ambulatory Visit: Payer: Self-pay

## 2019-11-19 ENCOUNTER — Ambulatory Visit: Payer: Federal, State, Local not specified - PPO | Admitting: Pulmonary Disease

## 2019-11-19 VITALS — BP 110/70 | HR 77 | Temp 98.2°F | Ht 64.0 in | Wt 141.0 lb

## 2019-11-19 DIAGNOSIS — F418 Other specified anxiety disorders: Secondary | ICD-10-CM

## 2019-11-19 DIAGNOSIS — L930 Discoid lupus erythematosus: Secondary | ICD-10-CM

## 2019-11-19 DIAGNOSIS — R040 Epistaxis: Secondary | ICD-10-CM | POA: Diagnosis not present

## 2019-11-19 DIAGNOSIS — J4530 Mild persistent asthma, uncomplicated: Secondary | ICD-10-CM | POA: Diagnosis not present

## 2019-11-19 MED ORDER — AZITHROMYCIN 250 MG PO TABS: ORAL_TABLET | ORAL | 0 refills | Status: DC

## 2019-11-19 NOTE — Assessment & Plan Note (Signed)
Patient's depression anxiety continue to complicate patient's follow-up with providers Patient reports that she did not follow-up with our office or primary care for the nosebleeds static concerns for COVID-19  Plan: I recommend the patient follow-up with primary care Emphasized the importance that she keep regular follow-up with all of her providers

## 2019-11-19 NOTE — Progress Notes (Signed)
@Patient  ID: Sheila [name], female    DOB: September 05, 1966, 53 y.o.   MRN: [id number]  Chief Complaint  Patient presents with  . Follow-up    Per patient, increased fatigue and SOB. States she can not use the albuterol due to increased HR. Increased nose bleeds and coughing up small amounts of blood.     Referring provider: Ann Held, *  HPI:  54 year old female never smoker followed in our office for suspected asthma, dyspnea on exertion, laryngeal reflux, voice hoarseness, alpha-1 antitrypsin deficiency carrier state with normal alpha-1 levels  PMH: Hypothyroidism, lupus (managed by Dr. Trudie Reed), anxiety, depression, migraines, fibromyalgia Smoker/ Smoking History: Never smoker Maintenance: Wixela Pt of: Dr. Carlis Abbott  11/19/2019  - Visit   54 year old female never smoker followed in our office for dyspnea on exertion possible asthma.  At last office visit in November/2020 patient was established with Dr. Carlis Abbott.  At that time patient did not feel that her inhalers were working for her.  So she requested be trialed off of her Wixela inhaler.  Patient presents today as an acute visit for worsening shortness of breath.  Patient also reports that she is having tachycardia, and persistent nosebleeds 4 times a week for over a year.  Patient reports that the "blood is pouring out" of her nose. she has not contacted our office regarding any of the symptoms.  It also does not look like she is followed up with primary care regarding the symptoms.  Last time she was seen in person by primary care regarding her nosebleeds was September/2020 when she was referred to ENT.  She reports that nobody ever called her to schedule this appointment.  Patient on her own accord resumed her Wixela inhaler 6 weeks ago.  She still is having worsening shortness of breath.  She continues to have coughing spells with intermittent wheezing and the cough is productive with white mucus.  Patient also reporting  persistent fatigue and "pain all over".  Tests:   01/14/2018-high-res CT chest-no evidence of interstitial lung disease, solitary subpleural 3 mm solid right middle lobe pulmonary nodule and almost certainly benign  03/24/2955-[id number] function test-FVC 3.47 (98% predicted), postbronchodilator ratio 60, postbronchodilator FEV1 2.23 (79% predicted), no bronchodilator response, DLCO 24.66 (101% predicted)  FENO:  No results found for: NITRICOXIDE  PFT: PFT Results Latest Ref Rng & Units 11/20/2017  FVC-Pre L 3.47  FVC-Predicted Pre % 98  FVC-Post L 3.72  FVC-Predicted Post % 105  Pre FEV1/FVC % % 63  Post FEV1/FCV % % 60  FEV1-Pre L 2.17  FEV1-Predicted Pre % 77  FEV1-Post L 2.23  DLCO UNC% % 101  DLCO COR %Predicted % 104  TLC L 5.89  TLC % Predicted % 116  RV % Predicted % 139    WALK:  No flowsheet data found.  Imaging: No results found.  Lab Results:  CBC    Component Value Date/Time   WBC 8.6 10/13/2018 1510   RBC 4.57 10/13/2018 1510   HGB 14.3 10/13/2018 1510   HCT 42.2 10/13/2018 1510   PLT 268.0 10/13/2018 1510   MCV 92.3 10/13/2018 1510   MCH 30.3 10/07/2018 1643   MCHC 34.0 10/13/2018 1510   RDW 13.1 10/13/2018 1510   LYMPHSABS 2.9 10/13/2018 1510   MONOABS 0.8 10/13/2018 1510   EOSABS 0.2 10/13/2018 1510   BASOSABS 0.1 10/13/2018 1510    BMET    Component Value Date/Time   NA 134 (L) 10/07/2018 1643   K 3.6  10/07/2018 1643   CL 108 10/07/2018 1643   CO2 18 (L) 10/07/2018 1643   GLUCOSE 105 (H) 10/07/2018 1643   BUN 10 10/07/2018 1643   CREATININE 0.85 10/07/2018 1643   CALCIUM 8.7 (L) 10/07/2018 1643   GFRNONAA >60 10/07/2018 1643   GFRAA >60 10/07/2018 1643    BNP No results found for: BNP  ProBNP No results found for: PROBNP  Specialty Problems      Pulmonary Problems   Reflux laryngitis   Mild persistent asthma without complication   Epistaxis      Allergies  Allergen Reactions  . Ampicillin   . Cymbalta [Duloxetine Hcl]  Other (See Comments)    Constipation, bloating, nausea  . Doxycycline Diarrhea  . Fentanyl Citrate     REACTION: vomiting, fever  . Penicillins     Immunization History  Administered Date(s) Administered  . Influenza Whole 08/04/2007, 08/29/2011, 07/29/2012  . Influenza,inj,Quad PF,6+ Mos 07/11/2015, 05/29/2018, 06/11/2019  . Pneumococcal Polysaccharide-23 05/29/2018  . Td 06/21/2010    Past Medical History:  Diagnosis Date  . Anal fissure   . Anxiety   . Asthma   . Chronic interstitial cystitis   . COPD (chronic obstructive pulmonary disease) ([place])   . Crohn disease ([place])   . Fibromyalgia   . Migraines   . Pneumonia 2007  . SLE (systemic lupus erythematosus) ([facility])     Tobacco History: Social History   Tobacco Use  Smoking Status Never Smoker  Smokeless Tobacco Never Used   Counseling given: Not Answered   Continue to not smoke  Outpatient Encounter Medications as of 11/19/2019  Medication Sig  . albuterol (VENTOLIN HFA) 108 (90 Base) MCG/ACT inhaler Inhale 2 puffs into the lungs every 6 (six) hours as needed for wheezing or shortness of breath.  . ALPRAZolam (XANAX) 0.5 MG tablet TAKE 1 TABLET(0.5 MG) BY MOUTH THREE TIMES DAILY AS NEEDED  . CRYSELLE-28 0.3-30 MG-MCG tablet Take 1 tablet by mouth daily.  Marland Kitchen FLUoxetine (PROZAC) 40 MG capsule Take 1 capsule (40 mg total) by mouth daily.  . Fluticasone-Salmeterol (WIXELA INHUB) 250-50 MCG/DOSE AEPB INHALE 1 PUFF INTO THE LUNGS TWICE DAILY  . tiZANidine (ZANAFLEX) 4 MG tablet TAKE 1 TABLET BY MOUTH TWICE DAILY AS NEEDED  . topiramate (TOPAMAX) 100 MG tablet TAKE 1 TABLET BY MOUTH EVERY DAY  . triamcinolone cream (KENALOG) 0.1 % Apply 1 application topically 2 (two) times daily.  Marland Kitchen azithromycin (ZITHROMAX) 250 MG tablet 560m (two tablets) today, then 2564m(1 tablet) for the next 4 days   No facility-administered encounter medications on file as of 11/19/2019.     Review of Systems  Review of Systems    Constitutional: Positive for fatigue. Negative for activity change and fever.  HENT: Positive for nosebleeds, postnasal drip and voice change. Negative for sinus pressure, sinus pain and sore throat.   Respiratory: Positive for cough, shortness of breath and wheezing.   Cardiovascular: Negative for chest pain and palpitations.  Gastrointestinal: Negative for diarrhea, nausea and vomiting.  Musculoskeletal: Negative for arthralgias.  Neurological: Negative for dizziness.  Psychiatric/Behavioral: Positive for dysphoric mood. Negative for sleep disturbance. The patient is nervous/anxious and is hyperactive.      Physical Exam  BP 110/70   Pulse 77   Temp 98.2 F (36.8 C) (Temporal)   Ht 5' 4"  (1.626 m)   Wt 141 lb (64 kg)   SpO2 98% Comment: on RA  BMI 24.20 kg/m   Wt Readings from Last 5 Encounters:  11/19/19  141 lb (64 kg)  10/25/19 146 lb 9.6 oz (66.5 kg)  10/13/19 142 lb (64.4 kg)  10/13/19 143 lb (64.9 kg)  08/06/19 143 lb 4 oz (65 kg)    BMI Readings from Last 5 Encounters:  11/19/19 24.20 kg/m  10/25/19 25.16 kg/m  10/13/19 24.37 kg/m  10/13/19 24.55 kg/m  08/06/19 24.59 kg/m     Physical Exam Vitals and nursing note reviewed.  Constitutional:      General: She is not in acute distress.    Appearance: Normal appearance. She is normal weight.     Comments: Adult female  HENT:     Head: Normocephalic and atraumatic.     Right Ear: External ear normal.     Left Ear: External ear normal.     Nose: Rhinorrhea present. No congestion.     Comments: Left nare, healing nasal mucosa    Mouth/Throat:     Mouth: Mucous membranes are moist.     Pharynx: Oropharynx is clear.     Comments: Postnasal drip Eyes:     Pupils: Pupils are equal, round, and reactive to light.  Cardiovascular:     Rate and Rhythm: Normal rate and regular rhythm.     Pulses: Normal pulses.     Heart sounds: Normal heart sounds. No murmur.  Pulmonary:     Effort: Pulmonary effort is  normal. No respiratory distress.     Breath sounds: Normal breath sounds. No decreased air movement. No decreased breath sounds, wheezing or rales.  Abdominal:     General: Abdomen is flat. Bowel sounds are normal.     Palpations: Abdomen is soft.  Musculoskeletal:     Cervical back: Normal range of motion.  Skin:    General: Skin is warm and dry.     Capillary Refill: Capillary refill takes less than 2 seconds.  Neurological:     General: No focal deficit present.     Mental Status: She is alert and oriented to person, place, and time. Mental status is at baseline.     Gait: Gait normal.  Psychiatric:        Mood and Affect: Mood is anxious. Affect is tearful.        Speech: Speech is rapid and pressured.        Behavior: Behavior is hyperactive.        Thought Content: Thought content normal.        Judgment: Judgment normal.       Assessment & Plan:   Unsure what to make of patient's myriad of symptoms today.  Patient continues to report "pain all over" this is likely directly related to her chronic finding of fibromyalgia.  I have encouraged her to follow back up with primary care regarding management of anxiety as well as depression.  I do think she made the right decision to go back on her maintenance inhaler Wixela.  I emphasized repeatedly multiple times throughout this visit that primary care, and patient specialists will not be able to help the patient if she does not have follow-up with [place] as well as communicate with [place] when she is having acute symptoms.  I explained to her multiple times that I find it concerning that she has had nosebleeds 4 times a week for the last year per the patient.  She is well aware after today that she needs to follow-up with primary care if she has not heard back about referrals that have been offered to her.  Her anxiety  continues to be a barrier with this.  Patient is even resistant with obtaining lab work due to concerns with being exposed to  Covid.  She will return to our office next week to obtain baseline lab work.  I have referred her to ear nose and throat today.   Epistaxis Previously evaluated by primary care on 05/2019 for evaluation of epistasis Patient was referred to ENT at that time Patient never followed back up with primary care or ENT after referral Patient reporting she has had 1 year of nosebleeds at least 4 times a week where blood is "pouring out of her nose" Healing nasal mucosa on left nare No active bleeding on exam today  Plan: Referral to ear nose and throat for further evaluation Encourage patient to follow-up with primary care If patient has a nosebleed again she needs to contact primary care for in person evaluation or present to an urgent care or an emergency room Start nasal saline rinses 1-2 times daily  LUPUS ERYTHEMATOSUS Plan: Continue follow-up with [facility] rheumatology  Depression with anxiety Patient's depression anxiety continue to complicate patient's follow-up with providers Patient reports that she did not follow-up with our office or primary care for the nosebleeds static concerns for COVID-19  Plan: I recommend the patient follow-up with primary care Emphasized the importance that she keep regular follow-up with all of her providers  Mild persistent asthma without complication Plan: Z-Pak today Continue Wixela Start nasal saline rinses 1-2 times daily Follow-up in 4 weeks     Return in about 4 weeks (around 12/17/2019), or if symptoms worsen or fail to improve, for Follow up with Dr. Carlis Abbott.   Lauraine Rinne, NP 11/19/2019   This appointment required 35 minutes of patient care (this includes precharting, chart review, review of results, face-to-face care, etc.).

## 2019-11-19 NOTE — Assessment & Plan Note (Addendum)
Previously evaluated by primary care on 05/2019 for evaluation of epistasis Patient was referred to ENT at that time Patient never followed back up with primary care or ENT after referral Patient reporting she has had 1 year of nosebleeds at least 4 times a week where blood is "pouring out of her nose" Healing nasal mucosa on left nare No active bleeding on exam today  Plan: Labwork ordered Referral to ear nose and throat for further evaluation Encourage patient to follow-up with primary care If patient has a nosebleed again she needs to contact primary care for in person evaluation or present to an urgent care or an emergency room Start nasal saline rinses 1-2 times daily

## 2019-11-19 NOTE — Patient Instructions (Addendum)
You were seen today by Lauraine Rinne, NP  for:   I am glad you came in today.  I am glad you restarted Wixela on your own.  I am hopeful that this will be better for your breathing.  We will need to have close follow-up with you to ensure your symptoms are improving.  You also need to schedule follow-up with primary care.  I would recommend this be in person.  We have also referred you to ear nose and throat at [facility]  Please contact our office if you have not been contacted by them to be scheduled for a consult appointment.  If you have persistent nosebleeds or you are coughing up blood then you will need to seek emergent evaluation in urgent care, emergency room or at least contact primary care in our office for further recommendations.  Take care and stay safe,  Dequita Schleicher  1. History of Recurrent Epistaxis  - Ambulatory referral to ENT  Start nasal saline rinses 1-2 times daily Use distilled water Get water lukewarm like a baby bottle Shake well    2. Mild persistent asthma without complication  Continue Wixella   Only use your albuterol as a rescue medication to be used if you can't catch your breath by resting or doing a relaxed purse lip breathing pattern.  - The less you use it, the better it will work when you need it. - Ok to use up to 2 puffs  every 4 hours if you must but call for immediate appointment if use goes up over your usual need - Don't leave home without it !!  (think of it like the spare tire for your car)   3. Lupus erythematosus, unspecified form  Continue follow up with Rheumatology    We recommend today:  Orders Placed This Encounter  Procedures  . Ambulatory referral to ENT    Referral Priority:   Urgent    Referral Type:   Consultation    Referral Reason:   Specialty Services Required    Requested Specialty:   Otolaryngology    Number of Visits Requested:   1   Orders Placed This Encounter  Procedures  . Ambulatory referral to ENT   Meds  ordered this encounter  Medications  . azithromycin (ZITHROMAX) 250 MG tablet    Sig: 540m (two tablets) today, then 2588m(1 tablet) for the next 4 days    Dispense:  6 tablet    Refill:  0    Follow Up:    Return in about 4 weeks (around 12/17/2019), or if symptoms worsen or fail to improve, for Follow up with Dr. ClCarlis Abbott  Please do your part to reduce the spread of COVID-19:      Reduce your risk of any infection  and COVID19 by using the similar precautions used for avoiding the common cold or flu:  . Marland Kitchenash your hands often with soap and warm water for at least 20 seconds.  If soap and water are not readily available, use an alcohol-based hand sanitizer with at least 60% alcohol.  . If coughing or sneezing, cover your mouth and nose by coughing or sneezing into the elbow areas of your shirt or coat, into a tissue or into your sleeve (not your hands). . Langley Gauss MASK when in public  . Avoid shaking hands with others and consider head nods or verbal greetings only. . Avoid touching your eyes, nose, or mouth with unwashed hands.  . Avoid close  contact with people who are sick. . Avoid places or events with large numbers of people in one location, like concerts or sporting events. . If you have some symptoms but not all symptoms, continue to monitor at home and seek medical attention if your symptoms worsen. . If you are having a medical emergency, call 911.   [place] / e-Visit: [email or web address]         [facility]: [facility]: [phone]                   [facility]: [phone]     It is flu season:   >>> Best ways to protect herself from the flu: Receive the yearly flu vaccine, practice good hand hygiene washing with soap and also using hand sanitizer when available, eat a nutritious meals, get adequate rest,  hydrate appropriately   Please contact the office if your symptoms worsen or you have concerns that you are not improving.   Thank you for choosing [facility] for your healthcare, and for allowing [place] to partner with you on your healthcare journey. I am thankful to be able to provide care to you today.   Wyn Quaker FNP-C

## 2019-11-19 NOTE — Assessment & Plan Note (Signed)
Plan: Z-Pak today Continue Wixela Start nasal saline rinses 1-2 times daily Follow-up in 4 weeks

## 2019-11-19 NOTE — Assessment & Plan Note (Signed)
Plan: Continue follow-up with [facility] rheumatology

## 2019-11-22 ENCOUNTER — Other Ambulatory Visit (INDEPENDENT_AMBULATORY_CARE_PROVIDER_SITE_OTHER): Payer: Federal, State, Local not specified - PPO

## 2019-11-22 DIAGNOSIS — R040 Epistaxis: Secondary | ICD-10-CM | POA: Diagnosis not present

## 2019-11-22 LAB — COMPREHENSIVE METABOLIC PANEL
ALT: 12 U/L (ref 0–35)
AST: 16 U/L (ref 0–37)
Albumin: 3.9 g/dL (ref 3.5–5.2)
Alkaline Phosphatase: 71 U/L (ref 39–117)
BUN: 10 mg/dL (ref 6–23)
CO2: 21 mEq/L (ref 19–32)
Calcium: 9.1 mg/dL (ref 8.4–10.5)
Chloride: 106 mEq/L (ref 96–112)
Creatinine, Ser: 0.88 mg/dL (ref 0.40–1.20)
GFR: 67.57 mL/min (ref >60.00)
Glucose, Bld: 131 mg/dL — ABNORMAL HIGH (ref 70–99)
Potassium: 3.7 mEq/L (ref 3.5–5.1)
Sodium: 135 mEq/L (ref 135–145)
Total Bilirubin: 0.5 mg/dL (ref 0.2–1.2)
Total Protein: 7.4 g/dL (ref 6.0–8.3)

## 2019-11-22 LAB — CBC WITH DIFFERENTIAL/PLATELET
Basophils Absolute: 0 10*3/uL (ref 0.0–0.1)
Basophils Relative: 0.6 % (ref 0.0–3.0)
Eosinophils Absolute: 0.1 10*3/uL (ref 0.0–0.7)
Eosinophils Relative: 1.6 % (ref 0.0–5.0)
HCT: 40.9 % (ref 36.0–46.0)
Hemoglobin: 13.8 g/dL (ref 12.0–15.0)
Lymphocytes Relative: 24.1 % (ref 12.0–46.0)
Lymphs Abs: 1.9 10*3/uL (ref 0.7–4.0)
MCHC: 33.7 g/dL (ref 30.0–36.0)
MCV: 93.6 fl (ref 78.0–100.0)
Monocytes Absolute: 0.6 10*3/uL (ref 0.1–1.0)
Monocytes Relative: 7.7 % (ref 3.0–12.0)
Neutro Abs: 5.1 10*3/uL (ref 1.4–7.7)
Neutrophils Relative %: 66 % (ref 43.0–77.0)
Platelets: 317 10*3/uL (ref 150.0–400.0)
RBC: 4.37 Mil/uL (ref 3.87–5.11)
RDW: 13.8 % (ref 11.5–15.5)
WBC: 7.8 10*3/uL (ref 4.0–10.5)

## 2019-11-23 ENCOUNTER — Ambulatory Visit: Payer: Federal, State, Local not specified - PPO | Admitting: Critical Care Medicine

## 2019-11-23 NOTE — Progress Notes (Signed)
Agree.  Julian Hy, DO 11/23/19 6:00 PM [facility]

## 2019-11-30 DIAGNOSIS — R040 Epistaxis: Secondary | ICD-10-CM | POA: Diagnosis not present

## 2019-11-30 DIAGNOSIS — R042 Hemoptysis: Secondary | ICD-10-CM | POA: Diagnosis not present

## 2019-12-01 DIAGNOSIS — R971 Elevated cancer antigen 125 [CA 125]: Secondary | ICD-10-CM | POA: Diagnosis not present

## 2019-12-01 DIAGNOSIS — N939 Abnormal uterine and vaginal bleeding, unspecified: Secondary | ICD-10-CM | POA: Diagnosis not present

## 2019-12-01 DIAGNOSIS — Z9189 Other specified personal risk factors, not elsewhere classified: Secondary | ICD-10-CM | POA: Diagnosis not present

## 2019-12-01 DIAGNOSIS — K5090 Crohn's disease, unspecified, without complications: Secondary | ICD-10-CM | POA: Diagnosis not present

## 2019-12-24 ENCOUNTER — Encounter: Payer: Self-pay | Admitting: Critical Care Medicine

## 2019-12-24 ENCOUNTER — Ambulatory Visit (INDEPENDENT_AMBULATORY_CARE_PROVIDER_SITE_OTHER): Payer: Federal, State, Local not specified - PPO | Admitting: Critical Care Medicine

## 2019-12-24 ENCOUNTER — Other Ambulatory Visit: Payer: Self-pay

## 2019-12-24 VITALS — BP 110/68 | HR 87 | Temp 98.4°F | Ht 64.0 in | Wt 144.0 lb

## 2019-12-24 DIAGNOSIS — J4530 Mild persistent asthma, uncomplicated: Secondary | ICD-10-CM

## 2019-12-24 DIAGNOSIS — R5382 Chronic fatigue, unspecified: Secondary | ICD-10-CM

## 2019-12-24 DIAGNOSIS — M329 Systemic lupus erythematosus, unspecified: Secondary | ICD-10-CM

## 2019-12-24 DIAGNOSIS — R0600 Dyspnea, unspecified: Secondary | ICD-10-CM

## 2019-12-24 NOTE — Patient Instructions (Addendum)
Thank you for visiting Dr. Carlis Abbott at [facility].  We recommend the following: Orders Placed This Encounter  Procedures  . Pulmonary function test  . Split night study   Orders Placed This Encounter  Procedures  . Pulmonary function test    Standing Status:   Future    Standing Expiration Date:   12/23/2020    Order Specific Question:   Where should this test be performed?    Answer:   [facility]    Order Specific Question:   Full PFT: includes the following: basic spirometry, spirometry pre & post bronchodilator, diffusion capacity (DLCO), lung volumes    Answer:   Full PFT  . Split night study    Standing Status:   Future    Standing Expiration Date:   12/23/2020    Order Specific Question:   Where should this test be performed:    Answer:   [facility]    Continue with Wixella twice daily. Rinse your mouth after every use. Use your Wixella the morning before surgery.    Return in about 2 months (around 02/23/2020).    Please do your part to reduce the spread of COVID-19.

## 2019-12-24 NOTE — Progress Notes (Signed)
Synopsis: Referred in March 2019 for persistent asthma by Carollee Herter, Alferd Apa, *.  Previously patient of Dr. Lake Bells.  Subjective:   PATIENT ID: Sheila [name] GENDER: female DOB: [birth year], MRN: [id number]  Chief Complaint  Patient presents with  . Follow-up    Patient went and saw ENT and they didn't find anything out of the norm and patient is still having bloody noses and had one last night. Patient has shortness of breath all the time but is worth with exertion. Patient states she just feels winded all the time.     Sheila [name] is a 54 year old woman with a history of multiple rheumatologic conditions (Crohn's, SLE, interstitial cystitis), fibromyalgia, and asthma who presents for follow-up.  She was diagnosed with asthma when she began having dyspnea after a flu infection in 2019.  Since her last visit her symptoms are at her baseline, which have been unchanged for the last 2.5 years.  She has persistent shortness of breath that does not respond to inhalers.  When she has used albuterol for severe shortness of breath she notices tachycardia.  She feels short of breath with walking and talking.  No wheezing, cough, sputum production.  Her main complaints are feeling exhausted, winded, fatigued, and strained.  She is not sleeping through the night due to shortness of breath.  She feels like there is pressure on her chest that prohibits her from taking a deep breath in or out.  This is relieved by sitting up, but does not resolve.  She has the symptom all the time, even during the day.  This sensation limits her from able to go to sleep, extreme exhaustion is what eventually helps her sleep.  She has tried sleeping in multiple different positions.  She thinks she snores, but has never been told that she stopped breathing while she was asleep.  She required prednisone and azithromycin twice in the last year for uncontrolled symptoms- 02/2019 and 11/19/2019.  She had stopped Wixela last fall, but due  to recurrence of symptoms had restarted Wixela at the recommendation of her primary care physician during the winter.  She has had ongoing vaginal hemorrhaging and has a total hysterectomy planned for May 10.  It may be scheduled sooner if there are openings on the surgery schedule.  Since she was seen by Dr. Erik Obey with [facility] on 11/29/19 (note reviewed) she has only had 5 nosebleeds.  During their evaluation they did not find an explanation for the degree of epistaxis that she described; only an area of excoriation, for which she was prescribed Bactroban ointment. Of note, during that visit she mentioned episodes of hemoptysis that have been going on for about 10 months, which have never been brought to our attention.  Rheumatologist Dr. Lenna Gilford 07/12/2019 note reviewed.  At that time, no role for immunosuppression based on quiesced sent Crohn's disease and lack of synovitis based on x-rays.      OV 08/05/19: Sheila [name] is a 54 year old woman with a history of SLE who presents for follow-up.  She has previously been treated for asthma and was on Nunn for maintenance, which she has stopped in the last few months on her own.  She had an exacerbation requiring steroids and azithromycin in June 2020.  She does not feel as though Wixela ever improved her symptoms, and she could not tell a difference day-to-day that her symptoms were changing, so she did not think that it was working.  She is not sure  if she has had worsening of her chronic symptoms since stopping it as she feels that her shortness of breath is multifactorial from her multiple rheumatologic diseases.  She gets short of breath walking up a flight of stairs.  She has a history of chronic hoarseness and occasional chest heaviness that she is not able to relate to her breathing.  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  Follows with Dr. Lenna Gilford at [facility] rheumatology.  He is up-to-date on her seasonal flu and pneumonia vaccines.  No  fever, chills, sweats, or other infectious symptoms.  Her weight has increased and she is unsure why.   Past Medical History:  Diagnosis Date  . Anal fissure   . Anxiety   . Asthma   . Chronic interstitial cystitis   . COPD (chronic obstructive pulmonary disease) ([place])   . Crohn disease ([place])   . Fibromyalgia   . Migraines   . Pneumonia December 09, 2005  . SLE (systemic lupus erythematosus) ([facility])      Family History  Problem Relation Age of Onset  . Breast cancer Mother   . Lung cancer Father        Died in 2011/12/10  . Heart disease Father   . Breast cancer Maternal Grandmother   . Ovarian cancer Maternal Grandmother   . Breast cancer Maternal Aunt   . Prostate cancer Maternal Grandfather   . Colon cancer Maternal Grandfather   . Irritable bowel syndrome Sister   . Ovarian cancer Maternal Aunt   . Colon polyps Neg Hx   . Esophageal cancer Neg Hx   . Kidney disease Neg Hx   . Gallbladder disease Neg Hx   . Rectal cancer Neg Hx   . Stomach cancer Neg Hx      Past Surgical History:  Procedure Laterality Date  . BLADDER SURGERY    . CESAREAN SECTION    . CYSTOSCOPY    . KNEE ARTHROSCOPY    . SALPINGECTOMY Right    right  . TONSILLECTOMY    . WRIST SURGERY      Social History   Socioeconomic History  . Marital status: Married    Spouse name: Not on file  . Number of children: 2  . Years of education: Not on file  . Highest education level: Not on file  Occupational History  . Occupation: Print production planner  Tobacco Use  . Smoking status: Never Smoker  . Smokeless tobacco: Never Used  Substance and Sexual Activity  . Alcohol use: No    Alcohol/week: 0.0 standard drinks  . Drug use: No  . Sexual activity: Yes  Other Topics Concern  . Not on file  Social History Narrative  . Not on file   Social Determinants of Health   Financial Resource Strain:   . Difficulty of Paying Living Expenses:   Food Insecurity:   . Worried About Charity fundraiser in the Last Year:     . Arboriculturist in the Last Year:   Transportation Needs:   . Film/video editor (Medical):   Marland Kitchen Lack of Transportation (Non-Medical):   Physical Activity:   . Days of Exercise per Week:   . Minutes of Exercise per Session:   Stress:   . Feeling of Stress :   Social Connections:   . Frequency of Communication with Friends and Family:   . Frequency of Social Gatherings with Friends and Family:   . Attends Religious Services:   . Active Member of  Clubs or Organizations:   . Attends Archivist Meetings:   Marland Kitchen Marital Status:   Intimate Partner Violence:   . Fear of Current or Ex-Partner:   . Emotionally Abused:   Marland Kitchen Physically Abused:   . Sexually Abused:      Allergies  Allergen Reactions  . Ampicillin   . Cymbalta [Duloxetine Hcl] Other (See Comments)    Constipation, bloating, nausea  . Doxycycline Diarrhea  . Fentanyl Citrate     REACTION: vomiting, fever  . Penicillins      Immunization History  Administered Date(s) Administered  . Influenza Whole 08/04/2007, 08/29/2011, 07/29/2012  . Influenza,inj,Quad PF,6+ Mos 07/11/2015, 05/29/2018, 06/11/2019  . Moderna SARS-COVID-2 Vaccination 12/05/2019  . Pneumococcal Polysaccharide-23 05/29/2018  . Td 06/21/2010    Outpatient Medications Prior to Visit  Medication Sig Dispense Refill  . ALPRAZolam (XANAX) 0.5 MG tablet TAKE 1 TABLET(0.5 MG) BY MOUTH THREE TIMES DAILY AS NEEDED 90 tablet 0  . CRYSELLE-28 0.3-30 MG-MCG tablet Take 1 tablet by mouth daily.    . Fluticasone-Salmeterol (WIXELA INHUB) 250-50 MCG/DOSE AEPB INHALE 1 PUFF INTO THE LUNGS TWICE DAILY 60 each 5  . tiZANidine (ZANAFLEX) 4 MG tablet TAKE 1 TABLET BY MOUTH TWICE DAILY AS NEEDED 60 tablet 1  . topiramate (TOPAMAX) 100 MG tablet TAKE 1 TABLET BY MOUTH EVERY DAY 90 tablet 1  . triamcinolone cream (KENALOG) 0.1 % Apply 1 application topically 2 (two) times daily. 45 g 3  . albuterol (VENTOLIN HFA) 108 (90 Base) MCG/ACT inhaler Inhale 2 puffs  into the lungs every 6 (six) hours as needed for wheezing or shortness of breath. (Patient not taking: Reported on 12/24/2019) 6.7 g 6  . azithromycin (ZITHROMAX) 250 MG tablet 527m (two tablets) today, then 2590m(1 tablet) for the next 4 days 6 tablet 0  . FLUoxetine (PROZAC) 40 MG capsule Take 1 capsule (40 mg total) by mouth daily. 90 capsule 3   No facility-administered medications prior to visit.    Review of Systems  Constitutional: Negative for chills and fever.       Weight gain  Eyes: Negative.   Respiratory: Positive for shortness of breath. Negative for cough, sputum production and wheezing.   Cardiovascular: Positive for chest pain. Negative for leg swelling.  Gastrointestinal: Negative.   Genitourinary: Negative.   Musculoskeletal:       Chronic pain from lupus  Skin: Negative for rash.  Neurological: Negative for weakness.  Endo/Heme/Allergies: Bruises/bleeds easily.     Objective:   Vitals:   12/24/19 1032  BP: 110/68  Pulse: 87  Temp: 98.4 F (36.9 C)  TempSrc: Temporal  SpO2: 98%  Weight: 144 lb (65.3 kg)  Height: 5' 4"  (1.626 m)   98% on   RA BMI Readings from Last 3 Encounters:  12/24/19 24.72 kg/m  11/19/19 24.20 kg/m  10/25/19 25.16 kg/m   Wt Readings from Last 3 Encounters:  12/24/19 144 lb (65.3 kg)  11/19/19 141 lb (64 kg)  10/25/19 146 lb 9.6 oz (66.5 kg)    Physical Exam Vitals reviewed.  Constitutional:      General: She is not in acute distress.    Appearance: She is not ill-appearing.     Comments: Fatigued- appearing  HENT:     Head: Normocephalic and atraumatic.  Eyes:     General: No scleral icterus. Cardiovascular:     Rate and Rhythm: Normal rate and regular rhythm.     Heart sounds: No murmur.  Pulmonary:  Comments: Breathing comfortably on room air.  Occasionally stops to take a deep breath, but able to complete sentences fully.  Clear to auscultation bilaterally. Abdominal:     General: There is no distension.       Palpations: Abdomen is soft.  Musculoskeletal:        General: No swelling or deformity.     Cervical back: Neck supple.  Lymphadenopathy:     Cervical: No cervical adenopathy.  Skin:    General: Skin is warm and dry.     Findings: No rash.  Neurological:     Mental Status: She is alert.     Motor: No weakness.     Coordination: Coordination normal.      CBC    Component Value Date/Time   WBC 7.8 11/22/2019 1013   RBC 4.37 11/22/2019 1013   HGB 13.8 11/22/2019 1013   HCT 40.9 11/22/2019 1013   PLT 317.0 11/22/2019 1013   MCV 93.6 11/22/2019 1013   MCH 30.3 10/07/2018 1643   MCHC 33.7 11/22/2019 1013   RDW 13.8 11/22/2019 1013   LYMPHSABS 1.9 11/22/2019 1013   MONOABS 0.6 11/22/2019 1013   EOSABS 0.1 11/22/2019 1013   BASOSABS 0.0 11/22/2019 1013      Chest Imaging- films reviewed: CT chest high-resolution 01/14/2017-airway thickening.  No emphysema or fibrotic changes.  No air trapping.  Pulmonary Functions Testing Results: PFT Results Latest Ref Rng & Units 11/20/2017  FVC-Pre L 3.47  FVC-Predicted Pre % 98  FVC-Post L 3.72  FVC-Predicted Post % 105  Pre FEV1/FVC % % 63  Post FEV1/FCV % % 60  FEV1-Pre L 2.17  FEV1-Predicted Pre % 77  FEV1-Post L 2.23  DLCO UNC% % 101  DLCO COR %Predicted % 104  TLC L 5.89  TLC % Predicted % 116  RV % Predicted % 139   2019-mild obstruction with air trapping.  No significant response to bronchodilators, normal diffusion.  Flow volume loop concerning for fixed upper airway lesion, although expiratory flow curve does not meet ATS standards (Tech noted difficulty) raising suspicion that this is not of diagnostic significance.      Assessment & Plan:     ICD-10-CM   1. Chronic fatigue  R53.82 Split night study  2. Mild persistent asthma without complication  Q98.26 Split night study    Pulmonary function test  3. Systemic lupus erythematosus, unspecified SLE type, unspecified organ involvement status ([place])  M32.9 Split  night study  4. Dyspnea, unspecified type  R06.00 Split night study    Pulmonary function test    Chronic dyspnea- likely multifactorial. She has a history of asthma, but describes symptoms that seem less consistent with this being the primary driver of her symptoms. She has a history of alpha-1 antitrypsin deficiency carrier state with normal alpha-1 levels. With normal A1AT levels, being a carrier is unlikely to be of clinical significance. I worry that her chronic inflammation from multiple rheumatologic disorders, chronic blood loss from vaginal bleeding (not anemic luckily), and fibromyalgia are likely significant contributors.  -Continue Wixela twice daily.  Rinse mouth after every use. -Albuterol as needed.  Unfortunately she has bothersome side effects to using it. -Repeat PFTs to assess for uncontrolled asthma or a superimposed restrictive process. -Ongoing follow-up with rheumatology and her gynecologist.  Planning for surgery next month. -Up-to-date on seasonal flu vaccine and pneumonia vaccines.  Recommend completing her covid vaccine. -Polysomnogram ordered to assess her persistent fatigue and difficulty sleeping.   ARISCAT score = 41,  intermediate risk (13.3% chance of post-op pulmonary complications).  Postop recommendations: -Early mobility -Good pulmonary hygiene-incentive spirometer, effective coughing, being out of bed -DVT prophylaxis when appropriate -She should use her inhaler the morning of surgery and continue while admitted to the hospital and postoperatively. -If she is unable to use inhalers while admitted, recommend Pulmicort nebulizers twice daily and duo nebs every 4-6 hours as needed.   RTC in 2 months.  >50 minutes spent on this encounter including records review, time spent face to face with the patient, and charting.   Current Outpatient Medications:  .  ALPRAZolam (XANAX) 0.5 MG tablet, TAKE 1 TABLET(0.5 MG) BY MOUTH THREE TIMES DAILY AS NEEDED, Disp:  90 tablet, Rfl: 0 .  CRYSELLE-28 0.3-30 MG-MCG tablet, Take 1 tablet by mouth daily., Disp: , Rfl:  .  Fluticasone-Salmeterol (WIXELA INHUB) 250-50 MCG/DOSE AEPB, INHALE 1 PUFF INTO THE LUNGS TWICE DAILY, Disp: 60 each, Rfl: 5 .  tiZANidine (ZANAFLEX) 4 MG tablet, TAKE 1 TABLET BY MOUTH TWICE DAILY AS NEEDED, Disp: 60 tablet, Rfl: 1 .  topiramate (TOPAMAX) 100 MG tablet, TAKE 1 TABLET BY MOUTH EVERY DAY, Disp: 90 tablet, Rfl: 1 .  triamcinolone cream (KENALOG) 0.1 %, Apply 1 application topically 2 (two) times daily., Disp: 45 g, Rfl: 3 .  albuterol (VENTOLIN HFA) 108 (90 Base) MCG/ACT inhaler, Inhale 2 puffs into the lungs every 6 (six) hours as needed for wheezing or shortness of breath. (Patient not taking: Reported on 12/24/2019), Disp: 6.7 g, Rfl: 6   Julian Hy, DO [facility] 12/24/2019 11:43 AM

## 2019-12-29 ENCOUNTER — Telehealth (INDEPENDENT_AMBULATORY_CARE_PROVIDER_SITE_OTHER): Payer: Federal, State, Local not specified - PPO | Admitting: Medical

## 2019-12-29 DIAGNOSIS — J329 Chronic sinusitis, unspecified: Secondary | ICD-10-CM

## 2019-12-29 DIAGNOSIS — J301 Allergic rhinitis due to pollen: Secondary | ICD-10-CM | POA: Diagnosis not present

## 2019-12-29 MED ORDER — PREDNISONE 10 MG (21) PO TBPK: ORAL_TABLET | ORAL | 0 refills | Status: DC

## 2019-12-29 MED ORDER — AZITHROMYCIN 250 MG PO TABS: ORAL_TABLET | ORAL | 0 refills | Status: DC

## 2019-12-29 NOTE — Progress Notes (Signed)
Subjective:    Patient ID: Sheila [name], female    DOB: 05-04-1966, 54 y.o.   MRN: [id number]  HPI  Pt in with some recent sinus pressure. Hx of very frequent sinus infection. Pt has hx of allergies as well. Recent pollen exposure. Eyes have been itching and watering. Sinus are now very tender to palpation.   Pain moderate to severe. Pt is frustrated with duration of signs/symptoms for about 10 days.  Pt has been taking claritin for one month. In the past pt states has used flonase. But she got nose bleed. Pt saw Ent who recommended stopping flonase.    Review of Systems  Constitutional: Negative for chills, fatigue and fever.  HENT: Positive for congestion, sinus pressure and sinus pain. Negative for sore throat.   Respiratory: Negative for cough, chest tightness, shortness of breath and wheezing.   Cardiovascular: Negative for chest pain and palpitations.  Gastrointestinal: Negative for abdominal pain.  Musculoskeletal: Negative for back pain.  Neurological: Negative for dizziness, light-headedness and numbness.  Hematological: Negative for adenopathy. Does not bruise/bleed easily.  Psychiatric/Behavioral: Negative for behavioral problems, decreased concentration and hallucinations.    Past Medical History:  Diagnosis Date  . Anal fissure   . Anxiety   . Asthma   . Chronic interstitial cystitis   . COPD (chronic obstructive pulmonary disease) ([place])   . Crohn disease ([place])   . Fibromyalgia   . Migraines   . Pneumonia 2007  . SLE (systemic lupus erythematosus) ([facility])      Social History   Socioeconomic History  . Marital status: Married    Spouse name: Not on file  . Number of children: 2  . Years of education: Not on file  . Highest education level: Not on file  Occupational History  . Occupation: Print production planner  Tobacco Use  . Smoking status: Never Smoker  . Smokeless tobacco: Never Used  Substance and Sexual Activity  . Alcohol use: No    Alcohol/week:  0.0 standard drinks  . Drug use: No  . Sexual activity: Yes  Other Topics Concern  . Not on file  Social History Narrative  . Not on file   Social Determinants of Health   Financial Resource Strain:   . Difficulty of Paying Living Expenses:   Food Insecurity:   . Worried About Charity fundraiser in the Last Year:   . Arboriculturist in the Last Year:   Transportation Needs:   . Film/video editor (Medical):   Marland Kitchen Lack of Transportation (Non-Medical):   Physical Activity:   . Days of Exercise per Week:   . Minutes of Exercise per Session:   Stress:   . Feeling of Stress :   Social Connections:   . Frequency of Communication with Friends and Family:   . Frequency of Social Gatherings with Friends and Family:   . Attends Religious Services:   . Active Member of Clubs or Organizations:   . Attends Archivist Meetings:   Marland Kitchen Marital Status:   Intimate Partner Violence:   . Fear of Current or Ex-Partner:   . Emotionally Abused:   Marland Kitchen Physically Abused:   . Sexually Abused:     Past Surgical History:  Procedure Laterality Date  . BLADDER SURGERY    . CESAREAN SECTION    . CYSTOSCOPY    . KNEE ARTHROSCOPY    . SALPINGECTOMY Right    right  . TONSILLECTOMY    . WRIST SURGERY  Family History  Problem Relation Age of Onset  . Breast cancer Mother   . Lung cancer Father        Died in 2011-11-29  . Heart disease Father   . Breast cancer Maternal Grandmother   . Ovarian cancer Maternal Grandmother   . Breast cancer Maternal Aunt   . Prostate cancer Maternal Grandfather   . Colon cancer Maternal Grandfather   . Irritable bowel syndrome Sister   . Ovarian cancer Maternal Aunt   . Colon polyps Neg Hx   . Esophageal cancer Neg Hx   . Kidney disease Neg Hx   . Gallbladder disease Neg Hx   . Rectal cancer Neg Hx   . Stomach cancer Neg Hx     Allergies  Allergen Reactions  . Ampicillin   . Cymbalta [Duloxetine Hcl] Other (See Comments)    Constipation,  bloating, nausea  . Doxycycline Diarrhea  . Fentanyl Citrate     REACTION: vomiting, fever  . Penicillins     Current Outpatient Medications on File Prior to Visit  Medication Sig Dispense Refill  . albuterol (VENTOLIN HFA) 108 (90 Base) MCG/ACT inhaler Inhale 2 puffs into the lungs every 6 (six) hours as needed for wheezing or shortness of breath. (Patient not taking: Reported on 12/24/2019) 6.7 g 6  . ALPRAZolam (XANAX) 0.5 MG tablet TAKE 1 TABLET(0.5 MG) BY MOUTH THREE TIMES DAILY AS NEEDED 90 tablet 0  . CRYSELLE-28 0.3-30 MG-MCG tablet Take 1 tablet by mouth daily.    . Fluticasone-Salmeterol (WIXELA INHUB) 250-50 MCG/DOSE AEPB INHALE 1 PUFF INTO THE LUNGS TWICE DAILY 60 each 5  . tiZANidine (ZANAFLEX) 4 MG tablet TAKE 1 TABLET BY MOUTH TWICE DAILY AS NEEDED 60 tablet 1  . topiramate (TOPAMAX) 100 MG tablet TAKE 1 TABLET BY MOUTH EVERY DAY 90 tablet 1  . triamcinolone cream (KENALOG) 0.1 % Apply 1 application topically 2 (two) times daily. 45 g 3   No current facility-administered medications on file prior to visit.    There were no vitals taken for this visit.      Objective:   Physical Exam  General-no acute distress, pleasant, oriented. Lungs- on inspection lungs appear unlabored. Neck- no tracheal deviation or jvd on inspection. Neuro- gross motor function appears intact.      Assessment & Plan:  Recent allergic rhinitis signs and symptoms with sinus infection after persisting nasal congestion. Can continue claritin, rx taper prednisone and start azithromycin. Not unfortunately told by ENT not to use nasal steroids.  Note pt in dilemma regarding desire to relieve current symptoms and has upcoming covid vaccine. Gave instruction on how to use prednisone but not stop day before vaccine, day of vaccine and following day. Then can resume. Pt expressed understanding.  Follow up 7-10 days or as needed  Time spent with patient today was  25  minutes which consisted of  discussing diagnosis, treatment plan, answering pt questions and documentation.  Mackie Pai, PA-C

## 2019-12-29 NOTE — Patient Instructions (Addendum)
Recent allergic rhinitis signs and symptoms with sinus infection after persisting nasal congestion. Can continue claritin, rx taper prednisone and start azithromycin. Not unfortunately told by ENT not to use nasal steroids.  Note pt in dilemma regarding desire to relieve current symptoms and has upcoming covid vaccine. Gave instruction on how to use prednisone but not stop day before vaccine, day of vaccine and following day. Then can resume. Pt expressed understanding.  Follow up 7-10 days or as needed

## 2020-01-01 ENCOUNTER — Other Ambulatory Visit (HOSPITAL_COMMUNITY)
Admission: RE | Admit: 2020-01-01 | Discharge: 2020-01-01 | Disposition: A | Discharge status: Home / Self Care | Payer: Federal, State, Local not specified - PPO | Source: Ambulatory Visit | Attending: Critical Care Medicine | Admitting: Critical Care Medicine

## 2020-01-01 DIAGNOSIS — Z20822 Contact with and (suspected) exposure to covid-19: Secondary | ICD-10-CM | POA: Insufficient documentation

## 2020-01-01 DIAGNOSIS — Z01812 Encounter for preprocedural laboratory examination: Secondary | ICD-10-CM | POA: Diagnosis not present

## 2020-01-01 LAB — SARS CORONAVIRUS 2 (TAT 6-24 HRS): SARS Coronavirus 2: NEGATIVE

## 2020-01-05 ENCOUNTER — Other Ambulatory Visit: Payer: Self-pay

## 2020-01-05 ENCOUNTER — Ambulatory Visit (HOSPITAL_BASED_OUTPATIENT_CLINIC_OR_DEPARTMENT_OTHER): Payer: Federal, State, Local not specified - PPO | Attending: Critical Care Medicine | Admitting: Pulmonary Disease

## 2020-01-05 ENCOUNTER — Ambulatory Visit (INDEPENDENT_AMBULATORY_CARE_PROVIDER_SITE_OTHER): Payer: Federal, State, Local not specified - PPO | Admitting: Critical Care Medicine

## 2020-01-05 VITALS — Temp 98.1°F | Ht 64.0 in | Wt 145.0 lb

## 2020-01-05 DIAGNOSIS — M329 Systemic lupus erythematosus, unspecified: Secondary | ICD-10-CM | POA: Insufficient documentation

## 2020-01-05 DIAGNOSIS — R0600 Dyspnea, unspecified: Secondary | ICD-10-CM | POA: Insufficient documentation

## 2020-01-05 DIAGNOSIS — R5382 Chronic fatigue, unspecified: Secondary | ICD-10-CM

## 2020-01-05 DIAGNOSIS — R0683 Snoring: Secondary | ICD-10-CM

## 2020-01-05 DIAGNOSIS — J4530 Mild persistent asthma, uncomplicated: Secondary | ICD-10-CM

## 2020-01-05 DIAGNOSIS — R5383 Other fatigue: Secondary | ICD-10-CM | POA: Insufficient documentation

## 2020-01-05 LAB — PULMONARY FUNCTION TEST
DL/VA % pred: 105 %
DL/VA: 4.54 ml/min/mmHg/L
DLCO cor % pred: 114 %
DLCO cor: 24.05 ml/min/mmHg
DLCO unc % pred: 116 %
DLCO unc: 24.34 ml/min/mmHg
FEF 25-75 Post: 3.77 L/sec
FEF 25-75 Pre: 3.28 L/sec
FEF2575-%Change-Post: 14 %
FEF2575-%Pred-Post: 138 %
FEF2575-%Pred-Pre: 120 %
FEV1-%Change-Post: 2 %
FEV1-%Pred-Post: 111 %
FEV1-%Pred-Pre: 109 %
FEV1-Post: 3.11 L
FEV1-Pre: 3.04 L
FEV1FVC-%Change-Post: 5 %
FEV1FVC-%Pred-Pre: 101 %
FEV6-%Change-Post: -2 %
FEV6-%Pred-Post: 106 %
FEV6-%Pred-Pre: 108 %
FEV6-Post: 3.63 L
FEV6-Pre: 3.73 L
FEV6FVC-%Change-Post: 0 %
FEV6FVC-%Pred-Post: 102 %
FEV6FVC-%Pred-Pre: 102 %
FVC-%Change-Post: -2 %
FVC-%Pred-Post: 103 %
FVC-%Pred-Pre: 106 %
FVC-Post: 3.63 L
FVC-Pre: 3.73 L
Post FEV1/FVC ratio: 86 %
Post FEV6/FVC ratio: 100 %
Pre FEV1/FVC ratio: 81 %
Pre FEV6/FVC Ratio: 100 %
RV % pred: 93 %
RV: 1.68 L
TLC % pred: 106 %
TLC: 5.41 L

## 2020-01-05 NOTE — Progress Notes (Signed)
PFT done today. 

## 2020-01-06 ENCOUNTER — Other Ambulatory Visit (HOSPITAL_BASED_OUTPATIENT_CLINIC_OR_DEPARTMENT_OTHER): Payer: Self-pay

## 2020-01-06 DIAGNOSIS — R5382 Chronic fatigue, unspecified: Secondary | ICD-10-CM

## 2020-01-06 DIAGNOSIS — M329 Systemic lupus erythematosus, unspecified: Secondary | ICD-10-CM

## 2020-01-06 DIAGNOSIS — J4530 Mild persistent asthma, uncomplicated: Secondary | ICD-10-CM

## 2020-01-06 DIAGNOSIS — R0600 Dyspnea, unspecified: Secondary | ICD-10-CM

## 2020-01-11 NOTE — H&P (Signed)
Patient name Sheila [name], Sheila [name] DICTATION# [id number] CSN# [id number]  Arvella Nigh, MD 01/11/2020 5:37 AM

## 2020-01-12 NOTE — H&P (Signed)
NAME: [name], Sheila A. MEDICAL RECORD [id number] ACCOUNT [id number] DATE OF BIRTH:[birth year] FACILITY: [facility] LOCATION:  PHYSICIAN:Kenzie Thoreson Sherran Needs, MD  HISTORY AND PHYSICAL  DATE OF ADMISSION:  01/24/2020  HISTORY OF PRESENT ILLNESS:  The patient is a 54 year old gravida 1, para 73 female who presents for laparoscopic assisted vaginal hysterectomy with bilateral salpingo-oophorectomy and cystoscopy.  She has been managed for abnormal uterine bleeding.  Has  been on birth control pills.  We did do a hysteroscopy with resection of a benign endometrial polyp in January of this year.  Pathology was benign.  Despite this, she has continued to have abnormal bleeding.  Her previous [place] was normal.  Her hemoglobin  has been stable during this time.  There is a family history of multiple individuals with breast cancer.  The patient did undergo genetic testing, which came back negative.  She had a previously mildly elevated CA-125, but a normal pelvic ultrasound.   She saw the GYN oncologist as well as a hematology oncologist.  They both recommended removal of tubes and ovaries for which she is admitted at the present time.  ALLERGIES:  DOXYCYCLINE, FENTANYL AND PENICILLINS.  PRESENT MEDICATIONS:  Xanax.  Fluoxetine 40 mg capsules.  Tizanidine 4 mg tablets.  Topiramate 100 mg tablets.  PAST MEDICAL HISTORY:  She does have a history of Crohn's disease, chronic interstitial cystitis, as well as lupus.  She also has a history of alpha-1 antitrypsin deficiency, followed by pulmonologist.    PAST SURGICAL HISTORY:  She has no past surgical history.    FAMILY HISTORY:  Significant in that there is a strong family history of breast cancer in mother and maternal grandmother.  Maternal grandfather had colon cancer.  Maternal grandfather also had prostate cancer.  SOCIAL HISTORY:  Reveals no tobacco or alcohol use.  REVIEW OF SYSTEMS:  Noncontributory.  PHYSICAL EXAMINATION: VITAL SIGNS:  The patient is  afebrile with stable vital signs. HEENT:  The patient is normocephalic.  Pupils equal, round, reactive to light and accommodation.  Extraocular movements are intact.  Sclerae and conjunctivae to clear.  Oropharynx was clear. NECK:  Without thyromegaly. LUNGS:  Clear. CARDIOVASCULAR:  Shows regular rhythm and rate.  No murmurs or gallops. ABDOMEN:  Benign.  No masses, organomegaly or tenderness. PELVIC:  Normal external genitalia.  Vaginal mucosa is clear.  Cervix unremarkable.  Uterus normal size, shape and contour.  Adnexa free of masses or tenderness. EXTREMITIES:  Trace edema.   NEUROLOGIC:  Grossly within normal limits.  IMPRESSION: 1.  Abnormal uterine bleeding, unresponsive to conservative therapy. 2.  Multiple individuals with breast cancer with elevated lifetime risk. 3.  Mildly elevated CA-125. 4.  Crohn's disease. 5.  Interstitial cystitis. 6.  Alpha 1 antitrypsin deficiency.  PLAN:  We discussed options for management of abnormal bleeding.  She has been unresponsive to birth control pills.  We discussed other options including IUDs or ablation.  She wants to proceed with definitive therapy in the form of hysterectomy with  removal of both tubes and ovaries in view of family history.  The nature of the procedure has been discussed.  The risks have been explained.  The risks include the risk of infection.  Risk of hemorrhage that could require transfusion with the risk of  AIDS or hepatitis.  Risk of injury to adjacent organs including bladder, bowel, ureters that could require further exploratory surgery.  Risk of deep venous thrombosis and pulmonary embolus.  The patient expressed understanding and indication, risks, and  alternatives.  CN/NUANCE  D:01/11/2020 T:01/11/2020 JOB:[id number]

## 2020-01-17 NOTE — Patient Instructions (Signed)
Get your covid test at [facility] on 01/20/20 at 11:00   Sheila [name]      Your procedure is scheduled on  01/24/20   Report to [facility]  at 5:30 A.M.   Call this number if you have problems the morning of surgery:[phone]   OUR ADDRESS IS [place], WE ARE LOCATED [facility] THE [facility] WITH [facility].   Remember:  Do not eat food or drink liquids after midnight.  Take these medicines the morning of surgery with A SIP OF WATER: Xanax if needed,               use your inhalers and bring them to the hospital with you.   Do not wear jewelry, make-up or nail polish.  Do not wear lotions, powders, or perfumes, or deoderant.  Do not shave 48 hours prior to surgery.  Men may shave face and neck.  Do not bring valuables to the hospital.  [facility] is not responsible for any belongings or valuables.  Contacts, dentures or bridgework may not be worn into surgery.  Leave your suitcase in the car.  After surgery it may be brought to your room.  For patients admitted to the hospital, discharge time will be determined by your treatment team.  Patients discharged the day of surgery will not be allowed to drive home.   Special instructions:  Bring your prescription meds in their original bottles  Please read over the following fact sheets that you were given:       [facility] - Preparing for Surgery Before surgery, you can play an important role.   Because skin is not sterile, your skin needs to be as free of germs as possible.   You can reduce the number of germs on your skin by washing with CHG (chlorahexidine gluconate) soap before surgery.   CHG is an antiseptic cleaner which kills germs and bonds with the skin to continue killing germs even after washing. Please DO NOT use if you have an allergy to CHG or antibacterial soaps.   If your skin becomes reddened/irritated stop using the CHG and inform your nurse when you arrive at [facility]. Do not shave (including legs and underarms) for at least 48 hours prior to the first CHG shower.   Please follow these instructions carefully:  1.  Shower with CHG Soap the night before surgery and the  morning of Surgery.  2.  If you choose to wash your hair, wash your hair first as usual with your  normal  shampoo.  3.  After you shampoo, rinse your hair and body thoroughly to remove the  shampoo.                                        4.  Use CHG as you would any other liquid soap.  You can apply chg directly  to the skin and wash                       Gently with a scrungie or clean washcloth.  5.  Apply the CHG Soap to your body ONLY FROM THE NECK DOWN.   Do not use on face/ open  Wound or open sores. Avoid contact with eyes, ears mouth and genitals (private parts).                       Wash face,  Genitals (private parts) with your normal soap.             6.  Wash thoroughly, paying special attention to the area where your surgery  will be performed.  7.  Thoroughly rinse your body with warm water from the neck down.  8.  DO NOT shower/wash with your normal soap after using and rinsing off  the CHG Soap.             9.  Pat yourself dry with a clean towel.            10.  Wear clean pajamas.            11.  Place clean sheets on your bed the night of your first shower and do not  sleep with pets. Day of Surgery : Do not apply any lotions/deodorants the morning of surgery.  Please wear clean clothes to the hospital/surgery center.  FAILURE TO FOLLOW THESE INSTRUCTIONS MAY RESULT IN THE CANCELLATION OF YOUR SURGERY PATIENT SIGNATURE_________________________________  NURSE SIGNATURE__________________________________  ________________________________________________________________________

## 2020-01-18 ENCOUNTER — Encounter (HOSPITAL_COMMUNITY): Payer: Self-pay

## 2020-01-18 ENCOUNTER — Encounter (HOSPITAL_COMMUNITY)
Admission: RE | Admit: 2020-01-18 | Discharge: 2020-01-18 | Disposition: A | Discharge status: Home / Self Care | Payer: Federal, State, Local not specified - PPO | Source: Ambulatory Visit | Attending: Obstetrics and Gynecology | Admitting: Obstetrics and Gynecology

## 2020-01-18 ENCOUNTER — Other Ambulatory Visit: Payer: Self-pay

## 2020-01-18 DIAGNOSIS — Z01818 Encounter for other preprocedural examination: Secondary | ICD-10-CM | POA: Insufficient documentation

## 2020-01-18 NOTE — Procedures (Signed)
    Patient Name: Sheila, [name] Date: 01/05/2020 Gender: Female D.O.B: 1966-03-17 Age (years): 53 Referring Provider: Noemi Chapel DO Height (inches): 44 Interpreting Physician: Chesley Mires MD, ABSM Weight (lbs): 145 RPSGT: Laren Everts BMI: 25 MRN: [id number] Neck Size: 13.00  CLINICAL INFORMATION Sleep Study Type: NPSG  Indication for sleep study: Excessive Daytime Sleepiness, Fatigue, Morning Headaches  Epworth Sleepiness Score: 7  SLEEP STUDY TECHNIQUE As per the AASM Manual for the Scoring of Sleep and Associated Events v2.3 (April 2016) with a hypopnea requiring 4% desaturations.  The channels recorded and monitored were frontal, central and occipital EEG, electrooculogram (EOG), submentalis EMG (chin), nasal and oral airflow, thoracic and abdominal wall motion, anterior tibialis EMG, snore microphone, electrocardiogram, and pulse oximetry.  MEDICATIONS Medications self-administered by patient taken the night of the study : ADVIL, TOPIRAMATE, ALPRAZOLAM  SLEEP ARCHITECTURE The study was initiated at 10:56:24 PM and ended at 4:58:23 AM.  Sleep onset time was 30.7 minutes and the sleep efficiency was 76.0%%. The total sleep time was 275 minutes.  Stage REM latency was 87.5 minutes.  The patient spent 5.1%% of the night in stage N1 sleep, 69.5%% in stage N2 sleep, 0.0%% in stage N3 and 25.5% in REM.  Alpha intrusion was absent.  Supine sleep was 39.64%.  RESPIRATORY PARAMETERS The overall apnea/hypopnea index (AHI) was 1.1 per hour. There were 1 total apneas, including 0 obstructive, 1 central and 0 mixed apneas. There were 4 hypopneas and 7 RERAs.  The AHI during Stage REM sleep was 4.3 per hour.  AHI while supine was 2.2 per hour.  The mean oxygen saturation was 95.8%. The minimum SpO2 during sleep was 91.0%.  moderate snoring was noted during this study.  CARDIAC DATA The 2 lead EKG demonstrated sinus rhythm. The mean heart rate was 78.1 beats  per minute. Other EKG findings include: PVCs.  LEG MOVEMENT DATA The total PLMS were 0 with a resulting PLMS index of 0.0. Associated arousal with leg movement index was 0.0 .  IMPRESSIONS - No significant obstructive sleep apnea occurred during this study (AHI = 1.1/h). - No significant central sleep apnea occurred during this study (CAI = 0.2/h). - The patient had no oxygen desaturation during the study (Min O2 = 91.0%) - The patient snored with moderate snoring volume. - EKG findings include PVCs. - Clinically significant periodic limb movements did not occur during sleep. No significant associated arousals.  DIAGNOSIS - Snoring.  RECOMMENDATIONS - Avoid alcohol, sedatives and other CNS depressants that may worsen sleep apnea and disrupt normal sleep architecture. - Sleep hygiene should be reviewed to assess factors that may improve sleep quality. - Weight management and regular exercise should be initiated or continued if appropriate.  [Electronically signed] 01/18/2020 02:23 PM  Chesley Mires MD, [place], American Board of Sleep Medicine   NPI: [id number]

## 2020-01-18 NOTE — Progress Notes (Signed)
PCP - Y. [facility] Cardiologist - none  Chest x-ray - no EKG - no Stress Test - no ECHO - no Cardiac Cath - no  Sleep Study - yes. Study done 01/05/20 CPAP - no  Fasting Blood Sugar - NA Checks Blood Sugar _____ times a day  Blood Thinner Instructions:NA Aspirin Instructions: Last Dose:  Anesthesia review:   Patient denies shortness of breath, fever, cough and chest pain at PAT appointment yes  Patient verbalized understanding of instructions that were given to them at the PAT appointment. Patient was also instructed that they will need to review over the PAT instructions again at home before surgery. yes

## 2020-01-20 ENCOUNTER — Other Ambulatory Visit (HOSPITAL_COMMUNITY)
Admission: RE | Admit: 2020-01-20 | Discharge: 2020-01-20 | Disposition: A | Discharge status: Home / Self Care | Payer: Federal, State, Local not specified - PPO | Source: Ambulatory Visit | Attending: Obstetrics and Gynecology | Admitting: Obstetrics and Gynecology

## 2020-01-20 ENCOUNTER — Other Ambulatory Visit: Payer: Self-pay

## 2020-01-20 ENCOUNTER — Encounter (HOSPITAL_COMMUNITY)
Admission: RE | Admit: 2020-01-20 | Discharge: 2020-01-20 | Disposition: A | Discharge status: Home / Self Care | Payer: Federal, State, Local not specified - PPO | Source: Ambulatory Visit | Attending: Obstetrics and Gynecology | Admitting: Obstetrics and Gynecology

## 2020-01-20 DIAGNOSIS — Z20822 Contact with and (suspected) exposure to covid-19: Secondary | ICD-10-CM | POA: Insufficient documentation

## 2020-01-20 DIAGNOSIS — Z01812 Encounter for preprocedural laboratory examination: Secondary | ICD-10-CM | POA: Insufficient documentation

## 2020-01-20 DIAGNOSIS — N939 Abnormal uterine and vaginal bleeding, unspecified: Secondary | ICD-10-CM | POA: Diagnosis not present

## 2020-01-20 LAB — COMPREHENSIVE METABOLIC PANEL
ALT: 15 U/L (ref 0–44)
AST: 20 U/L (ref 15–41)
Albumin: 4.1 g/dL (ref 3.5–5.0)
Alkaline Phosphatase: 66 U/L (ref 38–126)
Anion gap: 10 (ref 5–15)
BUN: 13 mg/dL (ref 6–20)
CO2: 22 mmol/L (ref 22–32)
Calcium: 9 mg/dL (ref 8.9–10.3)
Chloride: 108 mmol/L (ref 98–111)
Creatinine, Ser: 0.9 mg/dL (ref 0.44–1.00)
GFR calc Af Amer: >60 mL/min (ref >60)
GFR calc non Af Amer: >60 mL/min (ref >60)
Glucose, Bld: 99 mg/dL (ref 70–99)
Potassium: 3.8 mmol/L (ref 3.5–5.1)
Sodium: 140 mmol/L (ref 135–145)
Total Bilirubin: 0.8 mg/dL (ref 0.3–1.2)
Total Protein: 8.1 g/dL (ref 6.5–8.1)

## 2020-01-20 LAB — ABO/RH: ABO/RH(D): O POS

## 2020-01-20 LAB — CBC
HCT: 45.1 % (ref 36.0–46.0)
Hemoglobin: 14.3 g/dL (ref 12.0–15.0)
MCH: 30.6 pg (ref 26.0–34.0)
MCHC: 31.7 g/dL (ref 30.0–36.0)
MCV: 96.6 fL (ref 80.0–100.0)
Platelets: 314 10*3/uL (ref 150–400)
RBC: 4.67 MIL/uL (ref 3.87–5.11)
RDW: 13.5 % (ref 11.5–15.5)
WBC: 10.3 10*3/uL (ref 4.0–10.5)
nRBC: 0 % (ref 0.0–0.2)

## 2020-01-20 LAB — SARS CORONAVIRUS 2 (TAT 6-24 HRS): SARS Coronavirus 2: NEGATIVE

## 2020-01-20 LAB — HCG, SERUM, QUALITATIVE: Preg, Serum: NEGATIVE

## 2020-01-23 NOTE — Anesthesia Preprocedure Evaluation (Addendum)
Anesthesia Evaluation  Patient identified by MRN, date of birth, ID band Patient awake    Reviewed: Allergy & Precautions, NPO status , Patient's Chart, lab work & pertinent test results  Airway Mallampati: I  TM Distance: >3 FB Neck ROM: Full    Dental no notable dental hx. (+) Teeth Intact, Dental Advisory Given   Pulmonary asthma ,  Hx of Alpha antitrypsin deficiency   Pulmonary exam normal breath sounds clear to auscultation       Cardiovascular negative cardio ROS Normal cardiovascular exam Rhythm:Regular Rate:Normal     Neuro/Psych  Headaches, PSYCHIATRIC DISORDERS Anxiety Depression    GI/Hepatic negative GI ROS, Neg liver ROS,   Endo/Other  negative endocrine ROS  Renal/GU negative Renal ROS     Musculoskeletal  (+) Fibromyalgia -Hx of Lupus   Abdominal   Peds  Hematology negative hematology ROS (+) Hgb 14.3 Plt 314   Anesthesia Other Findings All: Amoicillin, Duloxetine, Doxycycline, PCN, Fentanyl  Reproductive/Obstetrics negative OB ROS                            Anesthesia Physical Anesthesia Plan  ASA: II  Anesthesia Plan: General   Post-op Pain Management:    Induction: Intravenous  PONV Risk Score and Plan: 4 or greater and Treatment may vary due to age or medical condition, Dexamethasone, Scopolamine patch - Pre-op, Droperidol and Midazolam  Airway Management Planned: Oral ETT  Additional Equipment: None  Intra-op Plan:   Post-operative Plan: Extubation in OR  Informed Consent: I have reviewed the patients History and Physical, chart, labs and discussed the procedure including the risks, benefits and alternatives for the proposed anesthesia with the patient or authorized representative who has indicated his/her understanding and acceptance.     Dental advisory given  Plan Discussed with:   Anesthesia Plan Comments: (54 y/o w Hx of Lupus, fibromyalgia,  alpha 1 antitrypsin and DUB for lap assisted Vaginal Hysterectomy with BSO    GA + Dexmedetomidine 0.4/kg )      Anesthesia Quick Evaluation

## 2020-01-24 ENCOUNTER — Observation Stay (HOSPITAL_BASED_OUTPATIENT_CLINIC_OR_DEPARTMENT_OTHER)
Admission: RE | Admit: 2020-01-24 | Discharge: 2020-01-25 | Disposition: A | Discharge status: Home / Self Care | Payer: Federal, State, Local not specified - PPO | Source: Ambulatory Visit | Attending: Obstetrics and Gynecology | Admitting: Obstetrics and Gynecology

## 2020-01-24 ENCOUNTER — Observation Stay (HOSPITAL_BASED_OUTPATIENT_CLINIC_OR_DEPARTMENT_OTHER): Payer: Federal, State, Local not specified - PPO | Admitting: Anesthesiology

## 2020-01-24 ENCOUNTER — Encounter (HOSPITAL_BASED_OUTPATIENT_CLINIC_OR_DEPARTMENT_OTHER): Payer: Self-pay | Admitting: Obstetrics and Gynecology

## 2020-01-24 ENCOUNTER — Other Ambulatory Visit: Payer: Self-pay

## 2020-01-24 ENCOUNTER — Encounter (HOSPITAL_BASED_OUTPATIENT_CLINIC_OR_DEPARTMENT_OTHER)
Admission: RE | Disposition: A | Discharge status: Home / Self Care | Payer: Self-pay | Source: Ambulatory Visit | Attending: Obstetrics and Gynecology

## 2020-01-24 DIAGNOSIS — F419 Anxiety disorder, unspecified: Secondary | ICD-10-CM | POA: Diagnosis not present

## 2020-01-24 DIAGNOSIS — N3010 Interstitial cystitis (chronic) without hematuria: Secondary | ICD-10-CM | POA: Insufficient documentation

## 2020-01-24 DIAGNOSIS — D251 Intramural leiomyoma of uterus: Secondary | ICD-10-CM | POA: Diagnosis not present

## 2020-01-24 DIAGNOSIS — M797 Fibromyalgia: Secondary | ICD-10-CM | POA: Diagnosis not present

## 2020-01-24 DIAGNOSIS — M329 Systemic lupus erythematosus, unspecified: Secondary | ICD-10-CM | POA: Diagnosis not present

## 2020-01-24 DIAGNOSIS — D259 Leiomyoma of uterus, unspecified: Secondary | ICD-10-CM | POA: Diagnosis not present

## 2020-01-24 DIAGNOSIS — Z90710 Acquired absence of both cervix and uterus: Secondary | ICD-10-CM | POA: Diagnosis not present

## 2020-01-24 DIAGNOSIS — Z803 Family history of malignant neoplasm of breast: Secondary | ICD-10-CM | POA: Insufficient documentation

## 2020-01-24 DIAGNOSIS — R971 Elevated cancer antigen 125 [CA 125]: Secondary | ICD-10-CM | POA: Diagnosis not present

## 2020-01-24 DIAGNOSIS — N939 Abnormal uterine and vaginal bleeding, unspecified: Secondary | ICD-10-CM | POA: Diagnosis not present

## 2020-01-24 DIAGNOSIS — G43009 Migraine without aura, not intractable, without status migrainosus: Secondary | ICD-10-CM | POA: Diagnosis not present

## 2020-01-24 DIAGNOSIS — K5090 Crohn's disease, unspecified, without complications: Secondary | ICD-10-CM | POA: Insufficient documentation

## 2020-01-24 DIAGNOSIS — J45909 Unspecified asthma, uncomplicated: Secondary | ICD-10-CM | POA: Diagnosis not present

## 2020-01-24 DIAGNOSIS — Z79899 Other long term (current) drug therapy: Secondary | ICD-10-CM | POA: Diagnosis not present

## 2020-01-24 DIAGNOSIS — N800 Endometriosis of uterus: Secondary | ICD-10-CM | POA: Insufficient documentation

## 2020-01-24 DIAGNOSIS — J4530 Mild persistent asthma, uncomplicated: Secondary | ICD-10-CM | POA: Diagnosis not present

## 2020-01-24 DIAGNOSIS — N888 Other specified noninflammatory disorders of cervix uteri: Secondary | ICD-10-CM | POA: Diagnosis not present

## 2020-01-24 DIAGNOSIS — N72 Inflammatory disease of cervix uteri: Secondary | ICD-10-CM | POA: Diagnosis not present

## 2020-01-24 DIAGNOSIS — F329 Major depressive disorder, single episode, unspecified: Secondary | ICD-10-CM | POA: Insufficient documentation

## 2020-01-24 DIAGNOSIS — E8801 Alpha-1-antitrypsin deficiency: Secondary | ICD-10-CM | POA: Insufficient documentation

## 2020-01-24 HISTORY — PX: LAPAROSCOPIC VAGINAL HYSTERECTOMY WITH SALPINGO OOPHORECTOMY: SHX6681

## 2020-01-24 LAB — TYPE AND SCREEN
ABO/RH(D): O POS
Antibody Screen: NEGATIVE

## 2020-01-24 SURGERY — HYSTERECTOMY, VAGINAL, LAPAROSCOPY-ASSISTED, WITH SALPINGO-OOPHORECTOMY
Anesthesia: General | Site: Vagina | Laterality: Bilateral

## 2020-01-24 MED ORDER — OXYCODONE HCL 5 MG PO TABS
ORAL_TABLET | ORAL | Status: AC
  Filled 2020-01-24: qty 1

## 2020-01-24 MED ORDER — BUPIVACAINE HCL (PF) 0.25 % IJ SOLN
INTRAMUSCULAR | Status: DC | PRN
  Administered 2020-01-24: 6 mL

## 2020-01-24 MED ORDER — CLINDAMYCIN PHOSPHATE 900 MG/50ML IV SOLN
900.0000 mg | INTRAVENOUS | Status: AC
  Administered 2020-01-24: 900 mg via INTRAVENOUS

## 2020-01-24 MED ORDER — ONDANSETRON HCL 4 MG/2ML IJ SOLN: 4.0000 mg | Freq: Four times a day (QID) | INTRAMUSCULAR | Status: DC | PRN

## 2020-01-24 MED ORDER — ACETAMINOPHEN 10 MG/ML IV SOLN: 1000.0000 mg | Freq: Once | INTRAVENOUS | Status: DC | PRN

## 2020-01-24 MED ORDER — DEXAMETHASONE SODIUM PHOSPHATE 10 MG/ML IJ SOLN
INTRAMUSCULAR | Status: AC
  Filled 2020-01-24: qty 1

## 2020-01-24 MED ORDER — SCOPOLAMINE 1 MG/3DAYS TD PT72
1.0000 | MEDICATED_PATCH | TRANSDERMAL | Status: DC
  Administered 2020-01-24: 1.5 mg via TRANSDERMAL

## 2020-01-24 MED ORDER — DIPHENHYDRAMINE HCL 12.5 MG/5ML PO ELIX: 12.5000 mg | ORAL_SOLUTION | Freq: Four times a day (QID) | ORAL | Status: DC | PRN

## 2020-01-24 MED ORDER — PROPOFOL 10 MG/ML IV BOLUS
INTRAVENOUS | Status: DC | PRN
  Administered 2020-01-24: 180 mg via INTRAVENOUS

## 2020-01-24 MED ORDER — DEXAMETHASONE SODIUM PHOSPHATE 10 MG/ML IJ SOLN
INTRAMUSCULAR | Status: DC | PRN
  Administered 2020-01-24: 10 mg via INTRAVENOUS

## 2020-01-24 MED ORDER — LIDOCAINE 2% (20 MG/ML) 5 ML SYRINGE
INTRAMUSCULAR | Status: AC
  Filled 2020-01-24: qty 5

## 2020-01-24 MED ORDER — ORAL CARE MOUTH RINSE: 15.0000 mL | Freq: Once | OROMUCOSAL | Status: AC

## 2020-01-24 MED ORDER — CLINDAMYCIN PHOSPHATE 900 MG/50ML IV SOLN
INTRAVENOUS | Status: AC
  Filled 2020-01-24: qty 50

## 2020-01-24 MED ORDER — OXYCODONE-ACETAMINOPHEN 5-325 MG PO TABS
ORAL_TABLET | ORAL | Status: AC
  Filled 2020-01-24: qty 1

## 2020-01-24 MED ORDER — SODIUM CHLORIDE 0.9% FLUSH: 9.0000 mL | INTRAVENOUS | Status: DC | PRN

## 2020-01-24 MED ORDER — NALOXONE HCL 0.4 MG/ML IJ SOLN: 0.4000 mg | INTRAMUSCULAR | Status: DC | PRN

## 2020-01-24 MED ORDER — MIDAZOLAM HCL 2 MG/2ML IJ SOLN
INTRAMUSCULAR | Status: AC
  Filled 2020-01-24: qty 2

## 2020-01-24 MED ORDER — ALBUTEROL SULFATE HFA 108 (90 BASE) MCG/ACT IN AERS: 2.0000 | INHALATION_SPRAY | Freq: Four times a day (QID) | RESPIRATORY_TRACT | Status: DC | PRN

## 2020-01-24 MED ORDER — LACTATED RINGERS IV SOLN
INTRAVENOUS | Status: DC
  Administered 2020-01-24: 125 mL/h via INTRAVENOUS

## 2020-01-24 MED ORDER — HYDROMORPHONE HCL 1 MG/ML IJ SOLN
INTRAMUSCULAR | Status: AC
  Filled 2020-01-24: qty 1

## 2020-01-24 MED ORDER — OXYCODONE HCL 5 MG/5ML PO SOLN: 5.0000 mg | Freq: Once | ORAL | Status: AC | PRN

## 2020-01-24 MED ORDER — HYDROMORPHONE HCL 1 MG/ML IJ SOLN
0.2500 mg | INTRAMUSCULAR | Status: DC | PRN
  Administered 2020-01-24 (×2): 0.5 mg via INTRAVENOUS

## 2020-01-24 MED ORDER — HYDROMORPHONE HCL 1 MG/ML IJ SOLN
INTRAMUSCULAR | Status: DC | PRN
  Administered 2020-01-24: 1 mg via INTRAVENOUS
  Administered 2020-01-24 (×2): .5 mg via INTRAVENOUS

## 2020-01-24 MED ORDER — DROPERIDOL 2.5 MG/ML IJ SOLN
INTRAMUSCULAR | Status: DC | PRN
Start: 2020-01-24 — End: 2020-01-24
  Administered 2020-01-24: .625 mg via INTRAVENOUS

## 2020-01-24 MED ORDER — OXYCODONE HCL 5 MG PO TABS
5.0000 mg | ORAL_TABLET | Freq: Once | ORAL | Status: AC | PRN
  Administered 2020-01-24: 5 mg via ORAL

## 2020-01-24 MED ORDER — MIDAZOLAM HCL 5 MG/5ML IJ SOLN
INTRAMUSCULAR | Status: DC | PRN
  Administered 2020-01-24: 2 mg via INTRAVENOUS

## 2020-01-24 MED ORDER — CIPROFLOXACIN IN D5W 400 MG/200ML IV SOLN
400.0000 mg | INTRAVENOUS | Status: AC
  Administered 2020-01-24: 08:00:00 400 mg via INTRAVENOUS

## 2020-01-24 MED ORDER — LIDOCAINE 2% (20 MG/ML) 5 ML SYRINGE
INTRAMUSCULAR | Status: DC | PRN
  Administered 2020-01-24: 100 mg via INTRAVENOUS

## 2020-01-24 MED ORDER — DIPHENHYDRAMINE HCL 50 MG/ML IJ SOLN: 12.5000 mg | Freq: Four times a day (QID) | INTRAMUSCULAR | Status: DC | PRN

## 2020-01-24 MED ORDER — PHENYLEPHRINE 40 MCG/ML (10ML) SYRINGE FOR IV PUSH (FOR BLOOD PRESSURE SUPPORT)
PREFILLED_SYRINGE | INTRAVENOUS | Status: AC
  Filled 2020-01-24: qty 10

## 2020-01-24 MED ORDER — SODIUM CHLORIDE 0.9 % IR SOLN
Status: DC | PRN
  Administered 2020-01-24: 1000 mL via INTRAVESICAL

## 2020-01-24 MED ORDER — DEXMEDETOMIDINE HCL IN NACL 200 MCG/50ML IV SOLN
INTRAVENOUS | Status: DC | PRN
  Administered 2020-01-24: 8 ug via INTRAVENOUS
  Administered 2020-01-24: 4 ug via INTRAVENOUS
  Administered 2020-01-24: 8 ug via INTRAVENOUS
  Administered 2020-01-24 (×2): 4 ug via INTRAVENOUS

## 2020-01-24 MED ORDER — PROPOFOL 10 MG/ML IV BOLUS
INTRAVENOUS | Status: AC
  Filled 2020-01-24: qty 40

## 2020-01-24 MED ORDER — ALPRAZOLAM 0.5 MG PO TABS
ORAL_TABLET | ORAL | Status: AC
  Filled 2020-01-24: qty 1

## 2020-01-24 MED ORDER — ROCURONIUM BROMIDE 10 MG/ML (PF) SYRINGE
PREFILLED_SYRINGE | INTRAVENOUS | Status: DC | PRN
  Administered 2020-01-24: 10 mg via INTRAVENOUS
  Administered 2020-01-24: 50 mg via INTRAVENOUS

## 2020-01-24 MED ORDER — LACTATED RINGERS IV SOLN: INTRAVENOUS | Status: DC

## 2020-01-24 MED ORDER — PHENYLEPHRINE 40 MCG/ML (10ML) SYRINGE FOR IV PUSH (FOR BLOOD PRESSURE SUPPORT)
PREFILLED_SYRINGE | INTRAVENOUS | Status: DC | PRN
  Administered 2020-01-24: 40 ug via INTRAVENOUS
  Administered 2020-01-24 (×3): 80 ug via INTRAVENOUS

## 2020-01-24 MED ORDER — SUGAMMADEX SODIUM 200 MG/2ML IV SOLN
INTRAVENOUS | Status: DC | PRN
Start: 2020-01-24 — End: 2020-01-24
  Administered 2020-01-24: 140 mg via INTRAVENOUS

## 2020-01-24 MED ORDER — OXYCODONE-ACETAMINOPHEN 5-325 MG PO TABS
1.0000 | ORAL_TABLET | ORAL | Status: DC | PRN
  Administered 2020-01-24 (×2): 1 via ORAL
  Administered 2020-01-25 (×2): 2 via ORAL

## 2020-01-24 MED ORDER — CIPROFLOXACIN IN D5W 400 MG/200ML IV SOLN
INTRAVENOUS | Status: AC
  Filled 2020-01-24: qty 200

## 2020-01-24 MED ORDER — ONDANSETRON HCL 4 MG/2ML IJ SOLN
INTRAMUSCULAR | Status: AC
  Filled 2020-01-24: qty 2

## 2020-01-24 MED ORDER — DROPERIDOL 2.5 MG/ML IJ SOLN
INTRAMUSCULAR | Status: AC
  Filled 2020-01-24: qty 2

## 2020-01-24 MED ORDER — HYDROMORPHONE 1 MG/ML IV SOLN
INTRAVENOUS | Status: DC
  Administered 2020-01-24: 30 mg via INTRAVENOUS
  Administered 2020-01-24: 0 via INTRAVENOUS
  Filled 2020-01-24 (×2): qty 30

## 2020-01-24 MED ORDER — ALPRAZOLAM 0.5 MG PO TABS
0.5000 mg | ORAL_TABLET | Freq: Every day | ORAL | Status: DC
  Administered 2020-01-24: 0.5 mg via ORAL

## 2020-01-24 MED ORDER — MENTHOL 3 MG MT LOZG: 1.0000 | LOZENGE | OROMUCOSAL | Status: DC | PRN

## 2020-01-24 MED ORDER — HYDROMORPHONE HCL 2 MG/ML IJ SOLN
INTRAMUSCULAR | Status: AC
  Filled 2020-01-24: qty 2

## 2020-01-24 MED ORDER — TOPIRAMATE 100 MG PO TABS
100.0000 mg | ORAL_TABLET | Freq: Every day | ORAL | Status: DC
  Administered 2020-01-24: 100 mg via ORAL
  Filled 2020-01-24: qty 1

## 2020-01-24 MED ORDER — CHLORHEXIDINE GLUCONATE 0.12 % MT SOLN
15.0000 mL | Freq: Once | OROMUCOSAL | Status: AC
  Administered 2020-01-24: 15 mL via OROMUCOSAL
  Filled 2020-01-24 (×2): qty 15

## 2020-01-24 MED ORDER — PROMETHAZINE HCL 25 MG/ML IJ SOLN: 6.2500 mg | INTRAMUSCULAR | Status: DC | PRN

## 2020-01-24 MED ORDER — DEXMEDETOMIDINE HCL IN NACL 200 MCG/50ML IV SOLN
INTRAVENOUS | Status: AC
  Filled 2020-01-24: qty 50

## 2020-01-24 MED ORDER — SCOPOLAMINE 1 MG/3DAYS TD PT72
MEDICATED_PATCH | TRANSDERMAL | Status: AC
  Filled 2020-01-24: qty 1

## 2020-01-24 MED ORDER — ONDANSETRON HCL 4 MG PO TABS: 4.0000 mg | ORAL_TABLET | Freq: Four times a day (QID) | ORAL | Status: DC | PRN

## 2020-01-24 MED ORDER — ROCURONIUM BROMIDE 10 MG/ML (PF) SYRINGE
PREFILLED_SYRINGE | INTRAVENOUS | Status: AC
  Filled 2020-01-24: qty 10

## 2020-01-24 SURGICAL SUPPLY — 60 items
ADH SKN CLS APL DERMABOND .7 (GAUZE/BANDAGES/DRESSINGS) ×1
BAG RETRIEVAL 10 (BASKET)
BLADE CLIPPER SENSICLIP SURGIC (BLADE) IMPLANT
CANISTER SUCT 3000ML PPV (MISCELLANEOUS) ×6 IMPLANT
CATH ROBINSON RED A/P 16FR (CATHETERS) ×3 IMPLANT
COVER BACK TABLE 60X90IN (DRAPES) ×3 IMPLANT
COVER MAYO STAND STRL (DRAPES) ×6 IMPLANT
COVER SURGICAL LIGHT HANDLE (MISCELLANEOUS) IMPLANT
COVER WAND RF STERILE (DRAPES) ×3 IMPLANT
DERMABOND ADVANCED (GAUZE/BANDAGES/DRESSINGS) ×1
DERMABOND ADVANCED .7 DNX12 (GAUZE/BANDAGES/DRESSINGS) ×2 IMPLANT
DRSG COVADERM PLUS 2X2 (GAUZE/BANDAGES/DRESSINGS) IMPLANT
DRSG OPSITE POSTOP 3X4 (GAUZE/BANDAGES/DRESSINGS) ×2 IMPLANT
DURAPREP 26ML APPLICATOR (WOUND CARE) ×3 IMPLANT
ELECT REM PT RETURN 9FT ADLT (ELECTROSURGICAL) ×2
ELECTRODE REM PT RTRN 9FT ADLT (ELECTROSURGICAL) ×2 IMPLANT
GAUZE 4X4 16PLY RFD (DISPOSABLE) ×3 IMPLANT
GLOVE BIO SURGEON STRL SZ7 (GLOVE) ×12 IMPLANT
GLOVE ECLIPSE 6.5 STRL STRAW (GLOVE) ×3 IMPLANT
HOLDER FOLEY CATH W/STRAP (MISCELLANEOUS) ×3 IMPLANT
KIT TURNOVER CYSTO (KITS) ×3 IMPLANT
LEGGING LITHOTOMY PAIR STRL (DRAPES) ×1 IMPLANT
NEEDLE INSUFFLATION 120MM (ENDOMECHANICALS) IMPLANT
NS IRRIG 500ML POUR BTL (IV SOLUTION) ×3 IMPLANT
PACK LAVH (CUSTOM PROCEDURE TRAY) ×3 IMPLANT
PACK ROBOTIC GOWN (GOWN DISPOSABLE) ×1 IMPLANT
PACK TRENDGUARD 450 HYBRID PRO (MISCELLANEOUS) IMPLANT
PAD OB MATERNITY 4.3X12.25 (PERSONAL CARE ITEMS) ×3 IMPLANT
PAD PREP 24X48 CUFFED NSTRL (MISCELLANEOUS) ×3 IMPLANT
SCISSORS LAP 5X45 EPIX DISP (ENDOMECHANICALS) IMPLANT
SEALER TISSUE G2 CVD JAW 45CM (ENDOMECHANICALS) ×3 IMPLANT
SET IRRIG Y TYPE TUR BLADDER L (SET/KITS/TRAYS/PACK) ×1 IMPLANT
SET SUCTION IRRIG HYDROSURG (IRRIGATION / IRRIGATOR) ×3 IMPLANT
SET TUBE SMOKE EVAC HIGH FLOW (TUBING) ×3 IMPLANT
SOLUTION ELECTROLUBE (MISCELLANEOUS) IMPLANT
STRIP CLOSURE SKIN 1/4X4 (GAUZE/BANDAGES/DRESSINGS) IMPLANT
SUT VIC AB 0 CT1 18XCR BRD8 (SUTURE) ×4 IMPLANT
SUT VIC AB 0 CT1 36 (SUTURE) ×6 IMPLANT
SUT VIC AB 0 CT1 8-18 (SUTURE) ×6
SUT VIC AB 2-0 CT1 (SUTURE) IMPLANT
SUT VIC AB 2-0 SH 27 (SUTURE)
SUT VIC AB 2-0 SH 27XBRD (SUTURE) IMPLANT
SUT VIC AB 3-0 SH 27 (SUTURE)
SUT VIC AB 3-0 SH 27X BRD (SUTURE) IMPLANT
SUT VICRYL 0 UR6 27IN ABS (SUTURE) ×1 IMPLANT
SUT VICRYL 1 TIES 12X18 (SUTURE) ×3 IMPLANT
SUT VICRYL 4-0 PS2 18IN ABS (SUTURE) ×3 IMPLANT
SYR BULB IRRIG 60ML STRL (SYRINGE) IMPLANT
SYS BAG RETRIEVAL 10MM (BASKET)
SYSTEM BAG RETRIEVAL 10MM (BASKET) IMPLANT
TOWEL OR 17X26 10 PK STRL BLUE (TOWEL DISPOSABLE) ×6 IMPLANT
TRAY FOLEY W/BAG SLVR 14FR (SET/KITS/TRAYS/PACK) ×3 IMPLANT
TRENDGUARD 450 HYBRID PRO PACK (MISCELLANEOUS) ×2
TROCAR BALLN 12MMX100 BLUNT (TROCAR) ×1 IMPLANT
TROCAR BLADELESS OPT 12M 100M (ENDOMECHANICALS) IMPLANT
TROCAR OPTI TIP 5M 100M (ENDOMECHANICALS) ×3 IMPLANT
TROCAR XCEL DIL TIP R 11M (ENDOMECHANICALS) IMPLANT
TUBE CONNECTING 12X1/4 (SUCTIONS) IMPLANT
WARMER LAPAROSCOPE (MISCELLANEOUS) ×3 IMPLANT
WATER STERILE IRR 500ML POUR (IV SOLUTION) ×3 IMPLANT

## 2020-01-24 NOTE — H&P (Signed)
  History and physical exam unchanged 

## 2020-01-24 NOTE — Anesthesia Procedure Notes (Signed)
Procedure Name: Intubation Date/Time: 01/24/2020 7:40 AM Performed by: Bonney Aid, CRNA Pre-anesthesia Checklist: Patient identified, Emergency Drugs available, Suction available and Patient being monitored Patient Re-evaluated:Patient Re-evaluated prior to induction Oxygen Delivery Method: Circle system utilized Preoxygenation: Pre-oxygenation with 100% oxygen Induction Type: IV induction Ventilation: Mask ventilation without difficulty Laryngoscope Size: Mac and 3 Grade View: Grade I Tube type: Oral Tube size: 7.0 mm Number of attempts: 1 Airway Equipment and Method: Stylet Placement Confirmation: ETT inserted through vocal cords under direct vision,  positive ETCO2 and breath sounds checked- equal and bilateral Secured at: 21 cm Tube secured with: Tape Dental Injury: Teeth and Oropharynx as per pre-operative assessment

## 2020-01-24 NOTE — Progress Notes (Signed)
Wasted 28 ml of dilaudid pca syringe in stericycle.  Witnessed by Obie Dredge, RN

## 2020-01-24 NOTE — Progress Notes (Signed)
Dr Radene Knee was by to see patient.  He said we could DC foley.

## 2020-01-24 NOTE — Op Note (Signed)
NAME: Sheila [name], Sheila A. MEDICAL RECORD [id number] ACCOUNT [id number] DATE OF BIRTH:1966-04-29 FACILITY: [facility] LOCATION: [facility] PHYSICIAN:Aerilyn Slee Sherran Needs, MD  OPERATIVE REPORT  DATE OF PROCEDURE:  01/24/2020  PREOPERATIVE DIAGNOSES: 1.  Abnormal uterine bleeding. 2.  Multiple individuals with breast cancer with elevated lifetime risk. 3.  Mildly elevated CA-125 with negative workup.  POSTOPERATIVE DIAGNOSES: 1.  Abnormal uterine bleeding. 2.  Multiple individuals with breast cancer with elevated lifetime risk. 3.  Mildly elevated CA-125 with negative workup.  OPERATIVE PROCEDURES:  Laparoscopic assisted vaginal hysterectomy, bilateral salpingo-oophorectomy and cystoscopy.  SURGEON:  Darlyn Chamber, MD  ASSISTANT:  Lucillie Garfinkel, MD     ANESTHESIA:  General endotracheal.  ESTIMATED BLOOD LOSS:  300 mL.  PACKS AND DRAINS:  Include urethral Foley.  COMPLICATIONS:  None.  INDICATIONS:  Dictated in history and physical.  DESCRIPTION OF PROCEDURE:  The patient was taken to the OR and placed in supine position.  After satisfactory level of general endotracheal anesthesia was obtained, the patient was placed in the dorsal lithotomy position using the Allen stirrups.   Perineum and vagina were prepped with Hibiclens.  A Hulka tenaculum was put in place and secured.  Bladder was entered via catheterization.  Abdomen was prepped with DuraPrep.  After a period of time she was draped in sterile field.  Subumbilical  incision made with knife, extended through the subcutaneous tissue.  Fascia was identified and sharp incision to the fascia laterally.  Peritoneum was entered with blunt finger pressure.  Open laparoscopic trocar was put in place and secured.  Abdomen  was insufflated with carbon dioxide.  Visualization revealed no evidence of injury to adjacent organs.  A 5 mm trocar was put in place in suprapubic area under direct visualization.  At this point in time, we found that the  Hulka tenaculum had perforated  through the uterus.  We went back vaginally.  The Hulka tenaculum was then removed.  Speculum was placed back in the vaginal vault.  The cervix grasped with single tooth tenaculum.  We sounded the uterus.  We inserted the Hulka tenaculum and secured it.   Speculum and single tooth tenaculum removed.  At this point in time, the Hulka tenaculum was then in the right place with no signs of perforation.  The uterus was elevated.  Visualization revealed no evidence of injury to adjacent organs.  Upper abdomen including liver tip to gallbladder was clear.   Both lateral gutters including appendix were clear.  Right tube was surgically absent.  Right ovary was unremarkable.  Left tube and ovary were unremarkable.  We first elevated the right ovary.  The right ovarian vasculature was identified.  The ureter  was well below this.  Using the EnSeal the ovarian vasculature was cauterized and incised.  Mesenteric attachments were cauterized and incised up to the round ligament.  The right round ligament was then cauterized and incised.  We then went to the left  side.  Left tube and ovary were elevated.  The left uteroovarian vessels were then cauterized and incised.  The peritoneal attachments of the tubes and ovaries were cauterized and incised up to the left round ligament.  Left round ligament was then  cauterized and incised.  Again, the ureter was well below our operative field.  At this point in time, we visualized and had no active bleeding.  Decision was to go vaginally.  Abdomen was deflated of carbon dioxide.  Laparoscope was then removed.  The patient's legs were repositioned.  A Hulka tenaculum was then removed.  Weighted speculum was placed in the vaginal vault.  Cervix grasped with Ardis Hughs tenaculum.  We attempted to enter the cul-de-sac; however, we could not.  Next, both uterosacral  ligaments were clamped, cut and suture ligated with 0 Vicryl.  The reflection of the  vaginal mucosa anteriorly was incised and we dissected the bladder superiorly.  Paracervical tissue was clamped, cut and suture ligated with 0 Vicryl.  At this point in  time, the cul-de-sac was entered sharply.  A Long nose weighted speculum was put in place for retraction.  The bladder flap was then developed.  We entered the vesicouterine space easily.  There was no evidence of injury to the bladder.  Retractor was  put in place to retract the bladder superiorly.  Using the clamp, cut, and tie technique with suture ligatures of 0 Vicryl, the parametrium was serially separated from the sides of the uterus.  Uterus was then flipped and remaining pedicles were clamped  and cut.  The uterus, ovaries and left tube were then passed off the operative field.  Held pedicles secured with free ties of 0 Vicryl.  Posterior vaginal cuff was run with a running locking suture of 0 Vicryl.  Areas of bleeding were all controlled  with figure-of-eights of 0 Vicryl.  The vaginal mucosa was reapproximated with interrupted figure-of-eights of 0 Vicryl.  We had good hemostasis.  A sponge was put in place.  At this point in time, we did cystoscopy.  The bladder was intact with no evidence of injury.  Urine output was clear.  Both ureteral orifices were identified and noted to have jets of clear urine coming from each one.  The cystoscope was then removed  and Foley placed to straight drain.  Laparoscope was reintroduced.  Visualization revealed minimal oozing from the vaginal cuff brought under control with the bipolar.  Both areas of ovarian removal were hemostatically intact.  We thoroughly irrigated the pelvis.  There was no active  bleeding or signs of complications.  The abdomen was deflated of carbon dioxide.  All trocars are trocars were removed.  Subumbilical fascia closed with 2 figure-of-eights of 0 Vicryl.  Skin was closed with interrupted subcuticulars of 4-0 Vicryl.   Suprapubic incision closed with Dermabond.   Sponge, instrument and needle counts were correct by circulating nurse multiple times.  Urine output was clear and adequate.  The patient was then taken out of dorsal lithotomy position.  Once alert and  extubated, transferred to recovery room in good condition.  CN/NUANCE  D:01/24/2020 T:01/24/2020 JOB:[id number]

## 2020-01-24 NOTE — Brief Op Note (Signed)
01/24/2020  9:17 AM  PATIENT:  Werner Lean  54 y.o. female  PRE-OPERATIVE DIAGNOSIS:  AUB, elevated CA125  POST-OPERATIVE DIAGNOSIS:  AUB, elevated CA125  PROCEDURE:  Procedure(s) with comments: LAPAROSCOPIC ASSISTED VAGINAL HYSTERECTOMY WITH BILATERAL OOPHORECTOMY AND LEFT  SALPINGECTOMY (Bilateral) - need bed  SURGEON:  Surgeon(s) and Role:    * Arvella Nigh, MD - Primary    * Royston Sinner, Colin Benton, MD - Assisting  PHYSICIAN ASSISTANT:   ASSISTANTS:leger   ANESTHESIA:   local and general  EBL:  350 mL   BLOOD ADMINISTERED:none  DRAINS: Urinary Catheter (Foley)   LOCAL MEDICATIONS USED:  XYLOCAINE   SPECIMEN:  Source of Specimen:  uterus both ovaries and left fallopia tube  DISPOSITION OF SPECIMEN:  PATHOLOGY  COUNTS:  YES  TOURNIQUET:  * No tourniquets in log *  DICTATION: .Other Dictation: Dictation Number [id number]  PLAN OF CARE: Admit for overnight observation  PATIENT DISPOSITION:  PACU - hemodynamically stable.   Delay start of Pharmacological VTE agent (>24hrs) due to surgical blood loss or risk of bleeding: no

## 2020-01-24 NOTE — Anesthesia Postprocedure Evaluation (Signed)
Anesthesia Post Note  Patient: Sheila [name]  Procedure(s) Performed: LAPAROSCOPIC ASSISTED VAGINAL HYSTERECTOMY WITH BILATERAL OOPHORECTOMY AND LEFT  SALPINGECTOMY (Bilateral Vagina )     Patient location during evaluation: PACU Anesthesia Type: General Level of consciousness: awake and alert Pain management: pain level controlled Vital Signs Assessment: post-procedure vital signs reviewed and stable Respiratory status: spontaneous breathing, nonlabored ventilation, respiratory function stable and patient connected to nasal cannula oxygen Cardiovascular status: blood pressure returned to baseline and stable Postop Assessment: no apparent nausea or vomiting Anesthetic complications: no    Last Vitals:  Vitals:   01/24/20 1120 01/24/20 1215  BP: 95/60 (!) 94/57  Pulse: 71 71  Resp: 11 12  Temp: 36.9 C 36.6 C  SpO2: 96% 97%    Last Pain:  Vitals:   01/24/20 1215  TempSrc:   PainSc: 8                  Barnet Glasgow

## 2020-01-24 NOTE — Hospital Course (Signed)
Af vss Abd soft Inc clear Good uo

## 2020-01-24 NOTE — Progress Notes (Signed)
Patient ID: Sheila [name], female   DOB: [birth year], 54 y.o.   MRN: [id number] Af vsabd soft\ Inc clear

## 2020-01-24 NOTE — Transfer of Care (Signed)
Immediate Anesthesia Transfer of Care Note  Patient: Sheila [name]  Procedure(s) Performed: LAPAROSCOPIC ASSISTED VAGINAL HYSTERECTOMY WITH BILATERAL OOPHORECTOMY AND LEFT  SALPINGECTOMY (Bilateral Vagina )  Patient Location: PACU  Anesthesia Type:General  Level of Consciousness: drowsy  Airway & Oxygen Therapy: Patient Spontanous Breathing and Patient connected to nasal cannula oxygen  Post-op Assessment: Report given to RN  Post vital signs: Reviewed and stable  Last Vitals:  Vitals Value Taken Time  BP 100/51 01/24/20 0924  Temp    Pulse 82 01/24/20 0928  Resp 12 01/24/20 0928  SpO2 99 % 01/24/20 0928  Vitals shown include unvalidated device data.  Last Pain:  Vitals:   01/24/20 0624  TempSrc: Oral  PainSc: 7       Patients Stated Pain Goal: 8 (17/47/15 9539)  Complications: No apparent anesthesia complications

## 2020-01-25 DIAGNOSIS — M797 Fibromyalgia: Secondary | ICD-10-CM | POA: Diagnosis not present

## 2020-01-25 DIAGNOSIS — E8801 Alpha-1-antitrypsin deficiency: Secondary | ICD-10-CM | POA: Diagnosis not present

## 2020-01-25 DIAGNOSIS — N800 Endometriosis of uterus: Secondary | ICD-10-CM | POA: Diagnosis not present

## 2020-01-25 DIAGNOSIS — F419 Anxiety disorder, unspecified: Secondary | ICD-10-CM | POA: Diagnosis not present

## 2020-01-25 DIAGNOSIS — Z803 Family history of malignant neoplasm of breast: Secondary | ICD-10-CM | POA: Diagnosis not present

## 2020-01-25 DIAGNOSIS — N939 Abnormal uterine and vaginal bleeding, unspecified: Secondary | ICD-10-CM | POA: Diagnosis not present

## 2020-01-25 DIAGNOSIS — R971 Elevated cancer antigen 125 [CA 125]: Secondary | ICD-10-CM | POA: Diagnosis not present

## 2020-01-25 DIAGNOSIS — M329 Systemic lupus erythematosus, unspecified: Secondary | ICD-10-CM | POA: Diagnosis not present

## 2020-01-25 DIAGNOSIS — F329 Major depressive disorder, single episode, unspecified: Secondary | ICD-10-CM | POA: Diagnosis not present

## 2020-01-25 DIAGNOSIS — N3010 Interstitial cystitis (chronic) without hematuria: Secondary | ICD-10-CM | POA: Diagnosis not present

## 2020-01-25 DIAGNOSIS — J45909 Unspecified asthma, uncomplicated: Secondary | ICD-10-CM | POA: Diagnosis not present

## 2020-01-25 DIAGNOSIS — Z90710 Acquired absence of both cervix and uterus: Secondary | ICD-10-CM | POA: Diagnosis not present

## 2020-01-25 DIAGNOSIS — K5090 Crohn's disease, unspecified, without complications: Secondary | ICD-10-CM | POA: Diagnosis not present

## 2020-01-25 DIAGNOSIS — Z79899 Other long term (current) drug therapy: Secondary | ICD-10-CM | POA: Diagnosis not present

## 2020-01-25 DIAGNOSIS — D259 Leiomyoma of uterus, unspecified: Secondary | ICD-10-CM | POA: Diagnosis not present

## 2020-01-25 LAB — CBC
HCT: 33.9 % — ABNORMAL LOW (ref 36.0–46.0)
Hemoglobin: 11 g/dL — ABNORMAL LOW (ref 12.0–15.0)
MCH: 31.2 pg (ref 26.0–34.0)
MCHC: 32.4 g/dL (ref 30.0–36.0)
MCV: 96 fL (ref 80.0–100.0)
Platelets: 276 10*3/uL (ref 150–400)
RBC: 3.53 MIL/uL — ABNORMAL LOW (ref 3.87–5.11)
RDW: 13.9 % (ref 11.5–15.5)
WBC: 11.9 10*3/uL — ABNORMAL HIGH (ref 4.0–10.5)
nRBC: 0 % (ref 0.0–0.2)

## 2020-01-25 LAB — SURGICAL PATHOLOGY

## 2020-01-25 MED ORDER — OXYCODONE-ACETAMINOPHEN 7.5-325 MG PO TABS: 1.0000 | ORAL_TABLET | Freq: Four times a day (QID) | ORAL | 0 refills | Status: AC | PRN

## 2020-01-25 MED ORDER — OXYCODONE-ACETAMINOPHEN 5-325 MG PO TABS
ORAL_TABLET | ORAL | Status: AC
  Filled 2020-01-25: qty 2

## 2020-01-25 NOTE — Discharge Instructions (Signed)
Laparoscopically Assisted Vaginal Hysterectomy, Care After This sheet gives you information about how to care for yourself after your procedure. Your health care provider may also give you more specific instructions. If you have problems or questions, contact your health care provider. What can I expect after the procedure? After the procedure, it is common to have:  Soreness and numbness in your incision areas.  Abdominal pain. You will be given pain medicine to control it.  Vaginal bleeding and discharge. You will need to use a sanitary napkin after this procedure.  Sore throat from the breathing tube that was inserted during surgery. Follow these instructions at home: Medicines  Take over-the-counter and prescription medicines only as told by your health care provider.  Do not take aspirin or ibuprofen. These medicines can cause bleeding.  Do not drive or use heavy machinery while taking prescription pain medicine.  Do not drive for 24 hours if you were given a medicine to help you relax (sedative) during the procedure. Incision care   Follow instructions from your health care provider about how to take care of your incisions. Make sure you: ? Wash your hands with soap and water before you change your bandage (dressing). If soap and water are not available, use hand sanitizer. ? Change your dressing as told by your health care provider. ? Leave stitches (sutures), skin glue, or adhesive strips in place. These skin closures may need to stay in place for 2 weeks or longer. If adhesive strip edges start to loosen and curl up, you may trim the loose edges. Do not remove adhesive strips completely unless your health care provider tells you to do that.  Check your incision area every day for signs of infection. Check for: ? Redness, swelling, or pain. ? Fluid or blood. ? Warmth. ? Pus or a bad smell. Activity  Get regular exercise as told by your health care provider. You may be  told to take short walks every day and go farther each time.  Return to your normal activities as told by your health care provider. Ask your health care provider what activities are safe for you.  Do not douche, use tampons, or have sexual intercourse for at least 6 weeks, or until your health care provider gives you permission.  Do not lift anything that is heavier than 10 lb (4.5 kg), or the limit that your health care provider tells you, until he or she says that it is safe. General instructions  Do not take baths, swim, or use a hot tub until your health care provider approves. Take showers instead of baths.  Do not drive for 24 hours if you received a sedative.  Do not drive or operate heavy machinery while taking prescription pain medicine.  To prevent or treat constipation while you are taking prescription pain medicine, your health care provider may recommend that you: ? Drink enough fluid to keep your urine clear or pale yellow. ? Take over-the-counter or prescription medicines. ? Eat foods that are high in fiber, such as fresh fruits and vegetables, whole grains, and beans. ? Limit foods that are high in fat and processed sugars, such as fried and sweet foods.  Keep all follow-up visits as told by your health care provider. This is important. Contact a health care provider if:  You have signs of infection, such as: ? Redness, swelling, or pain around your incision sites. ? Fluid or blood coming from an incision. ? An incision that feels warm to the   touch. ? Pus or a bad smell coming from an incision.  Your incision breaks open.  Your pain medicine is not helping.  You feel dizzy or light-headed.  You have pain or bleeding when you urinate.  You have persistent nausea and vomiting.  You have blood, pus, or a bad-smelling discharge from your vagina. Get help right away if:  You have a fever.  You have severe abdominal pain.  You have chest pain.  You have  shortness of breath.  You faint.  You have pain, swelling, or redness in your leg.  You have heavy bleeding from your vagina. Summary  After the procedure, it is common to have abdominal pain and vaginal bleeding.  You should not drive or lift heavy objects until your health care provider says that it is safe.  Contact your health care provider if you have any symptoms of infection, excessive vaginal bleeding, nausea, vomiting, or shortness of breath. This information is not intended to replace advice given to you by your health care provider. Make sure you discuss any questions you have with your health care provider. Document Revised: 08/15/2017 Document Reviewed: 10/29/2016 Elsevier Patient Education  2020 [employer]  

## 2020-01-25 NOTE — Discharge Summary (Signed)
Patient name Sheila [name], Sheila [name] DICTATION# [id number] CSN# [id number]  Arvella Nigh, MD 01/25/2020 8:38 AM

## 2020-01-25 NOTE — Progress Notes (Signed)
1 Day Post-Op Procedure(s) (LRB): LAPAROSCOPIC ASSISTED VAGINAL HYSTERECTOMY WITH BILATERAL OOPHORECTOMY AND LEFT  SALPINGECTOMY (Bilateral)  Subjective: Patient reports tolerating PO and no problems voiding.    Objective: I have reviewed patient's vital signs and labs.  General: alert GI: soft, non-tender; bowel sounds normal; no masses,  no organomegaly Vaginal Bleeding: minimal  Assessment: s/p Procedure(s) with comments: LAPAROSCOPIC ASSISTED VAGINAL HYSTERECTOMY WITH BILATERAL OOPHORECTOMY AND LEFT  SALPINGECTOMY (Bilateral) - need bed: stable  Plan: Discharge home  LOS: 0 days    Arvella Nigh 01/25/2020, 8:37 AM

## 2020-01-25 NOTE — Discharge Summary (Signed)
NAME: Sheila [name], Sheila A. MEDICAL RECORD UL:[id number] ACCOUNT [id number] DATE OF BIRTH:21-Jan-1966 FACILITY: [facility] LOCATION: [facility] PHYSICIAN:Rolly Magri Sherran Needs, MD  DISCHARGE SUMMARY  DATE OF DISCHARGE:  01/25/2020  DATE OF ADMISSION:  01/24/2020  DATE OF DISCHARGE:  01/25/2020  ADMITTING DIAGNOSIS:  Abnormal uterine bleeding with elevated CA-125.  DISCHARGE DIAGNOSIS:  Abnormal uterine bleeding with elevated CA-125.  OPERATIVE PROCEDURES:  Laparoscopic assisted vaginal hysterectomy, bilateral salpingo-oophorectomy and cystoscopy.  For a complete history and physical, please see dictated note.  HOSPITAL COURSE:  The patient underwent above noted surgery.  It went well.  Her right tube was surgically absent.  Pathology is pending.  Postoperatively, her hemoglobin was 11.9.  On her 1st postop day, she was afebrile with stable vital signs.   Abdomen was soft.  Bowel sounds were active.  All incisions were intact.  She was having minimal vaginal spotting.  Was voiding without difficulty.  She was tolerating her diet.  COMPLICATIONS:  None were encountered during stay in the hospital.  The patient discharged home in stable condition.  DISPOSITION:  The patient is to avoid heavy lifting, vaginal entrance or driving of a car.  She is instructed to call with any signs of fever.  Nausea, vomiting should be reported.  Active vaginal bleeding should be reported.  Increasing abdominal pain  should also be reported.  Signs and symptoms of deep venous thrombosis and pulmonary embolus are discussed.  The patient is discharged home on Percocet if needed for pain.  Office will call her tomorrow and arrange followup in 1 week.  CN/NUANCE D:01/25/2020 T:01/25/2020 JOB:[id number]

## 2020-02-03 DIAGNOSIS — R309 Painful micturition, unspecified: Secondary | ICD-10-CM | POA: Diagnosis not present

## 2020-02-15 DIAGNOSIS — K6289 Other specified diseases of anus and rectum: Secondary | ICD-10-CM | POA: Diagnosis not present

## 2020-02-15 DIAGNOSIS — N9489 Other specified conditions associated with female genital organs and menstrual cycle: Secondary | ICD-10-CM | POA: Diagnosis not present

## 2020-02-15 DIAGNOSIS — N3010 Interstitial cystitis (chronic) without hematuria: Secondary | ICD-10-CM | POA: Diagnosis not present

## 2020-02-15 DIAGNOSIS — K582 Mixed irritable bowel syndrome: Secondary | ICD-10-CM | POA: Diagnosis not present

## 2020-02-21 DIAGNOSIS — Z09 Encounter for follow-up examination after completed treatment for conditions other than malignant neoplasm: Secondary | ICD-10-CM | POA: Diagnosis not present

## 2020-02-21 DIAGNOSIS — Z1231 Encounter for screening mammogram for malignant neoplasm of breast: Secondary | ICD-10-CM | POA: Diagnosis not present

## 2020-02-21 DIAGNOSIS — R319 Hematuria, unspecified: Secondary | ICD-10-CM | POA: Diagnosis not present

## 2020-02-24 ENCOUNTER — Telehealth: Payer: Self-pay | Admitting: Critical Care Medicine

## 2020-02-24 NOTE — Telephone Encounter (Signed)
I called pt and advised her of her results from Dr. Carlis Abbott. She understood.  I have written the letter for medical necessity and have it pended. [employer] was not open since it is after 5pm. I will have to call in the morning to get the fax number and the correct department to fax this letter for pt. Will hold in triage until tomorrow.

## 2020-02-24 NOTE — Telephone Encounter (Signed)
I'm sorry, I didn't get the PSG result in my inbox for some reason. She does not have sleep apnea. This is helpful to know in evaluating her chronic fatigue. I think this is a medically necessary test to rule out OSA as a cause of SOB and chronic fatigue.  Your PFTs are normal, which is great news.   We can write a letter stating that the PSG was medically necessary to evaluate her symptoms.  Julian Hy, DO 02/24/20 11:48 AM [facility]

## 2020-02-24 NOTE — Telephone Encounter (Signed)
Called and spoke with Patient. Patient stated she has never received results from her PFT or sleep study. Patient also stated she received a letter from [employer], that a signed letter from Dr Carlis Abbott is required to show medical necessity reason Patient needed to have a sleep study. Patient was not given a fax number, but had contact number. [employer] contact  number- [phone].   Message routed to Dr Carlis Abbott to advise on results and letter

## 2020-02-24 NOTE — Telephone Encounter (Signed)
Patient is returning phone call. Patient phone number is [phone].

## 2020-02-24 NOTE — Telephone Encounter (Signed)
ATC pt, no answer. Left message for pt to call back.  

## 2020-02-25 ENCOUNTER — Other Ambulatory Visit: Payer: Self-pay

## 2020-02-25 DIAGNOSIS — G43009 Migraine without aura, not intractable, without status migrainosus: Secondary | ICD-10-CM

## 2020-02-25 MED ORDER — TIZANIDINE HCL 4 MG PO TABS: ORAL_TABLET | ORAL | 1 refills | Status: DC

## 2020-02-25 NOTE — Telephone Encounter (Signed)
I called [employer] and spoke with [place]. He informed me to fax the letter and records to the number below. Medical review team [phone].   Will await Dr. Carlis Abbott signature. Please review letter and advise if anything needs to be added or revised. I will place in your folder.   Pt made aware.

## 2020-02-29 ENCOUNTER — Other Ambulatory Visit: Payer: Self-pay

## 2020-02-29 DIAGNOSIS — G43009 Migraine without aura, not intractable, without status migrainosus: Secondary | ICD-10-CM

## 2020-02-29 MED ORDER — TOPIRAMATE 100 MG PO TABS: ORAL_TABLET | ORAL | 0 refills | Status: DC

## 2020-03-13 IMAGING — US US ABDOMEN LIMITED
1 series · 14 of 25 positions shown · non-contrast
Comparison: None.

CLINICAL DATA: Epigastric and right upper quadrant pain x3 days.

EXAM:
ULTRASOUND ABDOMEN LIMITED RIGHT UPPER QUADRANT

[Series 1: us abdomen limited · 0.21mm/px · 14 of 49 slices shown]
[im 1/49]
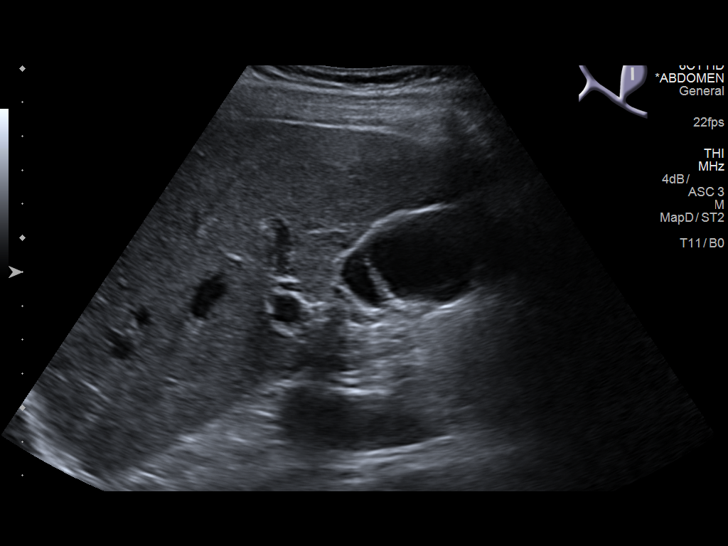
[im 5/49]
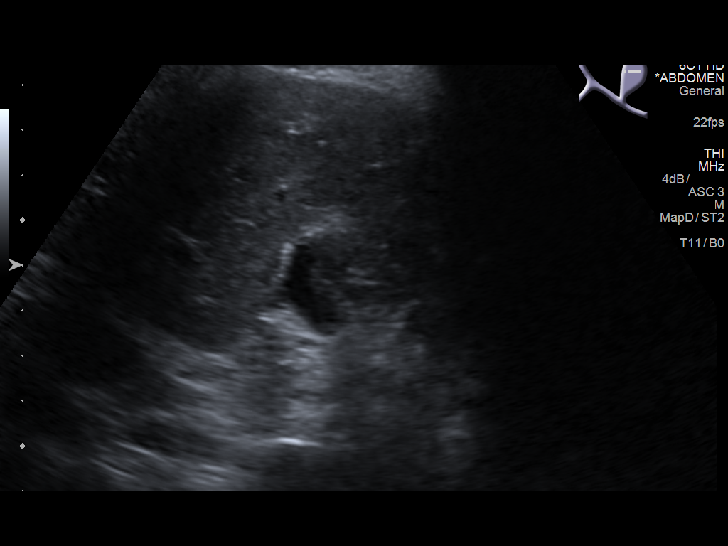
[im 9/49]
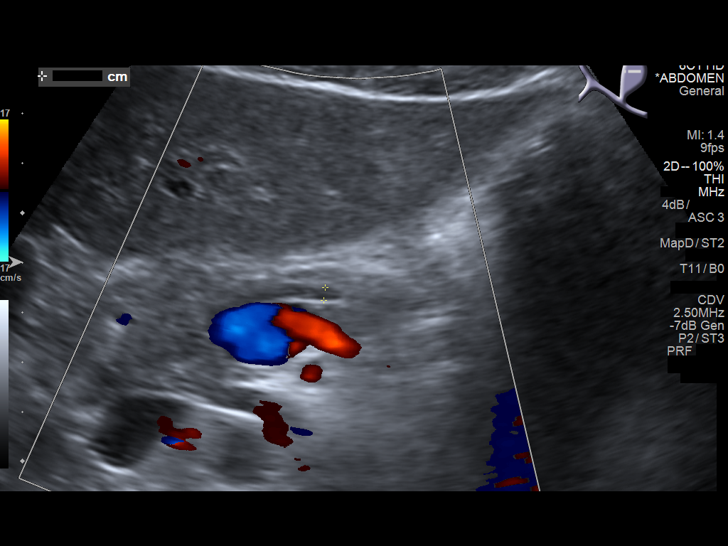
[im 13/49]
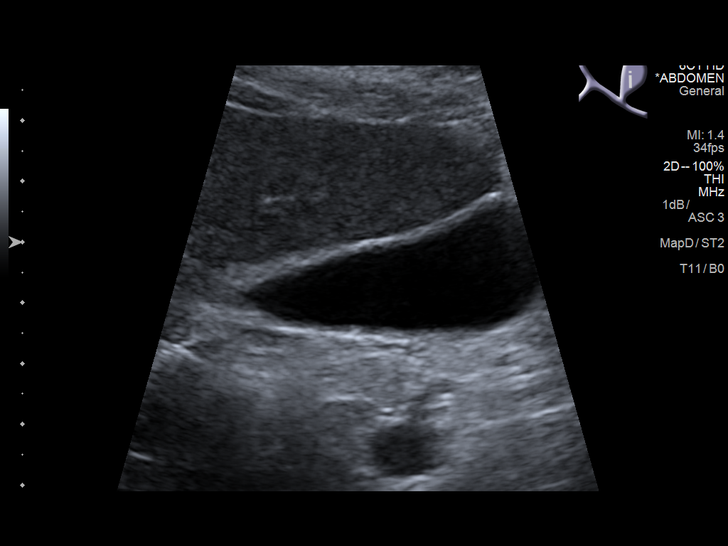
[im 17/49]
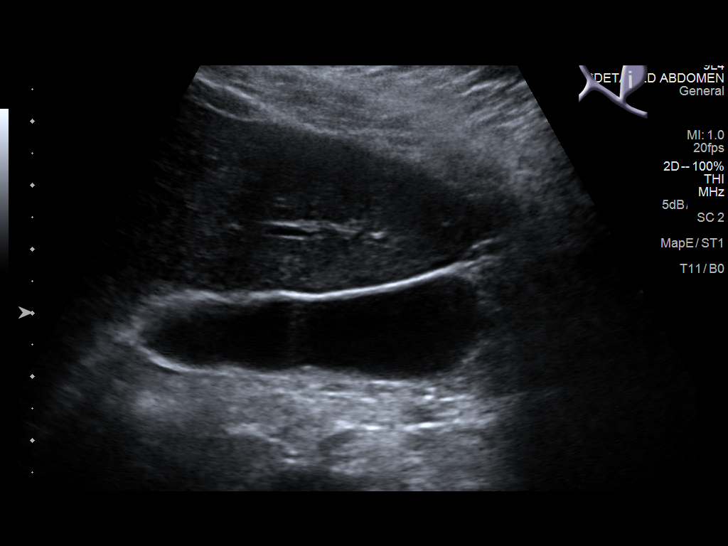
[im 19/49]
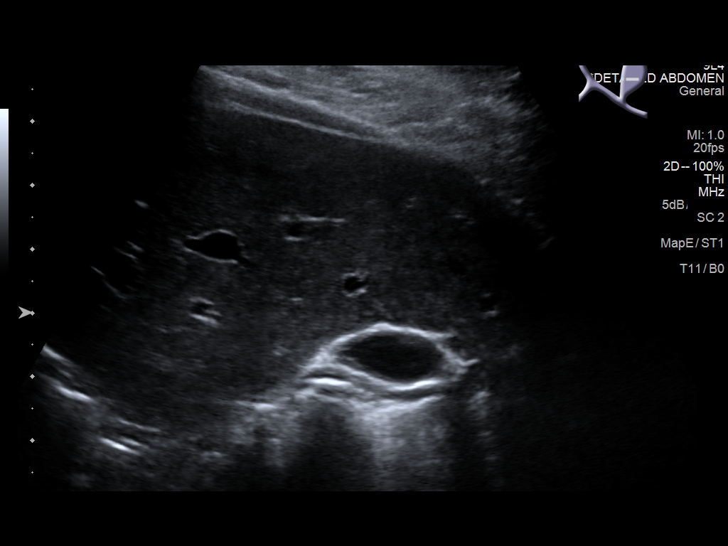
[im 23/49]
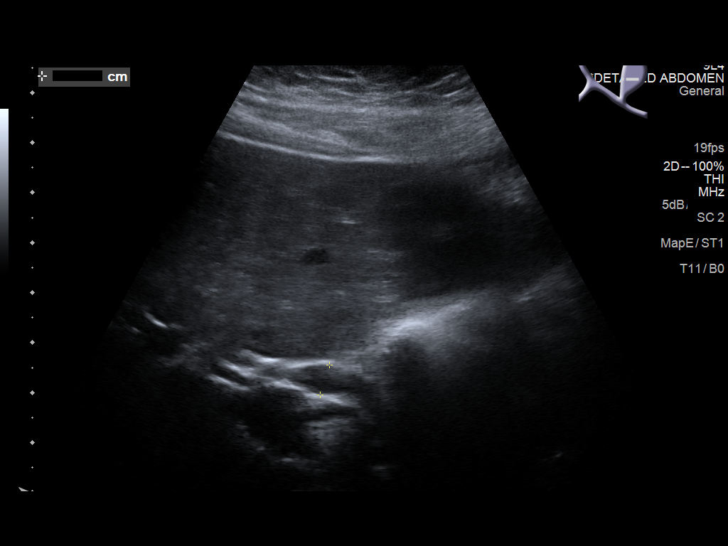
[im 27/49]
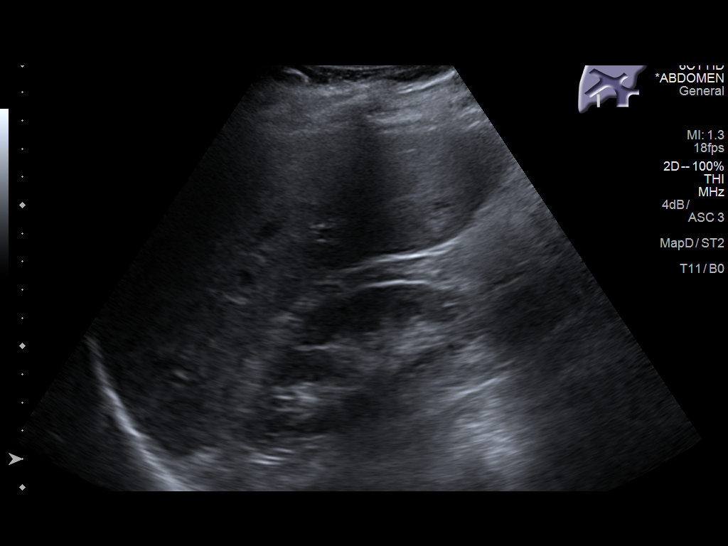
[im 31/49]
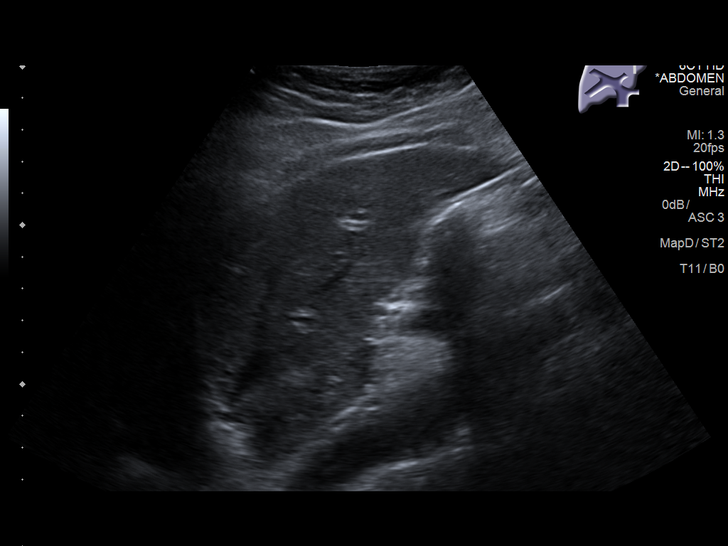
[im 33/49]
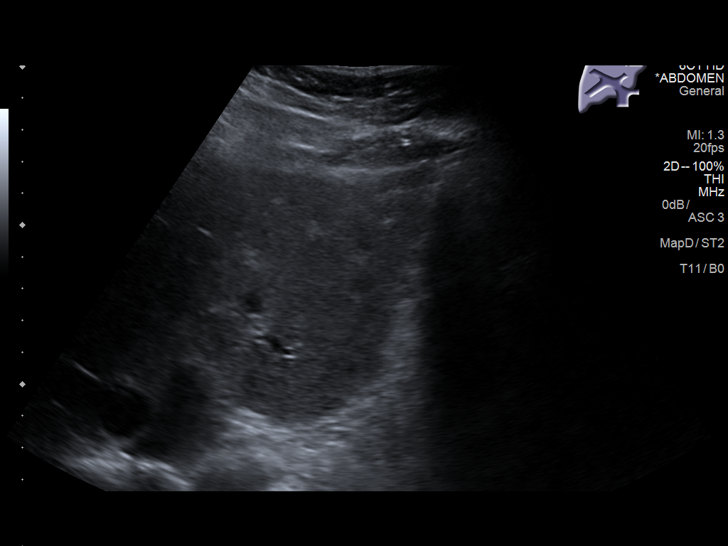
[im 37/49]
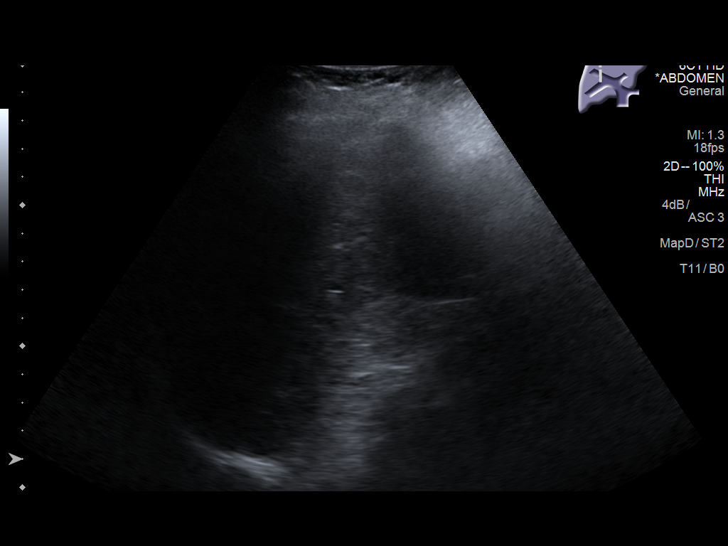
[im 41/49]
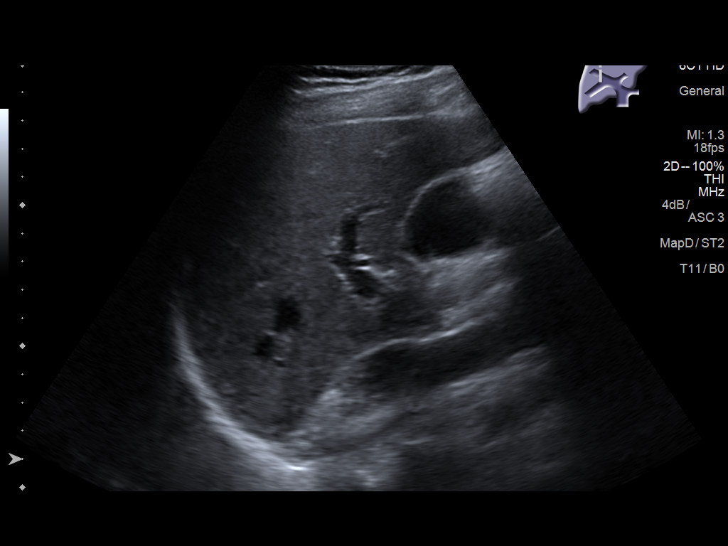
[im 45/49]
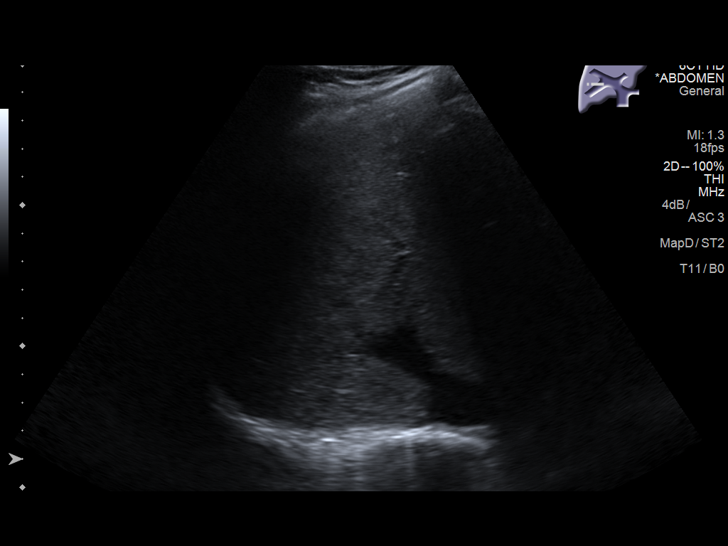
[im 49/49]
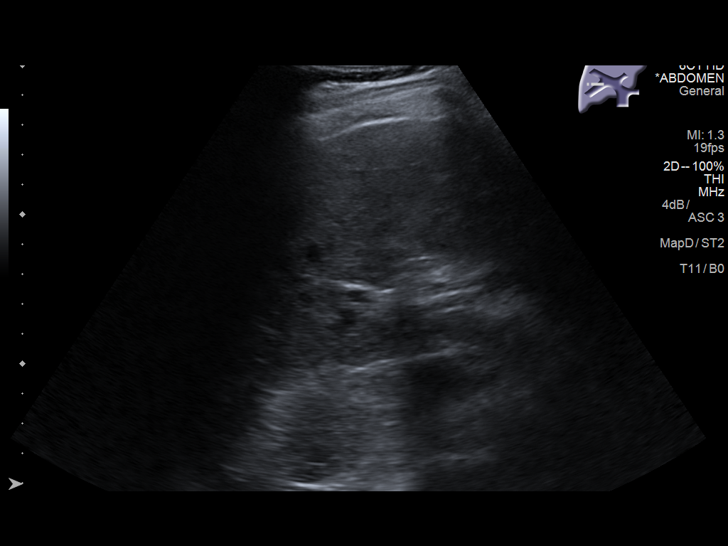

[14 of 25 positions shown; findings below may reference images not displayed]

FINDINGS: Gallbladder:

No gallstones or wall thickening visualized. No sonographic Murphy
sign noted by sonographer.

Common bile duct:

Diameter: 6 mm

Liver:

No focal lesion identified. Within normal limits in parenchymal
echogenicity. Portal vein is patent on color Doppler imaging with
normal direction of blood flow towards the liver.
IMPRESSION: Unremarkable right upper quadrant abdominal ultrasound. No
sonographic findings for the patient's pain.

## 2020-03-30 ENCOUNTER — Telehealth: Payer: Self-pay | Admitting: *Deleted

## 2020-03-30 DIAGNOSIS — G43009 Migraine without aura, not intractable, without status migrainosus: Secondary | ICD-10-CM

## 2020-03-30 MED ORDER — TOPIRAMATE 100 MG PO TABS: ORAL_TABLET | ORAL | 0 refills | Status: DC

## 2020-03-30 NOTE — Telephone Encounter (Signed)
Received fax from [facility] for Topiramate refill. 30 day supply sent. Pt last seen 10/13/19 and was advised to follow up in 1 month. Sent mychart message to pt to schedule OV soon.

## 2020-03-30 NOTE — Telephone Encounter (Signed)
Pt was seen in January and was suppose to f/u in 4 weeks. Pt is wanting to follow up virtually due to a compromised immune system. Are you okay with this? Please advise

## 2020-03-30 NOTE — Telephone Encounter (Signed)
Sheila [name] -- please advise pt about follow up. She is asking specifically for a Virtual appt and I was not sure if PCP is ok with that?

## 2020-03-31 ENCOUNTER — Other Ambulatory Visit: Payer: Self-pay | Admitting: Family Medicine

## 2020-03-31 ENCOUNTER — Other Ambulatory Visit: Payer: Self-pay

## 2020-03-31 DIAGNOSIS — M545 Low back pain: Secondary | ICD-10-CM | POA: Diagnosis not present

## 2020-03-31 NOTE — Telephone Encounter (Signed)
Can refill 1 month but she really needs to come in ----  we are not bringing sick people into office  Will do virtual if she insists and i'll talk to her then but eventually she is going to need to come in

## 2020-03-31 NOTE — Telephone Encounter (Signed)
Pt notified of refill and need for appt. Pt scheduled for a morning appt in September due to vacations and having kids in the afternoon. FYI

## 2020-04-29 ENCOUNTER — Other Ambulatory Visit: Payer: Self-pay | Admitting: Family Medicine

## 2020-04-29 DIAGNOSIS — G43009 Migraine without aura, not intractable, without status migrainosus: Secondary | ICD-10-CM

## 2020-05-18 ENCOUNTER — Encounter: Payer: Self-pay | Admitting: Family Medicine

## 2020-05-18 ENCOUNTER — Other Ambulatory Visit: Payer: Self-pay

## 2020-05-18 ENCOUNTER — Ambulatory Visit: Payer: Federal, State, Local not specified - PPO | Admitting: Family Medicine

## 2020-05-18 VITALS — BP 100/70 | HR 83 | Temp 98.9°F | Resp 18 | Ht 64.0 in | Wt 139.8 lb

## 2020-05-18 DIAGNOSIS — E785 Hyperlipidemia, unspecified: Secondary | ICD-10-CM | POA: Diagnosis not present

## 2020-05-18 DIAGNOSIS — F4310 Post-traumatic stress disorder, unspecified: Secondary | ICD-10-CM

## 2020-05-18 DIAGNOSIS — F332 Major depressive disorder, recurrent severe without psychotic features: Secondary | ICD-10-CM | POA: Diagnosis not present

## 2020-05-18 DIAGNOSIS — F410 Panic disorder [episodic paroxysmal anxiety] without agoraphobia: Secondary | ICD-10-CM | POA: Diagnosis not present

## 2020-05-18 DIAGNOSIS — K50919 Crohn's disease, unspecified, with unspecified complications: Secondary | ICD-10-CM

## 2020-05-18 DIAGNOSIS — E8801 Alpha-1-antitrypsin deficiency: Secondary | ICD-10-CM

## 2020-05-18 MED ORDER — DESVENLAFAXINE SUCCINATE ER 50 MG PO TB24: 50.0000 mg | ORAL_TABLET | Freq: Every day | ORAL | 3 refills | Status: DC

## 2020-05-18 NOTE — Assessment & Plan Note (Signed)
Start pristiq  Prn xanax F/u  1 month

## 2020-05-18 NOTE — Patient Instructions (Signed)
Panic Attack A panic attack is a sudden episode of severe anxiety, fear, or discomfort that causes physical and emotional symptoms. The attack may be in response to something frightening, or it may occur for no known reason. Symptoms of a panic attack can be similar to symptoms of a heart attack or stroke. It is important to see your health care provider when you have a panic attack so that these conditions can be ruled out. A panic attack is a symptom of another condition. Most panic attacks go away with treatment of the underlying problem. If you have panic attacks often, you may have a condition called panic disorder. What are the causes? A panic attack may be caused by:  An extreme, life-threatening situation, such as a war or natural disaster.  An anxiety disorder, such as post-traumatic stress disorder.  Depression.  Certain medical conditions, including heart problems, neurological conditions, and infections.  Certain over-the-counter and prescription medicines.  Illegal drugs that increase heart rate and blood pressure, such as methamphetamine.  Alcohol.  Supplements that increase anxiety.  Panic disorder. What increases the risk? You are more likely to develop this condition if:  You have an anxiety disorder.  You have another mental health condition.  You take certain medicines.  You use alcohol, illegal drugs, or other substances.  You are under extreme stress.  A life event is causing increased feelings of anxiety and depression. What are the signs or symptoms? A panic attack starts suddenly, usually lasts about 20 minutes, and occurs with one or more of the following:  A pounding heart.  A feeling that your heart is beating irregularly or faster than normal (palpitations).  Sweating.  Trembling or shaking.  Shortness of breath or feeling smothered.  Feeling choked.  Chest pain or discomfort.  Nausea or a strange feeling in your  stomach.  Dizziness, feeling lightheaded, or feeling like you might faint.  Chills or hot flashes.  Numbness or tingling in your lips, hands, or feet.  Feeling confused, or feeling that you are not yourself.  Fear of losing control or being emotionally unstable.  Fear of dying. How is this diagnosed? A panic attack is diagnosed with an assessment by your health care provider. During the assessment your health care provider will ask questions about:  Your history of anxiety, depression, and panic attacks.  Your medical history.  Whether you drink alcohol, use illegal drugs, take supplements, or take medicines. Be honest about your substance use. Your health care provider may also:  Order blood tests or other kinds of tests to rule out serious medical conditions.  Refer you to a mental health professional for further evaluation. How is this treated? Treatment depends on the cause of the panic attack:  If the cause is a medical problem, your health care provider will either treat that problem or refer you to a specialist.  If the cause is emotional, you may be given anti-anxiety medicines or referred to a counselor. These medicines may reduce how often attacks happen, reduce how severe the attacks are, and lower anxiety.  If the cause is a medicine, your health care provider may tell you to stop the medicine, change your dose, or take a different medicine.  If the cause is a drug, treatment may involve letting the drug wear off and taking medicine to help the drug leave your body or to counteract its effects. Attacks caused by drug abuse may continue even if you stop using the drug. Follow these instructions  at home:  Take over-the-counter and prescription medicines only as told by your health care provider.  If you feel anxious, limit your caffeine intake.  Take good care of your physical and mental health by: ? Eating a balanced diet that includes plenty of fresh fruits and  vegetables, whole grains, lean meats, and low-fat dairy. ? Getting plenty of rest. Try to get 7-8 hours of uninterrupted sleep each night. ? Exercising regularly. Try to get 30 minutes of physical activity at least 5 days a week. ? Not smoking. Talk to your health care provider if you need help quitting. ? Limiting alcohol intake to no more than 1 drink a day for nonpregnant women and 2 drinks a day for men. One drink equals 12 oz of beer, 5 oz of wine, or 1 oz of hard liquor.  Keep all follow-up visits as told by your health care provider. This is important. Panic attacks may have underlying physical or emotional problems that take time to accurately diagnose. Contact a health care provider if:  Your symptoms do not improve, or they get worse.  You are not able to take your medicine as prescribed because of side effects. Get help right away if:  You have serious thoughts about hurting yourself or others.  You have symptoms of a panic attack. Do not drive yourself to the hospital. Have someone else drive you or call an ambulance. If you ever feel like you may hurt yourself or others, or you have thoughts about taking your own life, get help right away. You can go to your nearest emergency department or call:  Your local emergency services (911 in the [place]  A suicide crisis helpline, such as the [facility] at 930 360 0591. This is open 24 hours a day. Summary  A panic attack is a sign of a serious health or mental health condition. Get help right away. Do not drive yourself to the hospital. Have someone else drive you or call an ambulance.  Always see a health care provider to have the reasons for the panic attack correctly diagnosed.  If your panic attack was caused by a physical problem, follow your health care provider's suggestions for medicine, referral to a specialist, and lifestyle changes.  If your panic attack was caused by an emotional problem,  follow through with counseling from a qualified mental health specialist.  If you feel like you may hurt yourself or others, call 911 and get help right away. This information is not intended to replace advice given to you by your health care provider. Make sure you discuss any questions you have with your health care provider. Document Revised: 08/15/2017 Document Reviewed: 10/11/2016 Elsevier Patient Education  2020 [employer].

## 2020-05-18 NOTE — Assessment & Plan Note (Signed)
Per pulmonary 
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
## 2020-05-18 NOTE — Assessment & Plan Note (Signed)
Per GI

## 2020-05-18 NOTE — Assessment & Plan Note (Signed)
Start pristiq daily F/u 1 month or sooner prn  Refer to psych  Restart counseling

## 2020-05-19 LAB — COMPREHENSIVE METABOLIC PANEL
AG Ratio: 1.5 (calc) (ref 1.0–2.5)
ALT: 10 U/L (ref 6–29)
AST: 17 U/L (ref 10–35)
Albumin: 4.5 g/dL (ref 3.6–5.1)
Alkaline phosphatase (APISO): 94 U/L (ref 37–153)
BUN: 16 mg/dL (ref 7–25)
CO2: 25 mmol/L (ref 20–32)
Calcium: 10 mg/dL (ref 8.6–10.4)
Chloride: 106 mmol/L (ref 98–110)
Creat: 0.95 mg/dL (ref 0.50–1.05)
Globulin: 3.1 g/dL (calc) (ref 1.9–3.7)
Glucose, Bld: 92 mg/dL (ref 65–99)
Potassium: 4.6 mmol/L (ref 3.5–5.3)
Sodium: 141 mmol/L (ref 135–146)
Total Bilirubin: 0.6 mg/dL (ref 0.2–1.2)
Total Protein: 7.6 g/dL (ref 6.1–8.1)

## 2020-05-19 LAB — CBC WITH DIFFERENTIAL/PLATELET
Absolute Monocytes: 657 cells/uL (ref 200–950)
Basophils Absolute: 58 cells/uL (ref 0–200)
Basophils Relative: 0.8 %
Eosinophils Absolute: 139 cells/uL (ref 15–500)
Eosinophils Relative: 1.9 %
HCT: 42.8 % (ref 35.0–45.0)
Hemoglobin: 14.3 g/dL (ref 11.7–15.5)
Lymphs Abs: 2088 cells/uL (ref 850–3900)
MCH: 29.8 pg (ref 27.0–33.0)
MCHC: 33.4 g/dL (ref 32.0–36.0)
MCV: 89.2 fL (ref 80.0–100.0)
MPV: 11.2 fL (ref 7.5–12.5)
Monocytes Relative: 9 %
Neutro Abs: 4358 cells/uL (ref 1500–7800)
Neutrophils Relative %: 59.7 %
Platelets: 316 10*3/uL (ref 140–400)
RBC: 4.8 10*6/uL (ref 3.80–5.10)
RDW: 12.9 % (ref 11.0–15.0)
Total Lymphocyte: 28.6 %
WBC: 7.3 10*3/uL (ref 3.8–10.8)

## 2020-05-19 LAB — VITAMIN B12: Vitamin B-12: 343 pg/mL (ref 200–1100)

## 2020-05-19 LAB — LIPID PANEL
Cholesterol: 256 mg/dL — ABNORMAL HIGH (ref <200)
HDL: 66 mg/dL (ref >=50)
LDL Cholesterol (Calc): 171 mg/dL (calc) — ABNORMAL HIGH
Non-HDL Cholesterol (Calc): 190 mg/dL (calc) — ABNORMAL HIGH (ref <130)
Total CHOL/HDL Ratio: 3.9 (calc) (ref <5.0)
Triglycerides: 80 mg/dL (ref <150)

## 2020-05-19 LAB — TSH: TSH: 1 mIU/L

## 2020-05-28 ENCOUNTER — Other Ambulatory Visit: Payer: Self-pay | Admitting: Family Medicine

## 2020-05-28 DIAGNOSIS — E785 Hyperlipidemia, unspecified: Secondary | ICD-10-CM

## 2020-05-29 ENCOUNTER — Other Ambulatory Visit: Payer: Self-pay | Admitting: Family Medicine

## 2020-05-29 DIAGNOSIS — G43009 Migraine without aura, not intractable, without status migrainosus: Secondary | ICD-10-CM

## 2020-05-30 ENCOUNTER — Other Ambulatory Visit: Payer: Self-pay | Admitting: *Deleted

## 2020-05-30 DIAGNOSIS — E785 Hyperlipidemia, unspecified: Secondary | ICD-10-CM

## 2020-06-13 DIAGNOSIS — K582 Mixed irritable bowel syndrome: Secondary | ICD-10-CM | POA: Diagnosis not present

## 2020-06-13 DIAGNOSIS — R3915 Urgency of urination: Secondary | ICD-10-CM | POA: Diagnosis not present

## 2020-06-13 DIAGNOSIS — N3010 Interstitial cystitis (chronic) without hematuria: Secondary | ICD-10-CM | POA: Diagnosis not present

## 2020-06-13 DIAGNOSIS — N9489 Other specified conditions associated with female genital organs and menstrual cycle: Secondary | ICD-10-CM | POA: Diagnosis not present

## 2020-06-25 ENCOUNTER — Other Ambulatory Visit: Payer: Self-pay | Admitting: Family Medicine

## 2020-06-25 DIAGNOSIS — G43009 Migraine without aura, not intractable, without status migrainosus: Secondary | ICD-10-CM

## 2020-06-26 DIAGNOSIS — L821 Other seborrheic keratosis: Secondary | ICD-10-CM | POA: Diagnosis not present

## 2020-06-26 DIAGNOSIS — C44519 Basal cell carcinoma of skin of other part of trunk: Secondary | ICD-10-CM | POA: Diagnosis not present

## 2020-06-26 DIAGNOSIS — Z1283 Encounter for screening for malignant neoplasm of skin: Secondary | ICD-10-CM | POA: Diagnosis not present

## 2020-06-26 DIAGNOSIS — D225 Melanocytic nevi of trunk: Secondary | ICD-10-CM | POA: Diagnosis not present

## 2020-06-27 ENCOUNTER — Other Ambulatory Visit: Payer: Self-pay

## 2020-06-27 ENCOUNTER — Encounter: Payer: Self-pay | Admitting: Psychiatry

## 2020-06-27 ENCOUNTER — Ambulatory Visit (INDEPENDENT_AMBULATORY_CARE_PROVIDER_SITE_OTHER): Payer: Federal, State, Local not specified - PPO | Admitting: Psychiatry

## 2020-06-27 DIAGNOSIS — F4310 Post-traumatic stress disorder, unspecified: Secondary | ICD-10-CM | POA: Diagnosis not present

## 2020-06-27 DIAGNOSIS — F410 Panic disorder [episodic paroxysmal anxiety] without agoraphobia: Secondary | ICD-10-CM | POA: Diagnosis not present

## 2020-06-27 MED ORDER — DESVENLAFAXINE SUCCINATE ER 100 MG PO TB24: 100.0000 mg | ORAL_TABLET | Freq: Every day | ORAL | 1 refills | Status: DC
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
## 2020-07-03 ENCOUNTER — Other Ambulatory Visit: Payer: Self-pay

## 2020-07-03 ENCOUNTER — Ambulatory Visit (INDEPENDENT_AMBULATORY_CARE_PROVIDER_SITE_OTHER): Payer: Federal, State, Local not specified - PPO | Admitting: Psychiatry

## 2020-07-03 DIAGNOSIS — F4310 Post-traumatic stress disorder, unspecified: Secondary | ICD-10-CM | POA: Diagnosis not present

## 2020-07-03 NOTE — Progress Notes (Signed)
[facility] Counsel[facility] Initial Adult Exam  Name: Sheila [name] Date: 07/04/2020 MRN: [id number] DOB: 09/22/1965 PCP: Ann Held, DO  Time spent: 60 minutes  9:00am to 10:00am  Guardian/Payee:  patient  Paperwork requested:  No   Reason for Visit /Presenting Problem:  Anxiety, some OCD, some panic,tearfulness, depression, "Anxiety is strongest symptom." Later stated PTSD is her main issue." PTSD related to dad's death as "he died in my arms."  Flashbacks happen daily.   Mental Status Exam:   Appearance:   Casual     Behavior:  Sharing, Motivated and anxious  Motor:  Normal  Speech/Language:   Clear and Coherent  Affect:  Depressed and anxious  Mood:  anxious and depressed  Thought process:  normal  Thought content:    some obsessiveness  Sensory/Perceptual disturbances:    WNL  Orientation:  oriented to person, place, time/date, situation, day of week, month of year and year  Attention:  Good  Concentration:  Good  Memory:  WNL  Fund of knowledge:   Good  Insight:    Good and Fair  Judgment:   Good  Impulse Control:  Good   Reported Symptoms:  See symptoms above  Risk Assessment: Danger to Self:  No Self-injurious Behavior: No Danger to Others: No Duty to Warn:no Physical Aggression / Violence:No  Access to Firearms a concern: No  Gang Involvement:No  Patient / guardian was educated about steps to take if suicide or homicide risk level increases between visits: Patient denies any SI. While future psychiatric events cannot be accurately predicted, the patient does not currently require acute inpatient psychiatric care and does not currently meet [facility] involuntary commitment criteria.  Substance Abuse History: Current substance abuse: No     Past Psychiatric History:   Previous psychological history is significant for anxiety Outpatient Providers:Jessica Eulas Post (med provider), 2 visits with another therapist previously History of Psych  Hospitalization: No  Psychological Testing: n/a   Abuse History: Victim of No., no   Report needed: No. Victim of Neglect:No. Perpetrator of no  Witness / Exposure to Domestic Violence: No   Protective Services Involvement: No  Witness to [employer] Violence:  No   Family History:  Reviewed with patient and she confirms info below is correct. Family History  Problem Relation Age of Onset  . Breast cancer Mother   . Lung cancer Father        Died in 11-Dec-2011  . Heart disease Father   . ADD / ADHD Son   . Breast cancer Maternal Grandmother   . Ovarian cancer Maternal Grandmother   . Breast cancer Maternal Aunt   . Prostate cancer Maternal Grandfather   . Colon cancer Maternal Grandfather   . Irritable bowel syndrome Sister   . Ovarian cancer Maternal Aunt   . Colon polyps Neg Hx   . Esophageal cancer Neg Hx   . Kidney disease Neg Hx   . Gallbladder disease Neg Hx   . Rectal cancer Neg Hx   . Stomach cancer Neg Hx     Living situation: the patient lives with their family (husband of 94 yrs, 2 sons ages 62 and 68, both in high school. Mother living but no relationship with her.  Dad died 8 yrs ago in arms of patient.  Sexual Orientation:  Straight  Relationship Status: married  Name of spouse / other: n/a             If a parent, number of children / ages:  2 sons, ages 30 and 34.  Support Systems; spouse friends  Museum/gallery curator Stress:  No   Income/Employment/Disability: Employment  Armed forces logistics/support/administrative officer: No   Educational History: Education: post Forensic psychologist work or degree  Religion/Sprituality/World View:   Catholic  Any cultural differences that may affect / interfere with treatment:  not applicable   Recreation/Hobbies: "my online business"  Stressors:Health problems Loss of father 8 yrs ago (ptsd related)  Strengths:  Family, Friends, Spirituality, Hopefulness and Able to Communicate Effectively  Barriers:    Legal History: Pending legal issue / charges: The  patient has no significant history of legal issues. History of legal issue / charges: n/a  Medical History/Surgical History :Reviewed and patient confirms info below. Past Medical History:  Diagnosis Date  . Anal fissure   . Anxiety   . Asthma    uses daily  . Chronic interstitial cystitis   . Crohn disease ([place])   . Fibromyalgia   . Migraines   . Pneumonia 2007  . SLE (systemic lupus erythematosus) ([place])     Past Surgical History:  Procedure Laterality Date  . BLADDER SURGERY    . CESAREAN SECTION    . CYSTOSCOPY    . KNEE ARTHROSCOPY    . LAPAROSCOPIC VAGINAL HYSTERECTOMY WITH SALPINGO OOPHORECTOMY Bilateral 01/24/2020   Procedure: LAPAROSCOPIC ASSISTED VAGINAL HYSTERECTOMY WITH BILATERAL OOPHORECTOMY AND LEFT  SALPINGECTOMY;  Surgeon: Arvella Nigh, MD;  Location: [facility];  Service: Gynecology;  Laterality: Bilateral;  need bed  . SALPINGECTOMY Right    right  . TONSILLECTOMY    . WRIST SURGERY      Medications: Current Outpatient Medications  Medication Sig Dispense Refill  . ALPRAZolam (XANAX) 0.5 MG tablet TAKE 1 TABLET(0.5 MG) BY MOUTH THREE TIMES DAILY AS NEEDED (Patient taking differently: Take 0.5 mg by mouth 3 (three) times daily as needed for anxiety. ) 90 tablet 0  . azithromycin (ZITHROMAX) 250 MG tablet Take 2 tablets by mouth on day 1, followed by 1 tablet by mouth daily for 4 days. (Patient not taking: Reported on 01/05/2020) 6 tablet 0  . CRYSELLE-28 0.3-30 MG-MCG tablet Take 1 tablet by mouth daily. (Patient not taking: Reported on 06/27/2020)    . desvenlafaxine (PRISTIQ) 100 MG 24 hr tablet Take 1 tablet (100 mg total) by mouth daily. 30 tablet 1  . tiZANidine (ZANAFLEX) 4 MG tablet TAKE 1 TABLET BY MOUTH TWICE DAILY AS NEEDED 60 tablet 1  . topiramate (TOPAMAX) 100 MG tablet TAKE 1 TABLET BY MOUTH EVERY DAY 30 tablet 0  . traZODone (DESYREL) 50 MG tablet trazodone 50 mg tablet  TAKE 1 TO 2 TABLETS BY MOUTH EVERY NIGHT AT BEDTIME    .  triamcinolone cream (KENALOG) 0.1 % Apply 1 application topically 2 (two) times daily. (Patient not taking: Reported on 01/05/2020) 45 g 3   No current facility-administered medications for this visit.    Allergies  Allergen Reactions  . Ampicillin   . Cymbalta [Duloxetine Hcl] Nausea Only    Constipation, bloating  . Doxycycline Diarrhea  . Fentanyl Citrate Nausea And Vomiting    fever  . Penicillins     Diagnoses:    ICD-10-CM   1. Post traumatic stress disorder  F43.10     Plan of Care:  This is first session for this 54 yr old female patient, married, mother of 2 teenage sons. Reporting PTSD and flashbacks daily.  States her triggering event is father dying in her arms 8 yrs ago. States her PTSD  is worsening and anxiety gets worse at night. Waking up anxious at night. Very reactive to to any situation that reminds her of experience 8 yrs ago and that occurs often. Had several intrusive thoughts during session and she'd stop and seem to "re-group" and then begin talking again.  While she was very willing to talk, she also seemed to be deep in other thoughts, but related to the conversation about her history since dad's death and the traumatic impact of him dying in her arms. Due to the extent of her PTSD and her emphasis on saying she needed help for the frequent flashbacks "daily", I explained to patient that she really needed to be seen by a therapist who does trauma-focused therapy such as EMDR. We discussed this in more length and patient agreed. Our office will get patient scheduled with a therapist who is experienced in EMDR as soon as possible and patient remains agreeable with that plan. She denies any SI/HI and seems to be motivated to get better.  States it helped to talk out a lot of her concerns today but also understands the need to focus on her trauma symptomology that she reports is quite disturbing for her.  Treatment goals to be determined at next session.   Shanon Ace,  LCSW

## 2020-07-04 ENCOUNTER — Telehealth: Payer: Self-pay | Admitting: Psychiatry

## 2020-07-04 NOTE — Telephone Encounter (Signed)
OK to decrease Pristiq to 100 mg daily.  These are common SE

## 2020-07-04 NOTE — Telephone Encounter (Signed)
Please review

## 2020-07-04 NOTE — Telephone Encounter (Signed)
Pt called and left a message that she started the increase of the prestiq from 50 mg to 100 mg.. Since the increase she is having side effects of headache and stomach hurts. She is also feels very anxious. Please call her and let her know can she go back down to the 50 mg at [phone]

## 2020-07-06 NOTE — Telephone Encounter (Signed)
Left patient message okay to decrease back down to 50 mg Pristiq, not 100 mg. Advised her to call back with further questions.

## 2020-07-30 ENCOUNTER — Other Ambulatory Visit: Payer: Self-pay | Admitting: Family Medicine

## 2020-07-30 DIAGNOSIS — G43009 Migraine without aura, not intractable, without status migrainosus: Secondary | ICD-10-CM

## 2020-08-03 ENCOUNTER — Ambulatory Visit (INDEPENDENT_AMBULATORY_CARE_PROVIDER_SITE_OTHER): Payer: Federal, State, Local not specified - PPO | Admitting: Psychiatry

## 2020-08-03 ENCOUNTER — Other Ambulatory Visit: Payer: Self-pay

## 2020-08-03 DIAGNOSIS — F410 Panic disorder [episodic paroxysmal anxiety] without agoraphobia: Secondary | ICD-10-CM | POA: Diagnosis not present

## 2020-08-03 DIAGNOSIS — F4310 Post-traumatic stress disorder, unspecified: Secondary | ICD-10-CM | POA: Diagnosis not present

## 2020-08-03 MED ORDER — BUSPIRONE HCL 15 MG PO TABS: ORAL_TABLET | ORAL | 1 refills | Status: DC

## 2020-08-03 MED ORDER — DESVENLAFAXINE SUCCINATE ER 50 MG PO TB24: 50.0000 mg | ORAL_TABLET | Freq: Every day | ORAL | 0 refills | Status: DC
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
## 2020-08-04 ENCOUNTER — Encounter: Payer: Self-pay | Admitting: Psychiatry

## 2020-08-15 ENCOUNTER — Other Ambulatory Visit: Payer: Self-pay

## 2020-08-15 ENCOUNTER — Ambulatory Visit (INDEPENDENT_AMBULATORY_CARE_PROVIDER_SITE_OTHER): Payer: Federal, State, Local not specified - PPO | Admitting: Psychiatry

## 2020-08-15 DIAGNOSIS — F4310 Post-traumatic stress disorder, unspecified: Secondary | ICD-10-CM | POA: Diagnosis not present
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
## 2020-08-23 ENCOUNTER — Other Ambulatory Visit: Payer: Self-pay

## 2020-08-23 ENCOUNTER — Other Ambulatory Visit (INDEPENDENT_AMBULATORY_CARE_PROVIDER_SITE_OTHER): Payer: Federal, State, Local not specified - PPO

## 2020-08-23 DIAGNOSIS — E785 Hyperlipidemia, unspecified: Secondary | ICD-10-CM

## 2020-08-23 LAB — COMPREHENSIVE METABOLIC PANEL
ALT: 9 U/L (ref 0–35)
AST: 16 U/L (ref 0–37)
Albumin: 4.5 g/dL (ref 3.5–5.2)
Alkaline Phosphatase: 122 U/L — ABNORMAL HIGH (ref 39–117)
BUN: 15 mg/dL (ref 6–23)
CO2: 29 mEq/L (ref 19–32)
Calcium: 9.9 mg/dL (ref 8.4–10.5)
Chloride: 105 mEq/L (ref 96–112)
Creatinine, Ser: 1 mg/dL (ref 0.40–1.20)
GFR: 64.82 mL/min (ref >60.00)
Glucose, Bld: 90 mg/dL (ref 70–99)
Potassium: 4 mEq/L (ref 3.5–5.1)
Sodium: 142 mEq/L (ref 135–145)
Total Bilirubin: 0.5 mg/dL (ref 0.2–1.2)
Total Protein: 7.6 g/dL (ref 6.0–8.3)

## 2020-08-23 LAB — LIPID PANEL
Cholesterol: 249 mg/dL — ABNORMAL HIGH (ref 0–200)
HDL: 65.6 mg/dL (ref >39.00)
LDL Cholesterol: 158 mg/dL — ABNORMAL HIGH (ref 0–99)
NonHDL: 183.55
Total CHOL/HDL Ratio: 4
Triglycerides: 126 mg/dL (ref 0.0–149.0)
VLDL: 25.2 mg/dL (ref 0.0–40.0)

## 2020-08-24 DIAGNOSIS — H16143 Punctate keratitis, bilateral: Secondary | ICD-10-CM | POA: Diagnosis not present

## 2020-08-24 DIAGNOSIS — H16223 Keratoconjunctivitis sicca, not specified as Sjogren's, bilateral: Secondary | ICD-10-CM | POA: Diagnosis not present

## 2020-08-24 DIAGNOSIS — H52223 Regular astigmatism, bilateral: Secondary | ICD-10-CM | POA: Diagnosis not present

## 2020-08-24 DIAGNOSIS — M329 Systemic lupus erythematosus, unspecified: Secondary | ICD-10-CM | POA: Diagnosis not present

## 2020-08-27 ENCOUNTER — Other Ambulatory Visit: Payer: Self-pay | Admitting: Family Medicine

## 2020-08-27 DIAGNOSIS — G43009 Migraine without aura, not intractable, without status migrainosus: Secondary | ICD-10-CM

## 2020-08-29 ENCOUNTER — Other Ambulatory Visit: Payer: Self-pay

## 2020-08-29 ENCOUNTER — Ambulatory Visit (INDEPENDENT_AMBULATORY_CARE_PROVIDER_SITE_OTHER): Payer: Federal, State, Local not specified - PPO | Admitting: Psychiatry

## 2020-08-29 DIAGNOSIS — F4310 Post-traumatic stress disorder, unspecified: Secondary | ICD-10-CM

## 2020-08-29 NOTE — Progress Notes (Signed)
      [facility] Counselor/Therapist Progress Note  Patient ID: Sheila [name], MRN: [id number],    Date: 08/29/2020  Time Spent: 52 minutes start time 8:05 AM end time 8:57 AM  Treatment Type: Individual Therapy  Reported Symptoms: anxiety, sadness, triggered responses, panic, sleep issues  Mental Status Exam:  Appearance:   Well Groomed     Behavior:  Appropriate  Motor:  Normal  Speech/Language:   Normal Rate  Affect:  Appropriate  Mood:  anxious and sad  Thought process:  normal  Thought content:    WNL  Sensory/Perceptual disturbances:    WNL  Orientation:  oriented to person, place, time/date and situation  Attention:  Good  Concentration:  Good  Memory:  WNL  Fund of knowledge:   Good  Insight:    Good  Judgment:   Good  Impulse Control:  Good   Risk Assessment: Danger to Self:  No Self-injurious Behavior: No Danger to Others: No Duty to Warn:no Physical Aggression / Violence:No  Access to Firearms a concern: No  Gang Involvement:No   Subjective: Patient was present for session.  She shared she is practicing coping skills practiced at last session and she is found that the safe place exercise works well for her.  She shared that the hardest thing is when she wakes up in panic in the middle of the night.  Encourage patient to try and write out what ever pops in her head for 2 or 3 minutes right before going to bed and to keep a pad by her bed so if she wakes up she can write down what had woken her up.  Discussed the fact that her brain is probably working on some things while she is sleeping and those things need to be processed through EMDR.  Had patient do an EMDR exercise to create a place to store all of her memories concerning her father as well as develop a visual for her to release the negative emotions appropriately.  Patient responded well to the exercise and ways to utilize it over the holidays were discussed.  Encourage patient to find ways to honor her  father and other people that she has lost through the holiday season.  Interventions: Solution-Oriented/Positive Psychology and Eye Movement Desensitization and Reprocessing (EMDR)  Diagnosis:   ICD-10-CM   1. Post traumatic stress disorder  F43.10     Plan: Patient is to use coping skills to help manage triggered responses.  Patient is to practice visualizations from session.  Patient is to write prior to going to sleep for 2 to 3 minutes as fast as she can.  Patient is to find ways to honor her father through the holidays. Long-term goal: Recall the traumatic event without becoming overwhelming negative emotions Short-term goal: Practice implement relaxation training as a coping mechanism for tension panic stress anger and anxiety  Lina Sayre, [facility]

## 2020-09-20 ENCOUNTER — Other Ambulatory Visit: Payer: Self-pay

## 2020-09-20 ENCOUNTER — Encounter: Payer: Self-pay | Admitting: Family Medicine

## 2020-09-20 ENCOUNTER — Telehealth (INDEPENDENT_AMBULATORY_CARE_PROVIDER_SITE_OTHER): Payer: Federal, State, Local not specified - PPO | Admitting: Family Medicine

## 2020-09-20 DIAGNOSIS — J40 Bronchitis, not specified as acute or chronic: Secondary | ICD-10-CM | POA: Diagnosis not present

## 2020-09-20 MED ORDER — AZITHROMYCIN 250 MG PO TABS: ORAL_TABLET | ORAL | 0 refills | Status: DC

## 2020-09-20 MED ORDER — PROMETHAZINE-DM 6.25-15 MG/5ML PO SYRP: 5.0000 mL | ORAL_SOLUTION | Freq: Four times a day (QID) | ORAL | 0 refills | Status: DC | PRN

## 2020-09-20 NOTE — Progress Notes (Signed)
Virtual Visit via Video Note  I connected with Sheila [name] on 09/20/20 at  9:00 AM EST by a video enabled telemedicine application and verified that I am speaking with the correct person using two identifiers.  Location: Patient: home  Provider: home    I discussed the limitations of evaluation and management by telemedicine and the availability of in person appointments. The patient expressed understanding and agreed to proceed.  History of Present Illness: Pt is home and feels like she has pneumonia/ bronchitis -- symptoms x 5 days    +fever , bodyaches, sore throat  Pt is fully vaccinated   Pt has not had a covid test  Cough is dry    Past Medical History:  Diagnosis Date  . Anal fissure   . Anxiety   . Asthma    uses daily  . Chronic interstitial cystitis   . Crohn disease ([place])   . Fibromyalgia   . Migraines   . Pneumonia 2007  . SLE (systemic lupus erythematosus) ([place])    Outpatient Encounter Medications as of 09/20/2020  Medication Sig  . azithromycin (ZITHROMAX Z-PAK) 250 MG tablet As directed  . busPIRone (BUSPAR) 15 MG tablet Take 1/3 tablet p.o. twice daily for 1 week, then take 2/3 tablet p.o. twice daily for 1 week, then take 1 tablet p.o. twice daily  . desvenlafaxine (PRISTIQ) 50 MG 24 hr tablet Take 1 tablet (50 mg total) by mouth daily.  . promethazine-dextromethorphan (PROMETHAZINE-DM) 6.25-15 MG/5ML syrup Take 5 mLs by mouth 4 (four) times daily as needed.  Marland Kitchen tiZANidine (ZANAFLEX) 4 MG tablet TAKE 1 TABLET BY MOUTH TWICE DAILY AS NEEDED  . topiramate (TOPAMAX) 100 MG tablet TAKE 1 TABLET(100 MG) BY MOUTH DAILY  . traZODone (DESYREL) 50 MG tablet trazodone 50 mg tablet  TAKE 1 TO 2 TABLETS BY MOUTH EVERY NIGHT AT BEDTIME  . ALPRAZolam (XANAX) 0.5 MG tablet TAKE 1 TABLET(0.5 MG) BY MOUTH THREE TIMES DAILY AS NEEDED (Patient taking differently: Take 0.5 mg by mouth 3 (three) times daily as needed for anxiety. )  . azithromycin (ZITHROMAX) 250 MG tablet Take 2  tablets by mouth on day 1, followed by 1 tablet by mouth daily for 4 days. (Patient not taking: Reported on 01/05/2020)  . CRYSELLE-28 0.3-30 MG-MCG tablet Take 1 tablet by mouth daily. (Patient not taking: Reported on 06/27/2020)  . triamcinolone cream (KENALOG) 0.1 % Apply 1 application topically 2 (two) times daily. (Patient not taking: Reported on 01/05/2020)   No facility-administered encounter medications on file as of 09/20/2020.   Observations/Objective: There were no vitals filed for this visit. Fever 101--- did not check today Pt is in NAD No sob   Assessment and Plan:  1. Bronchitis Pt will get a covid test abx and cough med per orders Drink plenty of fluids  Rest  Call if symptoms do not improve  - azithromycin (ZITHROMAX Z-PAK) 250 MG tablet; As directed  Dispense: 6 each; Refill: 0 - promethazine-dextromethorphan (PROMETHAZINE-DM) 6.25-15 MG/5ML syrup; Take 5 mLs by mouth 4 (four) times daily as needed.  Dispense: 118 mL; Refill: 0  Follow Up Instructions:    I discussed the assessment and treatment plan with the patient. The patient was provided an opportunity to ask questions and all were answered. The patient agreed with the plan and demonstrated an understanding of the instructions.   The patient was advised to call back or seek an in-person evaluation if the symptoms worsen or if the condition fails to improve  as anticipated.  I provided 25 minutes of non-face-to-face time during this encounter.   Ann Held, DO

## 2020-09-21 ENCOUNTER — Ambulatory Visit: Payer: Federal, State, Local not specified - PPO | Admitting: Psychiatry

## 2020-09-28 ENCOUNTER — Other Ambulatory Visit: Payer: Self-pay

## 2020-09-28 ENCOUNTER — Ambulatory Visit (INDEPENDENT_AMBULATORY_CARE_PROVIDER_SITE_OTHER): Payer: Federal, State, Local not specified - PPO | Admitting: Psychiatry

## 2020-09-28 DIAGNOSIS — F4310 Post-traumatic stress disorder, unspecified: Secondary | ICD-10-CM | POA: Diagnosis not present

## 2020-09-28 NOTE — Progress Notes (Signed)
      [facility] Counselor/Therapist Progress Note  Patient ID: Sheila [name], MRN: [id number],    Date: 09/28/2020  Time Spent: 51 minutes start time 12:06 AM end time 12:57 AM  Treatment Type: Individual Therapy  Reported Symptoms: anxiety, sadness, triggered responses, grief issues, crying spells  Mental Status Exam:  Appearance:   Well Groomed     Behavior:  Appropriate  Motor:  Normal  Speech/Language:   Normal Rate  Affect:  Appropriate  Mood:  anxious  Thought process:  normal  Thought content:    WNL  Sensory/Perceptual disturbances:    WNL  Orientation:  oriented to person, place, time/date and situation  Attention:  Good  Concentration:  Good  Memory:  WNL  Fund of knowledge:   Good  Insight:    Good  Judgment:   Good  Impulse Control:  Good   Risk Assessment: Danger to Self:  No Self-injurious Behavior: No Danger to Others: No Duty to Warn:no Physical Aggression / Violence:No  Access to Firearms a concern: No  Gang Involvement:No   Subjective: Patient was present for session.  Patient reported it had been a hard 2 weeks due to all the triggers that surround the holidays and getting the flu and bronchitis.  Patient shared that she stopped taking her newest medication and had to cancel her appointment with her provider but was planning on talking to her about it and currently she was not interested in trying anything different.  Patient went on to share that she has been practicing the exercises from last session and found them to be helpful.  She shared she has thought a lot about keeping the memories of her dad present but not the trauma.  Patient shared she had some different situations that helped her feel more of a connection to her father in a positive manner which helped her to embrace more of that.  She reported feeling that she would be ready to start trauma work at next session.  Patient shares she has not started journaling.  Discussed different  techniques to use when journaling including writing letters to her loved ones that she has lost or writing out positive memories or favorite places they used to go.  Patient reported feeling good about those options and agreed to work on it over the next week  Interventions: Solution-Oriented/Positive Psychology  Diagnosis:   ICD-10-CM   1. Post traumatic stress disorder  F43.10     Plan: Patient is to use CBT and coping skills to decrease triggered responses.  Patient is going to journal different memories and positive places as well as write out letters to the people that she has lost.  Patient will continue focusing on her self-care including talking to her provider Thayer Headings, PMH, NP concerning medication. Long-term goal: Recall the traumatic event without becoming overwhelming negative emotions Short-term goal: Practice implement relaxation training as a coping mechanism for tension panic stress anger and anxiety  Lina Sayre, [facility]

## 2020-10-04 ENCOUNTER — Other Ambulatory Visit: Payer: Self-pay | Admitting: Family Medicine

## 2020-10-04 DIAGNOSIS — G43009 Migraine without aura, not intractable, without status migrainosus: Secondary | ICD-10-CM

## 2020-10-05 ENCOUNTER — Other Ambulatory Visit: Payer: Self-pay

## 2020-10-05 ENCOUNTER — Ambulatory Visit: Payer: Federal, State, Local not specified - PPO | Admitting: Psychiatry

## 2020-10-12 ENCOUNTER — Ambulatory Visit (INDEPENDENT_AMBULATORY_CARE_PROVIDER_SITE_OTHER): Payer: Federal, State, Local not specified - PPO | Admitting: Psychiatry

## 2020-10-12 ENCOUNTER — Other Ambulatory Visit: Payer: Self-pay

## 2020-10-12 DIAGNOSIS — F4310 Post-traumatic stress disorder, unspecified: Secondary | ICD-10-CM | POA: Diagnosis not present
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
## 2020-10-19 ENCOUNTER — Other Ambulatory Visit: Payer: Self-pay

## 2020-10-19 ENCOUNTER — Ambulatory Visit (INDEPENDENT_AMBULATORY_CARE_PROVIDER_SITE_OTHER): Payer: Federal, State, Local not specified - PPO | Admitting: Psychiatry

## 2020-10-19 DIAGNOSIS — F4310 Post-traumatic stress disorder, unspecified: Secondary | ICD-10-CM | POA: Diagnosis not present
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
## 2020-11-02 ENCOUNTER — Ambulatory Visit (INDEPENDENT_AMBULATORY_CARE_PROVIDER_SITE_OTHER): Payer: Federal, State, Local not specified - PPO | Admitting: Psychiatry

## 2020-11-02 ENCOUNTER — Telehealth: Payer: Self-pay | Admitting: Psychiatry

## 2020-11-02 ENCOUNTER — Other Ambulatory Visit: Payer: Self-pay

## 2020-11-02 DIAGNOSIS — F4310 Post-traumatic stress disorder, unspecified: Secondary | ICD-10-CM

## 2020-11-02 DIAGNOSIS — F410 Panic disorder [episodic paroxysmal anxiety] without agoraphobia: Secondary | ICD-10-CM

## 2020-11-02 MED ORDER — DESVENLAFAXINE SUCCINATE ER 50 MG PO TB24: 50.0000 mg | ORAL_TABLET | Freq: Every day | ORAL | 0 refills | Status: DC

## 2020-11-02 NOTE — Telephone Encounter (Signed)
Script sent  

## 2020-11-02 NOTE — Telephone Encounter (Signed)
Pt was here for a therapy appt. And she needs a refill on her prestiq to be sent to the [facility] on [address] in [place]. She has an appt in march
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
## 2020-11-07 ENCOUNTER — Ambulatory Visit (INDEPENDENT_AMBULATORY_CARE_PROVIDER_SITE_OTHER): Payer: Federal, State, Local not specified - PPO | Admitting: Psychiatry

## 2020-11-07 ENCOUNTER — Other Ambulatory Visit: Payer: Self-pay

## 2020-11-07 DIAGNOSIS — F4310 Post-traumatic stress disorder, unspecified: Secondary | ICD-10-CM | POA: Diagnosis not present
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
## 2020-11-15 ENCOUNTER — Encounter: Payer: Self-pay | Admitting: Psychiatry

## 2020-11-15 ENCOUNTER — Ambulatory Visit (INDEPENDENT_AMBULATORY_CARE_PROVIDER_SITE_OTHER): Payer: Federal, State, Local not specified - PPO | Admitting: Psychiatry

## 2020-11-15 ENCOUNTER — Other Ambulatory Visit: Payer: Self-pay

## 2020-11-15 DIAGNOSIS — F419 Anxiety disorder, unspecified: Secondary | ICD-10-CM

## 2020-11-15 DIAGNOSIS — F4310 Post-traumatic stress disorder, unspecified: Secondary | ICD-10-CM

## 2020-11-15 DIAGNOSIS — F410 Panic disorder [episodic paroxysmal anxiety] without agoraphobia: Secondary | ICD-10-CM | POA: Diagnosis not present

## 2020-11-15 DIAGNOSIS — F418 Other specified anxiety disorders: Secondary | ICD-10-CM

## 2020-11-15 MED ORDER — DESVENLAFAXINE SUCCINATE ER 50 MG PO TB24: 50.0000 mg | ORAL_TABLET | Freq: Every day | ORAL | 0 refills | Status: DC

## 2020-11-15 NOTE — Progress Notes (Signed)
      [facility] Counselor/Therapist Progress Note  Patient ID: Sheila [name], MRN: [id number],    Date: 11/15/2020  Time Spent: 52 minutes start time 11:03 AM end time 11:55 AM  Treatment Type: Individual Therapy  Reported Symptoms: sadness, anxiety, triggered responses, health issues, panic attacks, nightmares, focusing issues, isolation  Mental Status Exam:  Appearance:   Well Groomed     Behavior:  Appropriate  Motor:  Normal  Speech/Language:   Normal Rate  Affect:  Appropriate  Mood:  anxious  Thought process:  normal  Thought content:    WNL  Sensory/Perceptual disturbances:    WNL  Orientation:  oriented to person, place, time/date and situation  Attention:  Good  Concentration:  Good  Memory:  WNL  Fund of knowledge:   Good  Insight:    Good  Judgment:   Good  Impulse Control:  Good   Risk Assessment: Danger to Self:  No Self-injurious Behavior: No Danger to Others: No Duty to Warn:no Physical Aggression / Violence:No  Access to Firearms a concern: No  Gang Involvement:No   Subjective: Patient was present for session.  Patient reported that she had use the visual from last session often and that had been very helpful for her.  Patient explained that her husband and sons had gotten home from their trip and her husband's incision is swollen but she is allowing him to handle things the way he wants to.  She shared she is still having a great deal of difficulty functioning and is having lots of guilt about not being who she wants to be for her kids during this very difficult time.  Patient was taught thought/feelings versus facts/truth CBT skill.  Patient was encouraged to write down all of the facts about the situation as well as her thoughts and feelings.  Patient practiced the exercise in session and was able to realize that she has not damaged her children and that they are doing well and that it is okay for them to see her work through her struggles emotionally and  physically.  Patient was also encouraged to think through the stressors that are creating the difficulty in her functioning and develop plans to manage them.  Patient stated that the biggest issue is not being able to decide what to do further spring break vacation.  Allowed her to process different ideas and what she was really wanting to do.  Patient was able to develop a plan that was written out for her so that she can follow-up when she gets time.  Patient reported feeling better at the end of session.  Interventions: Cognitive Behavioral Therapy and Solution-Oriented/Positive Psychology  Diagnosis:   ICD-10-CM   1. Post traumatic stress disorder  F43.10     Plan: Patient is to use CBT and coping skills to decrease triggered responses.  Patient is to practice thoughts/feelings versus facts/truth CBT exercise when her thoughts start to cycle negatively.  Patient is to follow plans from session to set up vacation plans.  Patient is to work on practicing visuals from previous sessions to stay grounded.  Long-term goal: Recall the traumatic event without becoming overwhelming negative emotions Short-term goal: Practice implement relaxation training as a coping mechanism for tension panic stress anger and anxiety   Lina Sayre, [facility]

## 2020-11-15 NOTE — Progress Notes (Signed)
Sheila [name] [id number] 11-Aug-1966 55 y.o.  Subjective:   Patient ID:  Sheila [name] is a 55 y.o. (DOB [birth year]) female.  Chief Complaint:  Chief Complaint  Patient presents with  . Anxiety  . Depression  . Insomnia    HPI Sheila [name] presents to the office today for follow-up of anxiety, depression, and insomnia. She reports that she will "cry at the drop of a hat." She reports intrusive memories and is able to try to work through this. She reports that she is having "full blown anxiety attacks." She reports that there are times she would rarely use Xanax prn and now has discovered that if she takes Xanax at the first sign of panic and this has helped contain panic. Her husband has told her that she is having nightmares and she does not remember nightmares. She reports that she is taking Trazodone two tablets at bedtime. Reports sleeping about 6 hours a night. She reports feeling "emotionally exhausted" some days and has difficulty making decisions and is unable to focus. She reports 'feeling like a complete failure as a parent." She reports sad mood. Denies severe irritability- "I think I am more sad." Reports regret about not getting help sooner and the effects that this could have had on her family. She reports that she has some passive death wishes. Denies SI.   She reports poor concentration. She reports that there are days she does not want to leave the house and can go a week without leaving home. She reports social anxiety and general anxiety. Motivation fluctuates. Appetite has been "about the same."   She reports that family has been supportive. Oldest son has ADHD, inattentive type. Has reached out to some close friends that have been through therapy and they have been supportive.   Past Psychiatric Medication Trials: Pristiq- Has seemed to help "take the edge off." Unable to tolerate 75 mg due to n/v Savella Cymbalta- nausea Effexor  XR Lexapro Sertraline Prozac Wellbutrin XL Xanax- Now needing to take 0.5 mg TID Topamax- takes for migraine prevention Gabapentin Trazodone- Helpful for sleep initiation.   [place] Office Visit from 05/18/2020 in [facility] Visit from 08/27/2016 in [place] [facility] [place]  PHQ-2 Total Score 2 0  PHQ-9 Total Score 15 --       Review of Systems:  Review of Systems  Musculoskeletal: Negative for gait problem.  Neurological: Negative for headaches.  Psychiatric/Behavioral:       Please refer to HPI    Medications: I have reviewed the patient's current medications.  Current Outpatient Medications  Medication Sig Dispense Refill  . ALPRAZolam (XANAX) 0.5 MG tablet TAKE 1 TABLET(0.5 MG) BY MOUTH THREE TIMES DAILY AS NEEDED (Patient taking differently: Take 0.5 mg by mouth 3 (three) times daily as needed for anxiety.) 90 tablet 0  . topiramate (TOPAMAX) 100 MG tablet TAKE 1 TABLET(100 MG) BY MOUTH DAILY 30 tablet 2  . traZODone (DESYREL) 50 MG tablet 100 mg.    . azithromycin (ZITHROMAX Z-PAK) 250 MG tablet As directed 6 each 0  . azithromycin (ZITHROMAX) 250 MG tablet Take 2 tablets by mouth on day 1, followed by 1 tablet by mouth daily for 4 days. (Patient not taking: Reported on 01/05/2020) 6 tablet 0  . CRYSELLE-28 0.3-30 MG-MCG tablet Take 1 tablet by mouth daily. (Patient not taking: No sig reported)    . desvenlafaxine (PRISTIQ) 50 MG  24 hr tablet Take 1 tablet (50 mg total) by mouth daily. 90 tablet 0  . promethazine-dextromethorphan (PROMETHAZINE-DM) 6.25-15 MG/5ML syrup Take 5 mLs by mouth 4 (four) times daily as needed. (Patient not taking: Reported on 11/15/2020) 118 mL 0  . tiZANidine (ZANAFLEX) 4 MG tablet TAKE 1 TABLET BY MOUTH TWICE DAILY AS NEEDED 60 tablet 1  . triamcinolone cream (KENALOG) 0.1 % Apply 1 application topically 2 (two) times daily. (Patient not taking: Reported  on 01/05/2020) 45 g 3   No current facility-administered medications for this visit.    Medication Side Effects: None  Allergies:  Allergies  Allergen Reactions  . Ampicillin   . Cymbalta [Duloxetine Hcl] Nausea Only    Constipation, bloating  . Doxycycline Diarrhea  . Fentanyl Citrate Nausea And Vomiting    fever  . Penicillins     Past Medical History:  Diagnosis Date  . Anal fissure   . Anxiety   . Asthma    uses daily  . Chronic interstitial cystitis   . Crohn disease ([place])   . Fibromyalgia   . Migraines   . Pneumonia 09-Dec-2005  . SLE (systemic lupus erythematosus) (HCC)     Family History  Problem Relation Age of Onset  . Breast cancer Mother   . Lung cancer Father        Died in 12-10-2011  . Heart disease Father   . ADD / ADHD Son   . Breast cancer Maternal Grandmother   . Ovarian cancer Maternal Grandmother   . Breast cancer Maternal Aunt   . Prostate cancer Maternal Grandfather   . Colon cancer Maternal Grandfather   . Irritable bowel syndrome Sister   . Ovarian cancer Maternal Aunt   . Colon polyps Neg Hx   . Esophageal cancer Neg Hx   . Kidney disease Neg Hx   . Gallbladder disease Neg Hx   . Rectal cancer Neg Hx   . Stomach cancer Neg Hx     Social History   Socioeconomic History  . Marital status: Married    Spouse name: Not on file  . Number of children: 2  . Years of education: Not on file  . Highest education level: Not on file  Occupational History  . Occupation: Print production planner  Tobacco Use  . Smoking status: Never Smoker  . Smokeless tobacco: Never Used  Vaping Use  . Vaping Use: Never used  Substance and Sexual Activity  . Alcohol use: No    Alcohol/week: 0.0 standard drinks  . Drug use: No  . Sexual activity: Yes  Other Topics Concern  . Not on file  Social History Narrative  . Not on file   Social Determinants of Health   Financial Resource Strain: Not on file  Food Insecurity: Not on file  Transportation Needs: Not on  file  Physical Activity: Not on file  Stress: Not on file  Social Connections: Not on file  Intimate Partner Violence: Not on file    Past Medical History, Surgical history, Social history, and Family history were reviewed and updated as appropriate.   Please see review of systems for further details on the patient's review from today.   Objective:   Physical Exam:  There were no vitals taken for this visit.  Physical Exam Constitutional:      General: She is not in acute distress. Musculoskeletal:        General: No deformity.  Neurological:     Mental Status: She is alert and  oriented to person, place, and time.     Coordination: Coordination normal.  Psychiatric:        Attention and Perception: Attention and perception normal. She does not perceive auditory or visual hallucinations.        Mood and Affect: Mood is anxious and depressed. Affect is tearful. Affect is not labile, blunt, angry or inappropriate.        Speech: Speech normal.        Behavior: Behavior normal.        Thought Content: Thought content normal. Thought content is not paranoid or delusional. Thought content does not include homicidal or suicidal ideation. Thought content does not include homicidal or suicidal plan.        Cognition and Memory: Cognition and memory normal.        Judgment: Judgment normal.     Comments: Insight intact     Lab Review:     Component Value Date/Time   NA 142 08/23/2020 0806   K 4.0 08/23/2020 0806   CL 105 08/23/2020 0806   CO2 29 08/23/2020 0806   GLUCOSE 90 08/23/2020 0806   BUN 15 08/23/2020 0806   CREATININE 1.00 08/23/2020 0806   CREATININE 0.95 05/18/2020 0922   CALCIUM 9.9 08/23/2020 0806   PROT 7.6 08/23/2020 0806   ALBUMIN 4.5 08/23/2020 0806   AST 16 08/23/2020 0806   ALT 9 08/23/2020 0806   ALKPHOS 122 (H) 08/23/2020 0806   BILITOT 0.5 08/23/2020 0806   GFRNONAA >60 01/20/2020 1123   GFRAA >60 01/20/2020 1123       Component Value Date/Time    WBC 7.3 05/18/2020 0922   RBC 4.80 05/18/2020 0922   HGB 14.3 05/18/2020 0922   HCT 42.8 05/18/2020 0922   PLT 316 05/18/2020 0922   MCV 89.2 05/18/2020 0922   MCH 29.8 05/18/2020 0922   MCHC 33.4 05/18/2020 0922   RDW 12.9 05/18/2020 0922   LYMPHSABS 2,088 05/18/2020 0922   MONOABS 0.6 11/22/2019 1013   EOSABS 139 05/18/2020 0922   BASOSABS 58 05/18/2020 0922    No results found for: POCLITH, LITHIUM   No results found for: PHENYTOIN, PHENOBARB, VALPROATE, CBMZ   .res Assessment: Plan:   Will continue current plan of care since target signs and symptoms are well controlled without any tolerability issues. Discussed that she may need to take Xanax 0.5 mg two tablets during an acute panic attack. She reports that her medical provider, Dr. Alona Bene, is currently prescribing Xanax. This provider will contact Dr. Amalia Hailey to discuss this.  Recommend continuing therapy with Lina Sayre, [facility].  Pt to follow-up in 3 months or sooner if clinically indicated.  Patient advised to contact office with any questions, adverse effects, or acute worsening in signs and symptoms.  Sheila [name] was seen today for anxiety, depression and insomnia.  Diagnoses and all orders for this visit:  Depression with anxiety  Anxiety  Post traumatic stress disorder -     desvenlafaxine (PRISTIQ) 50 MG 24 hr tablet; Take 1 tablet (50 mg total) by mouth daily.  Severe anxiety with panic -     desvenlafaxine (PRISTIQ) 50 MG 24 hr tablet; Take 1 tablet (50 mg total) by mouth daily.     Please see After Visit Summary for patient specific instructions.  Future Appointments  Date Time Provider [place]  11/22/2020  1:00 PM Lina Sayre, [facility] CP-CP None  11/29/2020 10:00 AM Lina Sayre, [facility] CP-CP None  12/06/2020 12:00 PM Lina Sayre, [facility]  CP-CP None  12/15/2020  9:00 AM Lina Sayre, [facility] CP-CP None  12/20/2020 11:00 AM Lina Sayre, [facility] CP-CP None  12/27/2020 11:00 AM Lina Sayre,  [facility] CP-CP None  01/10/2021 11:00 AM Lina Sayre, [facility] CP-CP None    No orders of the defined types were placed in this encounter.   -------------------------------

## 2020-11-22 ENCOUNTER — Ambulatory Visit (INDEPENDENT_AMBULATORY_CARE_PROVIDER_SITE_OTHER): Payer: Federal, State, Local not specified - PPO | Admitting: Psychiatry

## 2020-11-22 ENCOUNTER — Other Ambulatory Visit: Payer: Self-pay

## 2020-11-22 DIAGNOSIS — F4310 Post-traumatic stress disorder, unspecified: Secondary | ICD-10-CM | POA: Diagnosis not present

## 2020-11-22 NOTE — Progress Notes (Signed)
      [facility] Counselor/Therapist Progress Note  Patient ID: Sheila [name], MRN: [id number],    Date: 11/22/2020  Time Spent: 52 minutes start time 1:06 PM end time 1:58 PM  Treatment Type: Individual Therapy  Reported Symptoms: anxiety, triggered responses, sadness  Mental Status Exam:  Appearance:   Well Groomed     Behavior:  Appropriate  Motor:  Normal  Speech/Language:   Normal Rate  Affect:  Appropriate tearful  Mood:  anxious  Thought process:  normal  Thought content:    WNL  Sensory/Perceptual disturbances:    WNL  Orientation:  oriented to person, place, time/date and situation  Attention:  Good  Concentration:  Good  Memory:  WNL  Fund of knowledge:   Good  Insight:    Good  Judgment:   Good  Impulse Control:  Good   Risk Assessment: Danger to Self:  No Self-injurious Behavior: No Danger to Others: No Duty to Warn:no Physical Aggression / Violence:No  Access to Firearms a concern: No  Gang Involvement:No   Subjective: Patient was present for session. She shared she was able to make a decision about going to the beach and she was able to get it booked.  She stated that she used her facts/truths strategy and found that helpful.  Patient reported she tried to communicate some of what was going on in treatment with her husband and he struggled to understand it, agreed that if he wanted to attend part of the session to help understand EMDR that would be fine.  Patient stated she is still struggling with the picture of all the people doing CPR.  Suds level 10, negative cognition "I did something wrong" felt sadness in her body.  Patient was able to reduce as level to 3.  She was able to develop a visual of her dad being out of the house and leaving that picture.  She found a part that they used to go to [place] where his ashes are to visit.  Patient was given a rock that she found reminded her of that place to keep and rub when she wants to feel that connection with  her father.  Interventions: Solution-Oriented/Positive Psychology and Eye Movement Desensitization and Reprocessing (EMDR)  Diagnosis:   ICD-10-CM   1. Post traumatic stress disorder  F43.10     Plan: Patient is to use CBT and coping skills to decrease triggered responses.  Patient is to talk to her husband about potential of him attending part of his session.  Patient is to take medication as directed.  Patient is to practice visualization of she and her father being at their park. Long-term goal: Recall the traumatic event without becoming overwhelmed with negative emotions Short-term goal: Practice implement relaxation training as a coping mechanism for tension panic stress anger and anxiety  Lina Sayre, [facility]

## 2020-11-29 ENCOUNTER — Other Ambulatory Visit: Payer: Self-pay

## 2020-11-29 ENCOUNTER — Ambulatory Visit (INDEPENDENT_AMBULATORY_CARE_PROVIDER_SITE_OTHER): Payer: Federal, State, Local not specified - PPO | Admitting: Psychiatry

## 2020-11-29 DIAGNOSIS — F4310 Post-traumatic stress disorder, unspecified: Secondary | ICD-10-CM

## 2020-11-29 NOTE — Progress Notes (Signed)
[facility] Counselor/Therapist Progress Note  Patient ID: Sheila [name], MRN: [id number],    Date: 11/29/2020  Time Spent: 52 minutes start time 10:05 AM end time 10:57 AM  Treatment Type: Individual Therapy  Reported Symptoms: anxiety, grief issues, sadness  Mental Status Exam:  Appearance:   Casual and Neat     Behavior:  Appropriate  Motor:  Normal  Speech/Language:   Normal Rate  Affect:  Appropriate tearful  Mood:  anxious and sad  Thought process:  normal  Thought content:    WNL  Sensory/Perceptual disturbances:    WNL  Orientation:  oriented to person, place, time/date and situation  Attention:  Good  Concentration:  Good  Memory:  WNL  Fund of knowledge:   Good  Insight:    Good  Judgment:   Good  Impulse Control:  Good   Risk Assessment: Danger to Self:  No Self-injurious Behavior: No Danger to Others: No Duty to Warn:no Physical Aggression / Violence:No  Access to Firearms a concern: No  Gang Involvement:No   Subjective: Patient was present for session.  She shared that the visual from last session has been very helpful for her. She went on to share her parent's anniversary is this soon and her son's graduation is coming up and those things are triggering lots of sadness. Patient explained that there have been issues recently with her oldest son because he is not pursuing what he needs to as far as college and they are having difficulties with communication.  Patient reported that prior to bedtime her husband told her that she needed to fix herself.  Patient explained that brought up lots of sadness and depression and she used her visual of beating her dad in the park to try and help ground herself so she could sleep.  Patient went on to share that she ended up having a dream about meeting him there and being able to tell them all that was going on and how she was having thoughts of not wanting to be here anymore.  Patient reported that the dream was very  peaceful for her and took care of the negative thoughts that she was having as she woke up in a much better place.  Discussed how she had just put that visual into her subconscious and how EMDR does the REM state of processing like occurs during dreaming so hopefully that will continue to be something that will be helpful for her as she works through traumas.  Discussed ways to communicate with her son during this difficult adolescent..  Discussed what could be normal and what needed to be addressed in different ways that she could address it with him through some reality therapy questions.  Patient asked that it be written out so that she can go home process it more rethink it and write it out for herself so that they can have a discussion.  Patient reported feeling much better at the end of session with a plan to try and communicate with her son appropriately and resolve some issues hopefully     Interventions: Solution-Oriented/Positive Psychology  Diagnosis:   ICD-10-CM   1. Post traumatic stress disorder  F43.10     Plan: Patient is to use CBT and coping skills to decrease triggered responses.  Patient is to continue using the visual from last session that has been very calming and peaceful for her.  Patient is to follow plans to communicate her concerns with her son and  develop different ways that they can communicate with each other that would be much more productive.  Patient is to considering allowing her husband to attend to session with clinician Long-term goal: Recall the traumatic event without becoming overwhelmed with negative emotions Short-term goal: Practice implement relaxation training as a coping mechanism for tension panic stress anger and anxiety    Lina Sayre, [facility]

## 2020-12-06 ENCOUNTER — Ambulatory Visit: Payer: Federal, State, Local not specified - PPO | Admitting: Psychiatry

## 2020-12-15 ENCOUNTER — Other Ambulatory Visit: Payer: Self-pay

## 2020-12-15 ENCOUNTER — Ambulatory Visit (INDEPENDENT_AMBULATORY_CARE_PROVIDER_SITE_OTHER): Payer: Federal, State, Local not specified - PPO | Admitting: Psychiatry

## 2020-12-15 DIAGNOSIS — F4310 Post-traumatic stress disorder, unspecified: Secondary | ICD-10-CM | POA: Diagnosis not present

## 2020-12-15 NOTE — Progress Notes (Signed)
      [facility] Counselor/Therapist Progress Note  Patient ID: Sheila [name], MRN: [id number],    Date: 12/15/2020  Time Spent: 52 minutes start time 9:06 AM end time 9:58 AM  Treatment Type: Individual Therapy  Reported Symptoms: sadness, anxiety, nightmares, flashbacks, crying spells, panic, meltdowns  Mental Status Exam:  Appearance:   Well Groomed     Behavior:  Appropriate  Motor:  Normal  Speech/Language:   Normal Rate  Affect:  Appropriate  Mood:  anxious and sad  Thought process:  normal  Thought content:    WNL  Sensory/Perceptual disturbances:    WNL  Orientation:  oriented to person, place, time/date and situation  Attention:  Good  Concentration:  Good  Memory:  WNL  Fund of knowledge:   Good  Insight:    Good  Judgment:   Good  Impulse Control:  Good   Risk Assessment: Danger to Self:  No Self-injurious Behavior: No Danger to Others: No Duty to Warn:no Physical Aggression / Violence:No  Access to Firearms a concern: No  Gang Involvement:No   Subjective: Patient was present for session.  Patient reported she is continuing to have lots of trouble functioning.  She explained that she is finding herself thinking about all the things that her father is going to miss that she can remember telling him going to his cancer treatments that he would be present for her.  Patient also shared that her son is struggling with what to do with his future in which college to attend.  She shared her husband is getting frustrated but her son is no closer to making a positive decision for himself.  She shared she is feeling very overwhelmed and having difficulty getting anything together.  Allowed time for patient just to process what was causing her to feel very overwhelmed and stressed.  She shared she was doing much better with all that she is worked on in treatment and that has helped but the other things are getting very difficult.  Encouraged her to realize that lots of what  is going on is situational and as well as grieving.  She was encouraged to try and find ways to feel her father is still present even though he is no longer living.  She was encouraged to think of things that she could do for her son that would remind him of his grandfather so that he can still feel his presents as well.  Patient was able to think of different things that she could focus on.  Taught patient box theory and encouraged her to practice that.  Interventions: Solution-Oriented/Positive Psychology  Diagnosis:   ICD-10-CM   1. Post traumatic stress disorder  F43.10     Plan: Patient is to use CBT and coping skills to decrease triggered responses.  Patient is to use box theory to help her keep perspective in the very difficult situation.  Patient is to work on taking medication as directed.  Patient is to remind herself that her father is still with her even though she cannot see him. Long-term goal: Recall the traumatic event without becoming overwhelming negative emotions Short-term goal: Practice implement relaxation training as a coping mechanism for stress anger and anxiety  Lina Sayre, [facility]

## 2020-12-19 DIAGNOSIS — N3010 Interstitial cystitis (chronic) without hematuria: Secondary | ICD-10-CM | POA: Diagnosis not present

## 2020-12-19 DIAGNOSIS — M797 Fibromyalgia: Secondary | ICD-10-CM | POA: Diagnosis not present

## 2020-12-19 DIAGNOSIS — K589 Irritable bowel syndrome without diarrhea: Secondary | ICD-10-CM | POA: Diagnosis not present

## 2020-12-19 DIAGNOSIS — R102 Pelvic and perineal pain: Secondary | ICD-10-CM | POA: Diagnosis not present

## 2020-12-20 ENCOUNTER — Ambulatory Visit (INDEPENDENT_AMBULATORY_CARE_PROVIDER_SITE_OTHER): Payer: Federal, State, Local not specified - PPO | Admitting: Psychiatry

## 2020-12-20 ENCOUNTER — Other Ambulatory Visit: Payer: Self-pay

## 2020-12-20 DIAGNOSIS — F4310 Post-traumatic stress disorder, unspecified: Secondary | ICD-10-CM

## 2020-12-20 NOTE — Progress Notes (Signed)
      [facility] Counselor/Therapist Progress Note  Patient ID: KEM PARCHER, MRN: [id number],    Date: 12/20/2020  Time Spent: 48 minutes start time 11:12 AM end time 12 PM  Treatment Type: Individual Therapy  Reported Symptoms: anxiety, sadness, triggered responses,   Mental Status Exam:  Appearance:   Casual and Neat     Behavior:  Appropriate  Motor:  Normal  Speech/Language:   Normal Rate  Affect:  Appropriate  Mood:  anxious  Thought process:  normal  Thought content:    WNL  Sensory/Perceptual disturbances:    WNL  Orientation:  oriented to person, place, time/date and situation  Attention:  Good  Concentration:  Good  Memory:  WNL  Fund of knowledge:   Good  Insight:    Good  Judgment:   Good  Impulse Control:  Good   Risk Assessment: Danger to Self:  No Self-injurious Behavior: No Danger to Others: No Duty to Warn:no Physical Aggression / Violence:No  Access to Firearms a concern: No  Gang Involvement:No   Subjective: Patient was present for session.  She shared that her son had a melt down and took things out on her.  Patient stated she was able to set a limit and handle it in a calm manner.  Patient went on to explain that she talk to her husband about the situation and he wanted to handle part of it.  Patient explained she was able to let him know that she had handled it and that he needed to leave the situation alone at this time.  Patient reported feeling really good about that because for so long she was not functioning or handling anything.  She shared that she used her coping skills from session to get her through the situation. She shared she is feeling stronger even with all that is going on in her life and with her body.  She was also told by her doctor that she is going to have a procedure done on her bladder.  Patient is preparing for spring break in a week when she will get to go to the beach.  She shared during that time she is going to try and just  relax and get perspective.  Patient was encouraged to continue practicing  Interventions: Solution-Oriented/Positive Psychology  Diagnosis:   ICD-10-CM   1. Post traumatic stress disorder  F43.10     Plan: Patient is to use CBT and coping skills to continue decreasing triggered responses.  Patient is to continue working on limit setting.  Patient is to continue exercising to release negative emotions appropriately.  Patient is to take medication as directed. Long ter goal: Recall the traumatic event without becoming overwhelmed with negative emotions Short term goal: Practice, implement relaxation training as a coping mechanism for tension panic stress anger and anxiety  Lina Sayre, [facility]

## 2020-12-27 ENCOUNTER — Other Ambulatory Visit: Payer: Self-pay

## 2020-12-27 ENCOUNTER — Ambulatory Visit (INDEPENDENT_AMBULATORY_CARE_PROVIDER_SITE_OTHER): Payer: Federal, State, Local not specified - PPO | Admitting: Psychiatry

## 2020-12-27 DIAGNOSIS — F4310 Post-traumatic stress disorder, unspecified: Secondary | ICD-10-CM | POA: Diagnosis not present

## 2020-12-27 NOTE — Progress Notes (Signed)
[facility] Counselor/Therapist Progress Note  Patient ID: Sheila [name], MRN: [id number],    Date: 12/27/2020  Time Spent: 52 minutes start time 11:03 AM end time 11:55 AM  Treatment Type: Individual Therapy  Reported Symptoms: health issues, anxiety, triggered response, focusing issues  Mental Status Exam:  Appearance:   Well Groomed     Behavior:  Appropriate  Motor:  Normal  Speech/Language:   Normal Rate  Affect:  Appropriate  Mood:  anxious  Thought process:  normal  Thought content:    WNL  Sensory/Perceptual disturbances:    WNL  Orientation:  oriented to person, place, time/date and situation  Attention:  Good  Concentration:  Good  Memory:  WNL  Fund of knowledge:   Good  Insight:    Good  Judgment:   Good  Impulse Control:  Good   Risk Assessment: Danger to Self:  No Self-injurious Behavior: No Danger to Others: No Duty to Warn:no Physical Aggression / Violence:No  Access to Firearms a concern: No  Gang Involvement:No   Subjective: Patient was present for session. She shared that things are still up and down with her son but she is handling things well even though it is stressful. She stated she has been working on her self care and she is practicing breathing exercises and she was able to go out to lunch with a friend.  Patient acknowledged how much progress that she has made in treatment and how her family is recognizing the different behaviors.  Patient reported she knows there is still many things to work through but is able to see how far she is come in treatment and is very appreciative and hopeful.  Patient went on to share that going to the beach is going to be triggering and at the same time relaxing for her.  Patient explained that her grandparents had bought a home there and lived there so they would often visit them their right up until her youngest was a child.  Her mother moved her grandmother and after 2 weeks of living with her put her into a  home where she died.  Patient explained that her grandmother had always wanted to die in the house and the house was supposed to be left to her and her sister that her mother sold it and her home in [place] and moved to [place] with her sister.  Patient explained that the reason her mother stated she did what she did was 18 months after patient's father died when she was still very traumatized and grieving her mother got breast cancer.  Patient was unable emotionally to make it to all of the appointments and her husband would fill in for her.  After her mother's surgery her sister offered to come down and stay with her mother for a week and patient acknowledged that that would be helpful since she did have young children still and was getting very overwhelmed with everything and having her own medical issues.  Patient's mother shared that it was not okay that patient did not take care of her mother after her surgery and she did not forgive her even though she apologized multiple times.  Patient stated she has come to peace with that now and recognizes that it is her mother's issue and not hers.  She is able to acknowledge she did everything she could for her mom and her mom was not understanding of her medical issues and the fact that she had small  children at the time.  Patient was encouraged to feel good about what she did for her grandparents and her father and to know she did all that she could.  Encouraged her to write a letter to her grandparents letting them know how things are going since she feels a connection when she goes to the beach with them.  Patient was also encouraged to find ways to celebrate her grandmother especially as she goes to the beach.  Interventions: Solution-Oriented/Positive Psychology  Diagnosis:   ICD-10-CM   1. Post traumatic stress disorder  F43.10     Plan: Patient is to use CBT and coping skills to decrease triggered responses.  Patient is to find ways to celebrate  her grandmother as she goes down to the beach.  Patient is to write her grandmother a letter updating her on all that has occurred over the past year.  Patient is to continue working with her providers on her medical issues. Long-term goal: Recall the traumatic event without becoming overwhelming negative emotions Short-term goal: Practice implement relaxation training as a coping mechanism for tension panic stress anger and anxiety  Lina Sayre, [facility]

## 2021-01-10 ENCOUNTER — Ambulatory Visit (INDEPENDENT_AMBULATORY_CARE_PROVIDER_SITE_OTHER): Payer: Federal, State, Local not specified - PPO | Admitting: Psychiatry

## 2021-01-10 ENCOUNTER — Other Ambulatory Visit: Payer: Self-pay

## 2021-01-10 DIAGNOSIS — F4310 Post-traumatic stress disorder, unspecified: Secondary | ICD-10-CM

## 2021-01-10 NOTE — Progress Notes (Signed)
      [facility] Counselor/Therapist Progress Note  Patient ID: JARETSSI KRAKER, MRN: [id number],    Date: 01/10/2021  Time Spent: 52 minutes start time 11:03 AM end time 11:55 AM  Treatment Type: Individual Therapy  Reported Symptoms: Anxiety, panic, sadness, crying spells  Mental Status Exam:  Appearance:   Well Groomed     Behavior:  Appropriate  Motor:  Normal  Speech/Language:   Normal Rate  Affect:  Appropriate  Mood:  anxious  Thought process:  normal  Thought content:    WNL  Sensory/Perceptual disturbances:    WNL  Orientation:  oriented to person, place, time/date and situation  Attention:  Good  Concentration:  Good  Memory:  WNL  Fund of knowledge:   Good  Insight:    Good  Judgment:   Good  Impulse Control:  Good   Risk Assessment: Danger to Self:  No Self-injurious Behavior: No Danger to Others: No Duty to Warn:no Physical Aggression / Violence:No  Access to Firearms a concern: No  Gang Involvement:No   Subjective: Patient was present for session.  Patient reported that she had a very positive time on her vacation but then when she was 30 minutes out of town she started feeling the extreme anxiety and panic again.  Patient stated that while she was on vacation her children were able to have memories of her grandmother and that was extremely powerful for her.  Patient did EMDR set on the thought of coming home, suds level 10, negative cognition "I do not have a place to escape" felt stuck in her chest.  Patient was able to reduce suds level to 4.  Was encouraged to work on visualizations from session to remind herself that she can get away.  Patient is to listen to the beach sounds on her Fitbit.  Patient is to plan getaways to the beach to give her something to look forward to.  Interventions: Solution-Oriented/Positive Psychology and Eye Movement Desensitization and Reprocessing (EMDR)  Diagnosis:   ICD-10-CM   1. Post traumatic stress disorder  F43.10      Plan: Patient is to use CBT and coping skills to decrease triggered responses.  Patient is to use her calm app on her Fitbit-Beach sounds to relax.  Patient is to plan trips to the beach to look forward to.  Patient is to practice visualizations from session. Long-term goal: Recall the traumatic event without becoming overwhelming negative emotions Short-term goal: Practice implement relaxation training as a coping mechanism for tension panic stress anger and anxiety  Lina Sayre, [facility]

## 2021-01-19 ENCOUNTER — Other Ambulatory Visit: Payer: Self-pay

## 2021-01-19 ENCOUNTER — Ambulatory Visit (INDEPENDENT_AMBULATORY_CARE_PROVIDER_SITE_OTHER): Payer: Federal, State, Local not specified - PPO | Admitting: Psychiatry

## 2021-01-19 ENCOUNTER — Telehealth: Payer: Self-pay | Admitting: Psychiatry

## 2021-01-19 DIAGNOSIS — F4310 Post-traumatic stress disorder, unspecified: Secondary | ICD-10-CM

## 2021-01-19 DIAGNOSIS — F410 Panic disorder [episodic paroxysmal anxiety] without agoraphobia: Secondary | ICD-10-CM

## 2021-01-19 MED ORDER — DESVENLAFAXINE SUCCINATE ER 50 MG PO TB24: 50.0000 mg | ORAL_TABLET | Freq: Every day | ORAL | 1 refills | Status: DC

## 2021-01-19 NOTE — Progress Notes (Signed)
[facility] Counselor/Therapist Progress Note  Patient ID: Sheila [name], MRN: [id number],    Date: 01/19/2021  Time Spent: 52 minutes 11:05 AM end time 11:57 AM  Treatment Type: Individual Therapy  Reported Symptoms: health issues, depression, anxiety, nightmares, sleep issues,   Mental Status Exam:  Appearance:   Well Groomed     Behavior:  Appropriate  Motor:  Normal  Speech/Language:   Normal Rate  Affect:  Appropriate  Mood:  anxious and sad  Thought process:  normal  Thought content:    WNL  Sensory/Perceptual disturbances:    WNL  Orientation:  oriented to person, place, time/date and situation  Attention:  Good  Concentration:  Good  Memory:  WNL  Fund of knowledge:   Good  Insight:    Good  Judgment:   Good  Impulse Control:  Good   Risk Assessment: Danger to Self:  No Self-injurious Behavior: No Danger to Others: No Duty to Warn:no Physical Aggression / Violence:No  Access to Firearms a concern: No  Gang Involvement:No   Subjective: Patient was present for session.  She shared she has been having a hard time due to an increase of hearing sirens and health issues. She shared she is using her coping skills to help her but it is still  Hard.  Patient was encouraged to try and think through what was triggering her emotions.  Patient was able to recognize the fact that her son is getting closer to graduation and she had promised her died that he would be present and he is not is very difficult for her.  Patient explained she gets different reminders of him and her grandparents that she is lost and that just makes her even more emotional.  Encouraged patient to try and change the reminders from something difficult to something encouraging and to recognize that they are still present in her heart and in her thoughts even if they are physically not here.  Patient was encouraged to think of ways that she could help her son to feel her father's presence during  graduation as well.  She shared that she could give him his wedding band that she has to wear during the ceremony.  Patient was encouraged to work on her self talk and to take time to take care of herself even when it is difficult.  She was encouraged to write down all the positive memories in the evenings before she goes to bed to see if that helps decrease nightmares.  She was also encouraged to have her kids talk about the memories of her father and grandparents on Mother's Day since that seems to bring her joy and helps her remember that they are not forgetting them.  Interventions: Cognitive Behavioral Therapy and Solution-Oriented/Positive Psychology  Diagnosis:   ICD-10-CM   1. Post traumatic stress disorder  F43.10     Plan: Patient is to use CBT and coping skills to decrease triggered responses.  Patient is to work on journaling happy things before going to bed at night.  Patient is to continue using her calm app on her Fitbit.  Patient is to try and exercise to release negative emotions.  Patient is to remind herself when she sees [employer] that her grandparents and father are still present with her.  Patient is to continue taking medication as directed. Long-term goal: Recall the traumatic event without becoming overwhelming negative emotions Short-term goal: Practice implement relaxation training as a coping mechanism for tension panic stress  anger and anxiety  Sheila [name], [facility]

## 2021-01-19 NOTE — Telephone Encounter (Signed)
-----   Message from [place], [facility] sent at 01/19/2021 11:07 AM EDT ----- Patient is on her last bottle of Pristiq and will need a refill within the next few weeks.  She just wanted me to let you know

## 2021-01-25 ENCOUNTER — Ambulatory Visit: Payer: Federal, State, Local not specified - PPO | Admitting: Psychiatry

## 2021-01-31 ENCOUNTER — Ambulatory Visit (INDEPENDENT_AMBULATORY_CARE_PROVIDER_SITE_OTHER): Payer: Federal, State, Local not specified - PPO | Admitting: Psychiatry

## 2021-01-31 ENCOUNTER — Other Ambulatory Visit: Payer: Self-pay

## 2021-01-31 DIAGNOSIS — F4310 Post-traumatic stress disorder, unspecified: Secondary | ICD-10-CM

## 2021-01-31 NOTE — Progress Notes (Signed)
      [facility] Counselor/Therapist Progress Note  Patient ID: Sheila [name], MRN: [id number],    Date: 01/31/2021  Time Spent: 52 minutes start time 9:02 AM end time 9:54 AM   Treatment Type: Individual Therapy  Reported Symptoms: sadness, anxiety, flashbacks, crying spells, health issues, panic  Mental Status Exam:  Appearance:   Well Groomed     Behavior:  Appropriate  Motor:  Normal  Speech/Language:   Normal Rate  Affect:  Appropriate and Tearful  Mood:  anxious and sad  Thought process:  normal  Thought content:    WNL  Sensory/Perceptual disturbances:    WNL  Orientation:  oriented to person, place, time/date and situation  Attention:  Good  Concentration:  Good  Memory:  WNL  Fund of knowledge:   Good  Insight:    Good  Judgment:   Good  Impulse Control:  Good   Risk Assessment: Danger to Self:  No Self-injurious Behavior: No Danger to Others: No Duty to Warn:no Physical Aggression / Violence:No  Access to Firearms a concern: No  Gang Involvement:No   Subjective: Patient was present for session.  She shared she lost her 5th friend to cancer yesterday in the past year.  She shared that has been extremely triggering.  Her husband is not feeling well and doesn't want to go to the doctor.  Patient's physical health is bad, she has extreme nose bleeds and is coughing up blood.  Patient reported she is very disturbed because she is still seeing her dad riding in the car with her.  Did EMDR set on that picture, suds level 10, negative cognition "I failed" felt sadness and sorrow all over.  Patient was able to reduce suds level to 5.  She was able to remind herself that she did all she could to help her dad.  Discussed different ways that she can still feel some form of connection with him including thinking about the part they used to go to and thinking about him and her grandparents whenever she sees [place].  Patient shared that her nephew will be moving in with them  for a few weeks to recover from the surgery and she knows that we will keep her busy.  Patient was encouraged to use a as needed medication if needed when intrusive thoughts surface that are very disturbing to her.  Interventions: Solution-Oriented/Positive Psychology and Eye Movement Desensitization and Reprocessing (EMDR)  Diagnosis:   ICD-10-CM   1. Post traumatic stress disorder  F43.10     Plan: Patient is to use CBT and coping skills to decrease triggered responses.  Patient is to practice visuals in session to help continue feeling connection with her father.  Patient is to release negative emotions appropriately through journaling and movement.  Patient is to take medication as directed. Long-term goal: Recall the traumatic event without becoming overwhelming negative emotions Short-term goal: Practice implement relaxation training as a coping mechanism for tension panic stress anger and anxiety  Lina Sayre, [facility]

## 2021-02-07 ENCOUNTER — Ambulatory Visit: Payer: Federal, State, Local not specified - PPO | Admitting: Psychiatry

## 2021-02-09 ENCOUNTER — Telehealth: Payer: Federal, State, Local not specified - PPO | Admitting: Family Medicine

## 2021-02-09 ENCOUNTER — Other Ambulatory Visit: Payer: Self-pay

## 2021-02-09 ENCOUNTER — Telehealth (INDEPENDENT_AMBULATORY_CARE_PROVIDER_SITE_OTHER): Payer: Federal, State, Local not specified - PPO | Admitting: Family Medicine

## 2021-02-09 ENCOUNTER — Encounter: Payer: Self-pay | Admitting: Family Medicine

## 2021-02-09 DIAGNOSIS — U071 COVID-19: Secondary | ICD-10-CM

## 2021-02-09 MED ORDER — PREDNISONE 10 MG PO TABS: ORAL_TABLET | ORAL | 0 refills | Status: DC

## 2021-02-09 MED ORDER — PAXLOVID 20 X 150 MG & 10 X 100MG PO TBPK: 3.0000 | ORAL_TABLET | Freq: Two times a day (BID) | ORAL | 0 refills | Status: DC

## 2021-02-09 NOTE — Progress Notes (Signed)
MyChart Video Visit    Virtual Visit via Video Note   This visit type was conducted due to national recommendations for restrictions regarding the COVID-19 Pandemic (e.g. social distancing) in an effort to limit this patient's exposure and mitigate transmission in our community. This patient is at least at moderate risk for complications without adequate follow up. This format is felt to be most appropriate for this patient at this time. Physical exam was limited by quality of the video and audio technology used for the visit. Sheila [name] was able to get the patient set up on a video visit.  Patient location: Home Patient and provider in visit Provider location: Office  I discussed the limitations of evaluation and management by telemedicine and the availability of in person appointments. The patient expressed understanding and agreed to proceed.  Visit Date: 02/09/2021 Today's healthcare provider: Ann Held, DO     Subjective:    Patient ID: Sheila [name], female    DOB: [birth year], 55 y.o.   MRN: [id number]  Chief Complaint  Patient presents with  . COVID Sxs    Pt states having fever, congestion, and sore throat. Pt tested negative for COVID on Monday but states her kids tested positive earlier this week.    HPI Patient is in today for covid exposure last Friday.   2 other fam members started having symp sat and test mon ---  Positive Pt started running fever 100 yesterday and sore throat and congestion , headache and chills -- they started last night  She is not really coughing but is tired.  Her home test was neg on Monday.   She has not taken another one   Past Medical History:  Diagnosis Date  . Anal fissure   . Anxiety   . Asthma    uses daily  . Chronic interstitial cystitis   . Crohn disease ([place])   . Fibromyalgia   . Migraines   . Pneumonia Dec 05, 2005  . SLE (systemic lupus erythematosus) ([place])     Past Surgical History:  Procedure Laterality  Date  . BLADDER SURGERY    . CESAREAN SECTION    . CYSTOSCOPY    . KNEE ARTHROSCOPY    . LAPAROSCOPIC VAGINAL HYSTERECTOMY WITH SALPINGO OOPHORECTOMY Bilateral 01/24/2020   Procedure: LAPAROSCOPIC ASSISTED VAGINAL HYSTERECTOMY WITH BILATERAL OOPHORECTOMY AND LEFT  SALPINGECTOMY;  Surgeon: Arvella Nigh, MD;  Location: [facility];  Service: Gynecology;  Laterality: Bilateral;  need bed  . SALPINGECTOMY Right    right  . TONSILLECTOMY    . WRIST SURGERY      Family History  Problem Relation Age of Onset  . Breast cancer Mother   . Lung cancer Father        Died in December 06, 2011  . Heart disease Father   . ADD / ADHD Son   . Breast cancer Maternal Grandmother   . Ovarian cancer Maternal Grandmother   . Breast cancer Maternal Aunt   . Prostate cancer Maternal Grandfather   . Colon cancer Maternal Grandfather   . Irritable bowel syndrome Sister   . Ovarian cancer Maternal Aunt   . Colon polyps Neg Hx   . Esophageal cancer Neg Hx   . Kidney disease Neg Hx   . Gallbladder disease Neg Hx   . Rectal cancer Neg Hx   . Stomach cancer Neg Hx     Social History   Socioeconomic History  . Marital status: Married    Spouse  name: Not on file  . Number of children: 2  . Years of education: Not on file  . Highest education level: Not on file  Occupational History  . Occupation: Print production planner  Tobacco Use  . Smoking status: Never Smoker  . Smokeless tobacco: Never Used  Vaping Use  . Vaping Use: Never used  Substance and Sexual Activity  . Alcohol use: No    Alcohol/week: 0.0 standard drinks  . Drug use: No  . Sexual activity: Yes  Other Topics Concern  . Not on file  Social History Narrative  . Not on file   Social Determinants of Health   Financial Resource Strain: Not on file  Food Insecurity: Not on file  Transportation Needs: Not on file  Physical Activity: Not on file  Stress: Not on file  Social Connections: Not on file  Intimate Partner Violence: Not  on file    Outpatient Medications Prior to Visit  Medication Sig Dispense Refill  . ALPRAZolam (XANAX) 0.5 MG tablet TAKE 1 TABLET(0.5 MG) BY MOUTH THREE TIMES DAILY AS NEEDED (Patient taking differently: Take 0.5 mg by mouth 3 (three) times daily as needed for anxiety.) 90 tablet 0  . desvenlafaxine (PRISTIQ) 50 MG 24 hr tablet Take 1 tablet (50 mg total) by mouth daily. 90 tablet 1  . tiZANidine (ZANAFLEX) 4 MG tablet TAKE 1 TABLET BY MOUTH TWICE DAILY AS NEEDED 60 tablet 1  . topiramate (TOPAMAX) 100 MG tablet TAKE 1 TABLET(100 MG) BY MOUTH DAILY 30 tablet 2  . traZODone (DESYREL) 50 MG tablet 100 mg.    . azithromycin (ZITHROMAX Z-PAK) 250 MG tablet As directed 6 each 0  . promethazine-dextromethorphan (PROMETHAZINE-DM) 6.25-15 MG/5ML syrup Take 5 mLs by mouth 4 (four) times daily as needed. (Patient not taking: No sig reported) 118 mL 0  . triamcinolone cream (KENALOG) 0.1 % Apply 1 application topically 2 (two) times daily. (Patient not taking: No sig reported) 45 g 3  . azithromycin (ZITHROMAX) 250 MG tablet Take 2 tablets by mouth on day 1, followed by 1 tablet by mouth daily for 4 days. (Patient not taking: No sig reported) 6 tablet 0  . CRYSELLE-28 0.3-30 MG-MCG tablet Take 1 tablet by mouth daily. (Patient not taking: No sig reported)     No facility-administered medications prior to visit.    Allergies  Allergen Reactions  . Ampicillin   . Cymbalta [Duloxetine Hcl] Nausea Only    Constipation, bloating  . Doxycycline Diarrhea  . Fentanyl Citrate Nausea And Vomiting    fever  . Penicillins     Review of Systems  Constitutional: Positive for fever.  HENT: Positive for congestion and sore throat.   Respiratory: Negative for cough, sputum production and shortness of breath.   Neurological: Positive for headaches.       Objective:    Physical Exam Vitals and nursing note reviewed.  Constitutional:      Appearance: Normal appearance.  Pulmonary:     Effort:  Pulmonary effort is normal.  Neurological:     Mental Status: She is alert.  Psychiatric:        Behavior: Behavior normal.        Thought Content: Thought content normal.     There were no vitals taken for this visit. Wt Readings from Last 3 Encounters:  05/18/20 139 lb 12.8 oz (63.4 kg)  01/24/20 150 lb 12.8 oz (68.4 kg)  01/18/20 145 lb (65.8 kg)    Diabetic Foot Exam - Simple  No data filed    Lab Results  Component Value Date   WBC 7.3 05/18/2020   HGB 14.3 05/18/2020   HCT 42.8 05/18/2020   PLT 316 05/18/2020   GLUCOSE 90 08/23/2020   CHOL 249 (H) 08/23/2020   TRIG 126.0 08/23/2020   HDL 65.60 08/23/2020   LDLCALC 158 (H) 08/23/2020   ALT 9 08/23/2020   AST 16 08/23/2020   NA 142 08/23/2020   K 4.0 08/23/2020   CL 105 08/23/2020   CREATININE 1.00 08/23/2020   BUN 15 08/23/2020   CO2 29 08/23/2020   TSH 1.00 05/18/2020    Lab Results  Component Value Date   TSH 1.00 05/18/2020   Lab Results  Component Value Date   WBC 7.3 05/18/2020   HGB 14.3 05/18/2020   HCT 42.8 05/18/2020   MCV 89.2 05/18/2020   PLT 316 05/18/2020   Lab Results  Component Value Date   NA 142 08/23/2020   K 4.0 08/23/2020   CO2 29 08/23/2020   GLUCOSE 90 08/23/2020   BUN 15 08/23/2020   CREATININE 1.00 08/23/2020   BILITOT 0.5 08/23/2020   ALKPHOS 122 (H) 08/23/2020   AST 16 08/23/2020   ALT 9 08/23/2020   PROT 7.6 08/23/2020   ALBUMIN 4.5 08/23/2020   CALCIUM 9.9 08/23/2020   ANIONGAP 10 01/20/2020   GFR 64.82 08/23/2020   Lab Results  Component Value Date   CHOL 249 (H) 08/23/2020   Lab Results  Component Value Date   HDL 65.60 08/23/2020   Lab Results  Component Value Date   LDLCALC 158 (H) 08/23/2020   Lab Results  Component Value Date   TRIG 126.0 08/23/2020   Lab Results  Component Value Date   CHOLHDL 4 08/23/2020   No results found for: HGBA1C     Assessment & Plan:   Problem List Items Addressed This Visit   None   Visit Diagnoses     COVID-19    -  Primary   Relevant Medications   Nirmatrelvir & Ritonavir (PAXLOVID) 20 x 150 MG & 10 x 100MG TBPK   predniSONE (DELTASONE) 10 MG tablet        Meds ordered this encounter  Medications  . Nirmatrelvir & Ritonavir (PAXLOVID) 20 x 150 MG & 10 x 100MG TBPK    Sig: Take 3 tablets by mouth in the morning and at bedtime.    Dispense:  30 tablet    Refill:  0  . predniSONE (DELTASONE) 10 MG tablet    Sig: TAKE 3 TABLETS PO QD FOR 3 DAYS THEN TAKE 2 TABLETS PO QD FOR 3 DAYS THEN TAKE 1 TABLET PO QD FOR 3 DAYS THEN TAKE 1/2 TAB PO QD FOR 3 DAYS    Dispense:  20 tablet    Refill:  0    I discussed the assessment and treatment plan with the patient. The patient was provided an opportunity to ask questions and all were answered. The patient agreed with the plan and demonstrated an understanding of the instructions.   The patient was advised to call back or seek an in-person evaluation if the symptoms worsen or if the condition fails to improve as anticipate   Ann Held, [place] [facility] [employer] [facility] [phone] (phone) [phone] (fax)  [place]

## 2021-02-09 NOTE — Assessment & Plan Note (Signed)
paxlovid  pred taper otc cough med if needed  Call back if any worsening symptoms

## 2021-02-13 ENCOUNTER — Telehealth: Payer: Federal, State, Local not specified - PPO | Admitting: Family Medicine

## 2021-02-20 ENCOUNTER — Ambulatory Visit (INDEPENDENT_AMBULATORY_CARE_PROVIDER_SITE_OTHER): Payer: Federal, State, Local not specified - PPO | Admitting: Psychiatry

## 2021-02-20 ENCOUNTER — Other Ambulatory Visit: Payer: Self-pay

## 2021-02-20 ENCOUNTER — Ambulatory Visit: Payer: Federal, State, Local not specified - PPO | Admitting: Psychiatry

## 2021-02-20 DIAGNOSIS — F4310 Post-traumatic stress disorder, unspecified: Secondary | ICD-10-CM | POA: Diagnosis not present

## 2021-02-20 NOTE — Progress Notes (Signed)
[facility] Counselor/Therapist Progress Note  Patient ID: TAMU GOLZ, MRN: [id number],    Date: 02/20/2021  Time Spent: 52 minutes start time 4:06 PM end time 4:58 PM  Treatment Type: Individual Therapy  Reported Symptoms: triggered responses, grief issues, sadness, anxiety, crying spells, overwhelmed  Mental Status Exam:  Appearance:   Well Groomed     Behavior:  Appropriate  Motor:  Normal  Speech/Language:   Normal Rate  Affect:  Appropriate  Mood:  anxious and sad  Thought process:  normal  Thought content:    WNL  Sensory/Perceptual disturbances:    WNL  Orientation:  oriented to person, place, time/date and situation  Attention:  Good  Concentration:  Good  Memory:  WNL  Fund of knowledge:   Good  Insight:    Good  Judgment:   Good  Impulse Control:  Good   Risk Assessment: Danger to Self:  No Self-injurious Behavior: No Danger to Others: No Duty to Warn:no Physical Aggression / Violence:No  Access to Firearms a concern: No  Gang Involvement:No   Subjective: Patient was present for session.  She shared that she made it through her son's graduation even thought it was hard.  She was able to ask her son to wear her father's ring and he did.  She shared she was taking care of her nephew after his surgery.  She let her son go to orientation with a friend and his family.  She did not get to attend her friend's funeral.  Patient reported she is working to try and take care of herself but ends up still having to take care of others.  She shared whenever she or her husband do not fall into the typical patterns as they used to in the past of caretaking it seems to upset others and they seem to disconnect from them.  Patient shared that that has been very hard for her.  Encouraged her to recognize that that is not her issue and that still her self-care is a priority even if others are not able to understand it as they should.  Patient explained that she wants to get  away but she is not sure how to make that happen.  [place].  Patient explained that is triggering for her because it makes her remember how her mom acted and took that peaceful home away from them.  Patient was encouraged to recognize that she has to find small ways to get away from her home when she starts feeling that overwhelmed emotion.  Reminded her that she can go to the park that she used to walk with her father at and how there is a connection there for her which is helpful so she needs to utilize that.  Also discussed different places close to hear that she could go to just for day trips with her dog just to get perspective and refocus when she is getting overwhelmed and extremely triggered.  Patient agreed to try and start working on that and thinking through more proactively how to take care of herself and release the emotions from her body.  Was also encouraged to recognize all she has overcome and that struggling through all of the recent trauma would be very normal since she has had multiple people that she has had to care for with cancer or die from cancer in her life.  Interventions: Cognitive Behavioral Therapy and Solution-Oriented/Positive Psychology  Diagnosis:   ICD-10-CM   1. Post traumatic stress disorder  F43.10     Plan: Patient is to use CBT and coping skills to decrease triggered responses.  Patient is to work on getting out of the house on day trips whenever she starts feeling triggered.  Patient is to go to the park where she used to walk with her dad so she can feel a connection and closeness with him.  Patient is to continue work on setting limits with others and focusing on her own self-care rather than caring for others when she is not at a place to do that.  Patient is to take medication as directed. Long-term goal: Recall the traumatic event without becoming overwhelming negative emotions Short-term goal: Practice  implement relaxation training as a coping mechanism for tension panic stress anger and anxiety  Lina Sayre, [facility]

## 2021-02-27 ENCOUNTER — Ambulatory Visit: Payer: Federal, State, Local not specified - PPO | Admitting: Psychiatry

## 2021-03-06 ENCOUNTER — Other Ambulatory Visit: Payer: Self-pay

## 2021-03-06 ENCOUNTER — Ambulatory Visit (INDEPENDENT_AMBULATORY_CARE_PROVIDER_SITE_OTHER): Payer: Federal, State, Local not specified - PPO | Admitting: Psychiatry

## 2021-03-06 DIAGNOSIS — F4310 Post-traumatic stress disorder, unspecified: Secondary | ICD-10-CM

## 2021-03-06 NOTE — Progress Notes (Signed)
[facility] Counselor/Therapist Progress Note  Patient ID: Sheila [name], MRN: [id number],    Date: 03/06/2021  Time Spent: 52 minutes start time 11:59 AM 12:51 PM  Treatment Type: Individual Therapy  Reported Symptoms: panic, anxiety, triggered responses, grief issues, intrusive thoughts, sleep issues, fatigue, pain issues, crying spells, stomach issues, headaches  Mental Status Exam:  Appearance:   Well Groomed     Behavior:  Appropriate  Motor:  Normal  Speech/Language:   Normal Rate  Affect:  Appropriate and Tearful  Mood:  anxious and sad  Thought process:  normal  Thought content:    Rumination  Sensory/Perceptual disturbances:    WNL  Orientation:  oriented to person, place, time/date, and situation  Attention:  Good  Concentration:  Good  Memory:  WNL  Fund of knowledge:   Good  Insight:    Good  Judgment:   Good  Impulse Control:  Good   Risk Assessment: Danger to Self:   Patient reported having thoughts at times but contracts for safety and is not having current thoughts. Self-injurious Behavior: No Danger to Others: No Duty to Warn:no Physical Aggression / Violence:No  Access to Firearms a concern: No  Gang Involvement:No   Subjective: Patient was present for session.  She shared she is very sick physically and mentally.  She reported that her son is preparing for college and the other son is going to start driving and patient reported feeling she isn't together enough to manage everything.  Patient explained that she recognizes all the stress she has been feeling is impacting her physically and her Crohn's is very out of control as well as her urinary cystitis and that has created extreme fatigue.  Asked patient if she had had any blood work to confirm if there is something that can be done or something in particular she can do to try and help deal with the fatigue.  She shared she realizes she needs to contact her PCP to get that done encouraged her to do  that ASAP.  Patient was encouraged to think through the different situations that are creating stress for her.  She shared the biggest stressor currently is trying to prepare her oldest son to leave for college and he is not following through on what he needs to and at the same time she cannot do certain things because of his age and not having the ability to any longer now that he is an adult.  Discussed different ways that she can communicate with him about her concerns and frustrations and the timeline that needs to be completed for him to be able to go to school in the fall.  Once she has communicated with him she was encouraged to remind herself that it is in his box and she has to let him figure things out and if that means he does not go to college in the fall that is okay.  Patient stated she is realizing he may not make it but 1 semester and she is okay with that.  Encouraged her to take more pressure off of herself then and to realize she needs to be there for him but she needs to let him figure out what needs to happen so that he can function on his own as an adult.  Patient explained that typically she would have talked to her father about the situations and him not being here is creating more sadness and anxiety.  Patient was encouraged to continue  affirming herself and reminding herself that she can connect with him anytime by going to the park in her head or physically.  Patient reported feeling more positive at the end of session.  Interventions: Cognitive Behavioral Therapy, Motivational Interviewing, and Solution-Oriented/Positive Psychology  Diagnosis:   ICD-10-CM   1. Post traumatic stress disorder  F43.10       Plan: Patient is to use CBT and coping skills to decrease triggered responses.  Patient is to continue utilizing visualizations from sessions to help ground herself as needed.  Patient is to follow plans to communicate concerns with her son and to help him think through how he  wants to handle situations.  Patient is to continue taking medication as directed.  Patient is to work with primary care physician to get labs drawn to see if something is going on creating the extreme fatigue. Long-term goal: Recall the traumatic event without becoming overwhelming negative emotions Short-term goal: Practice implement relaxation training as a coping mechanism for tension panic stress anger and anxiety  Lina Sayre, [facility]

## 2021-03-07 ENCOUNTER — Ambulatory Visit (HOSPITAL_BASED_OUTPATIENT_CLINIC_OR_DEPARTMENT_OTHER)
Admission: RE | Admit: 2021-03-07 | Discharge: 2021-03-07 | Disposition: A | Discharge status: Home / Self Care | Payer: Federal, State, Local not specified - PPO | Source: Ambulatory Visit | Attending: Medical | Admitting: Medical

## 2021-03-07 ENCOUNTER — Ambulatory Visit: Payer: Federal, State, Local not specified - PPO | Admitting: Medical

## 2021-03-07 ENCOUNTER — Encounter: Payer: Self-pay | Admitting: Medical

## 2021-03-07 ENCOUNTER — Telehealth: Payer: Self-pay

## 2021-03-07 VITALS — BP 105/63 | HR 83 | Temp 98.6°F | Resp 12 | Ht 64.0 in | Wt 138.4 lb

## 2021-03-07 DIAGNOSIS — R059 Cough, unspecified: Secondary | ICD-10-CM | POA: Insufficient documentation

## 2021-03-07 DIAGNOSIS — R040 Epistaxis: Secondary | ICD-10-CM

## 2021-03-07 DIAGNOSIS — R5382 Chronic fatigue, unspecified: Secondary | ICD-10-CM

## 2021-03-07 MED ORDER — OLOPATADINE HCL 0.1 % OP SOLN: 1.0000 [drp] | Freq: Two times a day (BID) | OPHTHALMIC | 12 refills | Status: DC

## 2021-03-07 NOTE — Patient Instructions (Addendum)
Description of chronic intermittent nosebleeds over the years.  Worse over the past year.  Most recently increasing severity.  Currently none unexplained on exam.  Discussed possible causes of her pain might be from [place] baux plexus.  Does have history of allergic rhinitis.  Presently recommending to use humidifier at night while sleeping.  I use Vaseline to anterior/medial tip of nostril.  Use Claritin over-the-counter.  In 1 week could use saline nasal spray  once particularly if she has any nasal congestion as to potential allergen exposure.  You expressed reluctance to go the emergency department for  nosebleed.  So explained that Afrin soaked gauze could be tried if you have severe nose bleed.  If  uses may stop heavy nosebleed rather than you having to go to emergency department.  However if you do have severe nosebleed despite using Afrin and then to recommend ED evaluation  Patonol for itching eyes.  Cxr since describes sometimes coughs up blood.  Follow up in 10-14 days or as needed     Afrin.   Ent referral.

## 2021-03-07 NOTE — Progress Notes (Signed)
Subjective:    Patient ID: Sheila [name], female    DOB: 06-22-1966, 55 y.o.   MRN: [id number]  HPI  Pt in for years of intermittent nose bleeds. Last nose bleed last night. She had the bleed on rt side.   She states the bleed is much more frequent.  She states last night she coughed up blood. She states was severe bleeding last night.  Pt has some mild itching eyes.(Then states mild hurt at times)  Pt has allergies. Fall is worse. Some in Spring.   Pt states over past year she had multiple nose bleeds a day.  On triage advised to see ED. Pt declined so I am seeing her since she refused.   More than a year ago scan of sinus was negative.   Pt is not on any allergy med presently.   Review of Systems  Constitutional:  Positive for fatigue. Negative for chills and fever.  HENT:  Positive for nosebleeds. Negative for congestion.   Respiratory:  Negative for cough, chest tightness, shortness of breath and wheezing.   Cardiovascular:  Negative for chest pain, palpitations and leg swelling.  Gastrointestinal:  Negative for abdominal pain.  Neurological:  Negative for dizziness, light-headedness and headaches.  Hematological:  Negative for adenopathy. Does not bruise/bleed easily.  Psychiatric/Behavioral:  Negative for behavioral problems, confusion and hallucinations.     Past Medical History:  Diagnosis Date   Anal fissure    Anxiety    Asthma    uses daily   Chronic interstitial cystitis    Crohn disease ([place])    Fibromyalgia    Migraines    Pneumonia 09-Dec-2005   SLE (systemic lupus erythematosus) ([place])      Social History   Socioeconomic History   Marital status: Married    Spouse name: Not on file   Number of children: 2   Years of education: Not on file   Highest education level: Not on file  Occupational History   Occupation: Preschool teacher  Tobacco Use   Smoking status: Never   Smokeless tobacco: Never  Vaping Use   Vaping Use: Never used  Substance  and Sexual Activity   Alcohol use: No    Alcohol/week: 0.0 standard drinks   Drug use: No   Sexual activity: Yes  Other Topics Concern   Not on file  Social History Narrative   Not on file   Social Determinants of Health   Financial Resource Strain: Not on file  Food Insecurity: Not on file  Transportation Needs: Not on file  Physical Activity: Not on file  Stress: Not on file  Social Connections: Not on file  Intimate Partner Violence: Not on file    Past Surgical History:  Procedure Laterality Date   BLADDER SURGERY     CESAREAN SECTION     CYSTOSCOPY     KNEE ARTHROSCOPY     LAPAROSCOPIC VAGINAL HYSTERECTOMY WITH SALPINGO OOPHORECTOMY Bilateral 01/24/2020   Procedure: LAPAROSCOPIC ASSISTED VAGINAL HYSTERECTOMY WITH BILATERAL OOPHORECTOMY AND LEFT  SALPINGECTOMY;  Surgeon: Arvella Nigh, MD;  Location: [facility];  Service: Gynecology;  Laterality: Bilateral;  need bed   SALPINGECTOMY Right    right   TONSILLECTOMY     WRIST SURGERY      Family History  Problem Relation Age of Onset   Breast cancer Mother    Lung cancer Father        Died in 12/10/2011   Heart disease Father    ADD /  ADHD Son    Breast cancer Maternal Grandmother    Ovarian cancer Maternal Grandmother    Breast cancer Maternal Aunt    Prostate cancer Maternal Grandfather    Colon cancer Maternal Grandfather    Irritable bowel syndrome Sister    Ovarian cancer Maternal Aunt    Colon polyps Neg Hx    Esophageal cancer Neg Hx    Kidney disease Neg Hx    Gallbladder disease Neg Hx    Rectal cancer Neg Hx    Stomach cancer Neg Hx     Allergies  Allergen Reactions   Ampicillin    Cymbalta [Duloxetine Hcl] Nausea Only    Constipation, bloating   Doxycycline Diarrhea   Fentanyl Citrate Nausea And Vomiting    fever   Penicillins     Current Outpatient Medications on File Prior to Visit  Medication Sig Dispense Refill   ALPRAZolam (XANAX) 0.5 MG tablet TAKE 1 TABLET(0.5 MG) BY  MOUTH THREE TIMES DAILY AS NEEDED (Patient taking differently: Take 0.5 mg by mouth 3 (three) times daily as needed for anxiety.) 90 tablet 0   desvenlafaxine (PRISTIQ) 50 MG 24 hr tablet Take 1 tablet (50 mg total) by mouth daily. 90 tablet 1   tiZANidine (ZANAFLEX) 4 MG tablet TAKE 1 TABLET BY MOUTH TWICE DAILY AS NEEDED 60 tablet 1   topiramate (TOPAMAX) 100 MG tablet TAKE 1 TABLET(100 MG) BY MOUTH DAILY 30 tablet 2   traZODone (DESYREL) 50 MG tablet 100 mg.     No current facility-administered medications on file prior to visit.    BP 105/63 (BP Location: Left Arm, Cuff Size: Normal)   Pulse 83   Temp 98.6 F (37 C) (Oral)   Resp 12   Ht 5' 4"  (1.626 m)   Wt 138 lb 6.4 oz (62.8 kg) Comment: with shoes  SpO2 97%   BMI 23.76 kg/m       Objective:   Physical Exam  General Mental Status- Alert. General Appearance- Not in acute distress.   Skin General: Color- Normal Color. Moisture- Normal Moisture.  Neck Carotid Arteries- Normal color. Moisture- Normal Moisture. No carotid bruits. No JVD.  Chest and Lung Exam Auscultation: Breath Sounds:-Normal.  Cardiovascular Auscultation:Rythm- Regular. Murmurs & Other Heart Sounds:Auscultation of the heart reveals- No Murmurs.  Abdomen Inspection:-Inspeection Normal. Palpation/Percussion:Note:No mass. Palpation and Percussion of the abdomen reveal- Non Tender, Non Distended + BS, no rebound or guarding.   Neurologic Cranial Nerve exam:- CN III-XII intact(No nystagmus), symmetric smile. Strength:- 5/5 equal and symmetric strength both upper and lower extremities.   Heent- no sinus pressure. Nares clear. No bleeding seen today. Thought nostrils little narrow and can't inside nares well.    Assessment & Plan:   Description of chronic intermittent nosebleeds over the years.  Worse over the past year.  Most recently increasing severity.  Currently none unexplained on exam.  Discussed possible causes of her pain might be from  [place] baux plexus.  Does have history of allergic rhinitis.  Presently recommending to use humidifier at night while sleeping.  I use Vaseline to anterior/medial tip of nostril.  Use Claritin over-the-counter.  In 1 week could use saline nasal spray  once particularly if she has any nasal congestion as to potential allergen exposure.  You expressed reluctance to go the emergency department for  nosebleed.  So explained that Afrin soaked gauze could be tried if you have severe nose bleed.  If  uses may stop heavy nosebleed rather than you having  to go to emergency department.  However if you do have severe nosebleed despite using Afrin and then to recommend ED evaluation  Patonol for itching eyes.  Cxr since describes sometimes coughs up blood.  Follow up in 10-14 days or as needed  Mackie Pai, PA-C   Time spent with patient today was 41  minutes which consisted of chart review, discussing diagnosis, work up , treatment  answering questions and documentation.

## 2021-03-07 NOTE — Telephone Encounter (Signed)
Spoke with patient. Pt states noses bleeds have been going on for years and she is having concerns there is more wrong with her that is not being shown on her x-rays. Spoke with DOD Larose Kells) and he agreed with pt going to ED but pt refused and was adamant about getting appointment.Pt states she can't afford the ED financially or health wise.Pt put on with Percell Miller with additional 20 mins added to appointment.

## 2021-03-07 NOTE — Telephone Encounter (Signed)
Nurse Assessment Nurse: Zenia Resides, RN, Diane Date/Time Eilene Ghazi Time): 03/07/2021 9:22:30 AM Confirm and document reason for call. If symptomatic, describe symptoms. ---Caller states she is having nose bleeds and now is coughing up blood. Nose is currently bleeding, but was up last night with it. Happening daily out of one side of nose. Has been going on for years. Pt. has a hx of autoimmune diseases. She is having fatigue which is not normal over the last two weeks. Coughing up blood is becoming more frequent. Does the patient have any new or worsening symptoms? ---Yes Will a triage be completed? ---Yes Related visit to physician within the last 2 weeks? ---No Does the PT have any chronic conditions? (i.e. diabetes, asthma, this includes High risk factors for pregnancy, etc.) ---Yes List chronic conditions. ---Lupus, chron's, FM, interstitial cystitis, lung issues Is the patient pregnant or possibly pregnant? (Ask all females between the ages of 53-55) ---No Is this a behavioral health or substance abuse call? ---No Guidelines Guideline Title Affirmed Question Affirmed Notes Nurse Date/Time Eilene Ghazi Time) Nosebleed Dizziness or lightheadedness Zenia Resides, RN, Diane 03/07/2021 9:26:50 AM Disp. Time Eilene Ghazi Time) Disposition Final User PLEASE NOTE: All timestamps contained within this report are represented as [place] Standard Time. CONFIDENTIALTY NOTICE: This fax transmission is intended only for the addressee. It contains information that is legally privileged, confidential or otherwise protected from use or disclosure. If you are not the intended recipient, you are strictly prohibited from reviewing, disclosing, copying using or disseminating any of this information or taking any action in reliance on or regarding this information. If you have received this fax in error, please notify us immediately by telephone so that we can arrange for its return to [place]. Phone: [phone], Toll-Free:  [phone], Fax: [phone] Page: 2 of 2 Call Id: [id number] 03/07/2021 9:29:42 AM Go to ED Now Yes Zenia Resides, RN, Diane Caller Disagree/Comply Disagree Caller Understands Yes PreDisposition Call Doctor Care Advice Given Per Guideline GO TO ED NOW: * You need to be seen in the Emergency Department. CONTINUE PINCHING THE NOSTRILS TO STOP THE NOSEBLEED: * Continue pinching the nostrils until seen. ANOTHER ADULT SHOULD DRIVE: * It is better and safer if another adult drives instead of you. CARE ADVICE given per Nosebleed (Adult) guideline. Comments User: Hildred Priest, RN Date/Time Eilene Ghazi Time): 03/07/2021 9:27:26 AM Also having generalized pain User: Hildred Priest, RN Date/Time Eilene Ghazi Time): 03/07/2021 9:34:29 AM Called office to inform them that pt. had refused ER and they will reach out to her Referrals GO TO FACILITY REFUSED   Pt seen by Percell Miller today.

## 2021-03-07 NOTE — Telephone Encounter (Signed)
Triage nurse called office stating patient called complaining of frequent nose bleeds. She states she is coughing up blood however the nurse believes the coughing of blood is drainage from her nose bleeds rather than issue within the chest.   Patient states she is experiencing dizziness and generalized pain throughout the body. She was advised to go to the ER but refused. She states she is immunocompromised and wishes to only have OV in clinic.   Please advise on further instruction.

## 2021-03-08 LAB — COMPREHENSIVE METABOLIC PANEL
ALT: 8 U/L (ref 0–35)
AST: 18 U/L (ref 0–37)
Albumin: 4.7 g/dL (ref 3.5–5.2)
Alkaline Phosphatase: 121 U/L — ABNORMAL HIGH (ref 39–117)
BUN: 14 mg/dL (ref 6–23)
CO2: 26 mEq/L (ref 19–32)
Calcium: 9.9 mg/dL (ref 8.4–10.5)
Chloride: 103 mEq/L (ref 96–112)
Creatinine, Ser: 0.96 mg/dL (ref 0.40–1.20)
GFR: 67.81 mL/min (ref >60.00)
Glucose, Bld: 91 mg/dL (ref 70–99)
Potassium: 3.9 mEq/L (ref 3.5–5.1)
Sodium: 138 mEq/L (ref 135–145)
Total Bilirubin: 0.6 mg/dL (ref 0.2–1.2)
Total Protein: 8.3 g/dL (ref 6.0–8.3)

## 2021-03-08 LAB — CBC WITH DIFFERENTIAL/PLATELET
Basophils Absolute: 0.1 10*3/uL (ref 0.0–0.1)
Basophils Relative: 0.8 % (ref 0.0–3.0)
Eosinophils Absolute: 0.2 10*3/uL (ref 0.0–0.7)
Eosinophils Relative: 1.9 % (ref 0.0–5.0)
HCT: 40.7 % (ref 36.0–46.0)
Hemoglobin: 13.9 g/dL (ref 12.0–15.0)
Lymphocytes Relative: 32.9 % (ref 12.0–46.0)
Lymphs Abs: 2.8 10*3/uL (ref 0.7–4.0)
MCHC: 34.2 g/dL (ref 30.0–36.0)
MCV: 89 fl (ref 78.0–100.0)
Monocytes Absolute: 0.7 10*3/uL (ref 0.1–1.0)
Monocytes Relative: 8.7 % (ref 3.0–12.0)
Neutro Abs: 4.7 10*3/uL (ref 1.4–7.7)
Neutrophils Relative %: 55.7 % (ref 43.0–77.0)
Platelets: 324 10*3/uL (ref 150.0–400.0)
RBC: 4.57 Mil/uL (ref 3.87–5.11)
RDW: 13.9 % (ref 11.5–15.5)
WBC: 8.5 10*3/uL (ref 4.0–10.5)

## 2021-03-08 LAB — VITAMIN B12: Vitamin B-12: 223 pg/mL (ref 211–911)

## 2021-03-08 LAB — T4, FREE: Free T4: 0.58 ng/dL — ABNORMAL LOW (ref 0.60–1.60)

## 2021-03-08 LAB — TSH: TSH: 0.78 u[IU]/mL (ref 0.35–4.50)

## 2021-03-08 LAB — IRON: Iron: 69 ug/dL (ref 42–145)

## 2021-03-11 LAB — VITAMIN B1: Vitamin B1 (Thiamine): 18 nmol/L (ref 8–30)

## 2021-03-11 LAB — VITAMIN D 1,25 DIHYDROXY
Vitamin D 1, 25 (OH)2 Total: 51 pg/mL (ref 18–72)
Vitamin D2 1, 25 (OH)2: <8 pg/mL
Vitamin D3 1, 25 (OH)2: 51 pg/mL

## 2021-03-23 ENCOUNTER — Other Ambulatory Visit: Payer: Self-pay

## 2021-03-23 ENCOUNTER — Ambulatory Visit (INDEPENDENT_AMBULATORY_CARE_PROVIDER_SITE_OTHER): Payer: Federal, State, Local not specified - PPO | Admitting: Psychiatry

## 2021-03-23 DIAGNOSIS — F4310 Post-traumatic stress disorder, unspecified: Secondary | ICD-10-CM | POA: Diagnosis not present

## 2021-03-23 NOTE — Progress Notes (Signed)
      [facility] Counselor/Therapist Progress Note  Patient ID: DORTHA NEIGHBORS, MRN: [id number],    Date: 03/23/2021  Time Spent: 66 minutes start time 9:07 AM end time 10:13 AM  Treatment Type: Individual Therapy  Reported Symptoms: anxiety, triggered responses, panic, flashbacks, health issues, nightmares, fatigue crying spells  Mental Status Exam:  Appearance:   Well Groomed     Behavior:  Appropriate  Motor:  Normal  Speech/Language:   Normal Rate  Affect:  Appropriate tearful  Mood:  anxious sad  Thought process:  normal  Thought content:    WNL  Sensory/Perceptual disturbances:    WNL  Orientation:  oriented to person, place, time/date, and situation  Attention:  Good  Concentration:  Good  Memory:  WNL  Fund of knowledge:   Good  Insight:    Good  Judgment:   Good  Impulse Control:  Good   Risk Assessment: Danger to Self:  No Self-injurious Behavior: No Danger to Others: No Duty to Warn:no Physical Aggression / Violence:No  Access to Firearms a concern: No  Gang Involvement:No   Subjective: Patient was present for session.  She shared she was worked in for an appointment due to a situation with her PCP's office.  She shared that she had been having nose bleeds and spitting up blood.  She was finally seen in the office and they referred her to an ENT. She shared that there are lots of issues with her immune system. The whole situation caused flashbacks with her dad. Youngest som will be having reconstructive shoulder surgery and he is having lots of anxiety over the situation.  Patient reported the most difficult thing is she is been having nightmares about a friend of hers from a few years ago.  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  Patient did EMDR set on the nightmare of her friend being panicked, suds level 10, negative cognition "I am not safe" felt panic  all over.  Patient was able to get suds level to 4.  Could not complete due to running out of time.  Patient reported feeling better at the end of session.  She agreed to work on journaling and trying to focus on her self-care.  Interventions: Solution-Oriented/Positive Psychology, Eye Movement Desensitization and Reprocessing (EMDR), and Insight-Oriented  Diagnosis:   ICD-10-CM   1. Post traumatic stress disorder  F43.10       Plan: Patient is to use CBT and coping skills to decrease triggered responses.  Patient is to follow-up with ENT and primary care for provider to help deal with the bleeding and multiple areas of her body.  Patient is to try to use self talk to keep herself at a good place.  Patient is to exercise and journal as she can.  Patient is to continue using visualization as a safe place exercises.  Patient is to take medication as directed   Lina Sayre, [facility]

## 2021-03-26 DIAGNOSIS — M329 Systemic lupus erythematosus, unspecified: Secondary | ICD-10-CM | POA: Diagnosis not present

## 2021-03-26 DIAGNOSIS — H16223 Keratoconjunctivitis sicca, not specified as Sjogren's, bilateral: Secondary | ICD-10-CM | POA: Diagnosis not present

## 2021-03-26 DIAGNOSIS — H5713 Ocular pain, bilateral: Secondary | ICD-10-CM | POA: Diagnosis not present

## 2021-03-30 ENCOUNTER — Telehealth: Payer: Self-pay | Admitting: Psychiatry

## 2021-03-30 ENCOUNTER — Ambulatory Visit (INDEPENDENT_AMBULATORY_CARE_PROVIDER_SITE_OTHER): Payer: Federal, State, Local not specified - PPO | Admitting: Psychiatry

## 2021-03-30 ENCOUNTER — Encounter: Payer: Self-pay | Admitting: Psychiatry

## 2021-03-30 DIAGNOSIS — F4310 Post-traumatic stress disorder, unspecified: Secondary | ICD-10-CM | POA: Diagnosis not present

## 2021-03-30 NOTE — Progress Notes (Signed)
[facility] Counselor/Therapist Progress Note  Patient ID: Sheila [name], MRN: [id number],    Date: 03/30/2021  Time Spent: 52 minutes start time 9:03 AM end time 9:55 AM Virtual Visit via Telephone Note Connected with patient by a video enabled telemedicine/telehealth application, with their informed consent, and verified patient privacy and that I am speaking with the correct person using two identifiers. I discussed the limitations, risks, security and privacy concerns of performing psychotherapy and management service by telephone and the availability of in person appointments. I also discussed with the patient that there may be a patient responsible charge related to this service. The patient expressed understanding and agreed to proceed. I discussed the treatment planning with the patient. The patient was provided an opportunity to ask questions and all were answered. The patient agreed with the plan and demonstrated an understanding of the instructions. The patient was advised to call  our office if  symptoms worsen or feel they are in a crisis state and need immediate contact.   Therapist Location: office Patient Location: home    Treatment Type: Individual Therapy  Reported Symptoms: anxiety, sleep issues, health issues, panic, triggered responses, sadness,fatigue, muscle tension  Mental Status Exam:  Appearance:   Casual and Neat     Behavior:  Appropriate  Motor:  Normal  Speech/Language:   Normal Rate  Affect:  Appropriate  Mood:  anxious  Thought process:  normal  Thought content:    WNL  Sensory/Perceptual disturbances:    WNL  Orientation:  oriented to person, place, time/date, and situation  Attention:  Good  Concentration:  Good  Memory:  WNL  Fund of knowledge:   Good  Insight:    Good  Judgment:   Good  Impulse Control:  Good   Risk Assessment: Danger to Self:  No Self-injurious Behavior: No Danger to Others: No Duty to Warn:no Physical Aggression  / Violence:No  Access to Firearms a concern: No  Gang Involvement:No   Subjective: Met with patient via virtual session. Patient shared that her son made it through his surgery but she is having to stay with him through the recovery. She shared she is not sleeping currently and that has been hard.  She was able to go to the eye doctor and get some drops to help for the pain in her eyes but she continues to have the other health issues including the extreme nose bleeds.  She decided not to contact the friend from her nightmare that was worked on at last session.  Patient was yawning through the session and shared that her fatigue and insomnia was very overwhelming to her.  Reviewed what was going on with her physically.  She recognizes that her labs from June 2022 showed some issues with her thyroid.  Since that time though she has been having issues with her Crohn's and her intestinal cystitis as well as intense nosebleeds.  Patient shared she has been trying to get with physicians to find out if her other issues are impacting her insomnia.  Agreed to send messages to her provider at practice Thayer Headings, PMH, NP about her insomnia and the bleeding issues that she is currently having.  Patient was informed that someone would contact her once that took place.  Patient was encouraged to remember grounding exercises like counting backwards from 100 by twos and threes, visualizing her safe place, visualizing the park that she and her dad used to go to, and working on diaphragmatic breathing  exercises.  She was also encouraged to write down all the things that work out or have worked out to read when she starts feeling overwhelmed.  She is also to do a brain at night to put all the things that are running in her head on paper to see if that helps.  Interventions: Solution-Oriented/Positive Psychology and grounding/relaxation exercises  Diagnosis:   ICD-10-CM   1. Post traumatic stress disorder  F43.10        Plan: Patient is to use CBT and coping skills to decrease triggered responses.  Patient is to work on grounding exercises and diaphragmatic breathing exercises.  Patient is to write out things that have worked out to read when she starts feeling overwhelmed.  Patient is to do a brain before bedtime.  Patient's provider Thayer Headings, PMH, NP will be contacted concerning patient sleep issues and medical concerns.  Patient is to take medication as directed. Long-term goal: Recall the traumatic event without becoming overwhelmed with negative emotions Short-term goal: Practice implement relaxation training as a coping mechanism for tension panic stress anger and anxiety  Lina Sayre, [facility]

## 2021-03-30 NOTE — Telephone Encounter (Signed)
Let patient know that Thayer Headings PMHNP recommended that she try to see PCP for her health issues. Patient is also to be put on the wait list to try and get her in ASAP to discuss medication to help her sleep.

## 2021-03-30 NOTE — Telephone Encounter (Signed)
Ms. Sheila, [name] are scheduled for a virtual visit with your provider today.    Just as we do with appointments in the office, we must obtain your consent to participate.  Your consent will be active for this visit and any virtual visit you may have with one of our providers in the next 365 days.    If you have a MyChart account, I can also send a copy of this consent to you electronically.  All virtual visits are billed to your insurance company just like a traditional visit in the office.  As this is a virtual visit, video technology does not allow for your provider to perform a traditional examination.  This may limit your provider's ability to fully assess your condition.  If your provider identifies any concerns that need to be evaluated in person or the need to arrange testing such as labs, EKG, etc, we will make arrangements to do so.    Although advances in technology are sophisticated, we cannot ensure that it will always work on either your end or our end.  If the connection with a video visit is poor, we may have to switch to a telephone visit.  With either a video or telephone visit, we are not always able to ensure that we have a secure connection.   I need to obtain your verbal consent now.   Are you willing to proceed with your visit today?   Sheila [name] has provided verbal consent on 03/30/2021 for a virtual visit (video or telephone).   Lina Sayre, [facility] 03/30/2021  9:04 AM

## 2021-04-02 DIAGNOSIS — H16223 Keratoconjunctivitis sicca, not specified as Sjogren's, bilateral: Secondary | ICD-10-CM | POA: Diagnosis not present

## 2021-04-02 DIAGNOSIS — H16142 Punctate keratitis, left eye: Secondary | ICD-10-CM | POA: Diagnosis not present

## 2021-04-05 ENCOUNTER — Ambulatory Visit (INDEPENDENT_AMBULATORY_CARE_PROVIDER_SITE_OTHER): Payer: Federal, State, Local not specified - PPO | Admitting: Psychiatry

## 2021-04-05 ENCOUNTER — Other Ambulatory Visit: Payer: Self-pay

## 2021-04-05 ENCOUNTER — Encounter: Payer: Self-pay | Admitting: Psychiatry

## 2021-04-05 DIAGNOSIS — F4310 Post-traumatic stress disorder, unspecified: Secondary | ICD-10-CM

## 2021-04-05 DIAGNOSIS — F410 Panic disorder [episodic paroxysmal anxiety] without agoraphobia: Secondary | ICD-10-CM

## 2021-04-05 DIAGNOSIS — G4700 Insomnia, unspecified: Secondary | ICD-10-CM

## 2021-04-05 MED ORDER — DESVENLAFAXINE SUCCINATE ER 25 MG PO TB24: ORAL_TABLET | ORAL | 0 refills | Status: DC

## 2021-04-05 MED ORDER — DAYVIGO 5 MG PO TABS: 5.0000 mg | ORAL_TABLET | Freq: Every day | ORAL | 0 refills | Status: DC

## 2021-04-05 NOTE — Patient Instructions (Addendum)
Start Viibryd 5 mg (1/2 of the pink tablet) in the morning for one week, then increase to 10 mg (one of the pink tablets or 1/2 of the orange 20 mg tablets) in the morning with food.  Decrease Pristiq to 25 mg daily for one week (while taking Viibryd 5 mg), then stop.

## 2021-04-06 MED ORDER — VIIBRYD STARTER PACK 10 & 20 MG PO KIT: PACK | ORAL | 0 refills | Status: DC

## 2021-04-06 NOTE — Progress Notes (Signed)
[name] [id number] 1966-05-04 55 y.o.  Subjective:   Patient ID:  Sheila [name] is a 55 y.o. (DOB 08/30/1966) female.  Chief Complaint:  Chief Complaint  Patient presents with   Anxiety   Insomnia     HPI [name] presents to the office today for follow-up of anxiety and insomnia. She reports that she has not been sleeping well. She has been having "bad dreams." She had a dream that she was sleeping and husband was at work and there were multiple people with shadowed out faces around her shaking her and her bed telling her "it's time for you to go." She had another dream where an old friend that she lost touch with was in distress. She reports that she often wakes up with being unable to recall details of dreams and has a sense that she is being "called." She reports frequent panic attacks and worry. She reports getting "more frustrated because I can't control the panic and anxiety." Denies depressed mood. She reports that she is withdrawn socially. She reports that she has anxiety in large gatherings. She reports poor concentration and family comments on her not being able to focus. She reports "there's part of me that wants to get away." Denies SI.   She reports that she went for blood work due to severe fatigue and severe nose bleeds. She has also been coughing up blood. She reports severe fatigue.   Son had reconstructive shoulder surgery a week ago and this caused her anxiety. Oldest son leaves for college next month.   Past Psychiatric Medication Trials: Pristiq- Has seemed to help "take the edge off." Unable to tolerate 75 mg due to n/v Savella Cymbalta- nausea Effexor XR Lexapro Sertraline Prozac Buspar Wellbutrin XL Xanax- Now needing to take 0.5 mg TID Topamax- takes for migraine prevention Gabapentin Trazodone- Helpful for sleep initiation. Ambien   PHQ2-9    [place] Office Visit from 05/18/2020 in [facility] Visit from 08/27/2016 in [facility] [place]  PHQ-2 Total Score 2 0  PHQ-9 Total Score 15 --        Review of Systems:  Review of Systems  Constitutional:  Positive for fatigue.  Respiratory:         Hemoptysis   Musculoskeletal:  Negative for gait problem.  Hematological:  Bruises/bleeds easily.       Nose bleeds  Psychiatric/Behavioral:         Please refer to HPI   Medications: I have reviewed the patient's current medications.  Current Outpatient Medications  Medication Sig Dispense Refill   ALPRAZolam (XANAX) 0.5 MG tablet TAKE 1 TABLET(0.5 MG) BY MOUTH THREE TIMES DAILY AS NEEDED (Patient taking differently: Take 0.5 mg by mouth 3 (three) times daily as needed for anxiety.) 90 tablet 0   Cyanocobalamin (VITAMIN B-12) 5000 MCG TBDP Take by mouth.     Lemborexant (DAYVIGO) 5 MG TABS Take 5 mg by mouth at bedtime. Can increase to 10 mg po QHS after 3-5 days if 5 mg is ineffective 10 tablet 0   prednisoLONE acetate (PRED FORTE) 1 % ophthalmic suspension SMARTSIG:In Eye(s)     tiZANidine (ZANAFLEX) 4 MG tablet TAKE 1 TABLET BY MOUTH TWICE DAILY AS NEEDED 60 tablet 1   topiramate (TOPAMAX) 100 MG tablet TAKE 1 TABLET(100 MG) BY MOUTH DAILY 30 tablet 2   traZODone (DESYREL) 50 MG tablet 100 mg.     Vilazodone  HCl (VIIBRYD STARTER PACK) 10 & 20 MG KIT Take 5 mg by mouth daily at 12 noon for 7 days, THEN 10 mg daily at 12 noon for 28 days. 2 kit 0   Desvenlafaxine Succinate ER (PRISTIQ) 25 MG TB24 Take one tablet daily for one week, then stop. 7 tablet 0   olopatadine (PATANOL) 0.1 % ophthalmic solution Place 1 drop into both eyes 2 (two) times daily. (Patient not taking: Reported on 04/05/2021) 5 mL 12   No current facility-administered medications for this visit.    Medication Side Effects: None  Allergies:  Allergies  Allergen Reactions   Ampicillin    Cymbalta [Duloxetine Hcl] Nausea Only    Constipation, bloating    Doxycycline Diarrhea   Fentanyl Citrate Nausea And Vomiting    fever   Penicillins     Past Medical History:  Diagnosis Date   Anal fissure    Anxiety    Asthma    uses daily   Chronic interstitial cystitis    Crohn disease ([place])    Fibromyalgia    Migraines    Pneumonia 2007   SLE (systemic lupus erythematosus) ([place])     Past Medical History, Surgical history, Social history, and Family history were reviewed and updated as appropriate.   Please see review of systems for further details on the patient's review from today.   Objective:   Physical Exam:  There were no vitals taken for this visit.  Physical Exam Constitutional:      General: She is not in acute distress. Musculoskeletal:        General: No deformity.  Neurological:     Mental Status: She is alert and oriented to person, place, and time.     Coordination: Coordination normal.  Psychiatric:        Attention and Perception: Attention and perception normal. She does not perceive auditory or visual hallucinations.        Mood and Affect: Mood is anxious. Mood is not depressed. Affect is not labile, blunt, angry or inappropriate.        Speech: Speech normal.        Behavior: Behavior normal.        Thought Content: Thought content normal. Thought content is not paranoid or delusional. Thought content does not include homicidal or suicidal ideation. Thought content does not include homicidal or suicidal plan.        Cognition and Memory: Cognition and memory normal.        Judgment: Judgment normal.     Comments: Insight intact    Lab Review:     Component Value Date/Time   NA 138 03/07/2021 1613   K 3.9 03/07/2021 1613   CL 103 03/07/2021 1613   CO2 26 03/07/2021 1613   GLUCOSE 91 03/07/2021 1613   BUN 14 03/07/2021 1613   CREATININE 0.96 03/07/2021 1613   CREATININE 0.95 05/18/2020 0922   CALCIUM 9.9 03/07/2021 1613   PROT 8.3 03/07/2021 1613   ALBUMIN 4.7 03/07/2021 1613   AST 18 03/07/2021  1613   ALT 8 03/07/2021 1613   ALKPHOS 121 (H) 03/07/2021 1613   BILITOT 0.6 03/07/2021 1613   GFRNONAA >60 01/20/2020 1123   GFRAA >60 01/20/2020 1123       Component Value Date/Time   WBC 8.5 03/07/2021 1613   RBC 4.57 03/07/2021 1613   HGB 13.9 03/07/2021 1613   HCT 40.7 03/07/2021 1613   PLT 324.0 03/07/2021 1613   MCV 89.0 03/07/2021 1613  MCH 29.8 05/18/2020 0922   MCHC 34.2 03/07/2021 1613   RDW 13.9 03/07/2021 1613   LYMPHSABS 2.8 03/07/2021 1613   MONOABS 0.7 03/07/2021 1613   EOSABS 0.2 03/07/2021 1613   BASOSABS 0.1 03/07/2021 1613    No results found for: POCLITH, LITHIUM   No results found for: PHENYTOIN, PHENOBARB, VALPROATE, CBMZ   .res Assessment: Plan:    Patient seen for 30 minutes and time spent counseling the patient regarding treatment options for anxiety and insomnia.  Discussed that she continues to have significant anxiety on Pristiq 50 mg daily and has been unable to tolerate higher doses.  Discussed potential benefits, risks, and side effects of Viibryd.  Patient agrees to trial of Viibryd.  Will start Viibryd 5 mg daily in the morning with food for 1 week, then increase to 10 mg daily in the morning with food. Discussed potential benefits, risks, and and side effects of Dayvigo for insomnia.  Patient agrees to trial of Dayvigo.  Patient provided with Dayvigo 5 mg voucher and advised to take 1 tablet by mouth at bedtime, and to increase to 2 tabs after 3 to 5 days if 5 mg dose is ineffective.  Patient advised to contact office if Dayvigo is effective and to request a script for the dose that is effective.  Continue Xanax as needed for severe anxiety and panic. Advised patient to follow-up with PCP as soon as possible regarding continued hemoptysis, nosebleeds, and worsening fatigue.  She reports that she prefers to avoid the ER due to her being immune compromised and risking possible exposure to other health issues. Recommend continuing psychotherapy  with Lina Sayre, [facility]. Patient advised to contact office with any questions, adverse effects, or acute worsening in signs and symptoms.    Sheila [name] was seen today for anxiety and insomnia.  Diagnoses and all orders for this visit:  Insomnia, unspecified type -     Lemborexant (DAYVIGO) 5 MG TABS; Take 5 mg by mouth at bedtime. Can increase to 10 mg po QHS after 3-5 days if 5 mg is ineffective  Post traumatic stress disorder -     Desvenlafaxine Succinate ER (PRISTIQ) 25 MG TB24; Take one tablet daily for one week, then stop. -     Vilazodone HCl (VIIBRYD STARTER PACK) 10 & 20 MG KIT; Take 5 mg by mouth daily at 12 noon for 7 days, THEN 10 mg daily at 12 noon for 28 days.  Severe anxiety with panic -     Desvenlafaxine Succinate ER (PRISTIQ) 25 MG TB24; Take one tablet daily for one week, then stop. -     Vilazodone HCl (VIIBRYD STARTER PACK) 10 & 20 MG KIT; Take 5 mg by mouth daily at 12 noon for 7 days, THEN 10 mg daily at 12 noon for 28 days.    Please see After Visit Summary for patient specific instructions.  Future Appointments  Date Time Provider [place]  04/10/2021  9:00 AM Lina Sayre, [facility] CP-CP None  04/23/2021  1:15 PM Rozetta Nunnery, MD ENT-CN None  04/24/2021 10:00 AM Lina Sayre, [facility] CP-CP None  05/01/2021  9:00 AM Thayer Headings, PMHNP CP-CP None  05/08/2021  9:00 AM Lina Sayre, [facility] CP-CP None    No orders of the defined types were placed in this encounter.   -------------------------------

## 2021-04-10 ENCOUNTER — Ambulatory Visit (INDEPENDENT_AMBULATORY_CARE_PROVIDER_SITE_OTHER): Payer: Federal, State, Local not specified - PPO | Admitting: Psychiatry

## 2021-04-10 ENCOUNTER — Other Ambulatory Visit: Payer: Self-pay

## 2021-04-10 DIAGNOSIS — F4310 Post-traumatic stress disorder, unspecified: Secondary | ICD-10-CM

## 2021-04-10 NOTE — Progress Notes (Signed)
[facility] Counselor/Therapist Progress Note  Patient ID: Sheila [name], MRN: [id number],    Date: 04/10/2021  Time Spent: 52 minutes start time 9:07 AM end time 9:59 AM  Treatment Type: Individual Therapy  Reported Symptoms: anxiety, sadness, triggered responses, nightmares, crying spells, physical issues, panic, fatigue  Mental Status Exam:  Appearance:   Well Groomed     Behavior:  Appropriate  Motor:  Normal  Speech/Language:   Normal Rate  Affect:  Appropriate and Tearful  Mood:  anxious and sad  Thought process:  normal  Thought content:    WNL  Sensory/Perceptual disturbances:    WNL  Orientation:  oriented to person, place, time/date, and situation  Attention:  Good  Concentration:  Good  Memory:  WNL  Fund of knowledge:   Good  Insight:    Good  Judgment:   Good  Impulse Control:  Good   Risk Assessment: Danger to Self:  No Self-injurious Behavior: No Danger to Others: No Duty to Warn:no Physical Aggression / Violence:No  Access to Firearms a concern: No  Gang Involvement:No   Subjective: Patient was present for session.  Patient reported she has been struggling due to all of the financial burdens that her family is currently experiencing with having to pay off medical bills and sending her son to college.  Patient shared that her husband let her know that she may have to stop coming to treatment at this time due to finances.  Patient stated they are still working on things and would let clinician know as things are worked through.  Patient shared she continues to spit up blood and have intense nosebleeds.  Patient stated she has not called her PCP currently due to money issues.  Encouraged patient to realize that they have probably met their family out-of-pocket maximum and so she needs to get everything taken care of with her health before January 1 when things started over.  Encouraged her to call her PCP since her medical issues are definitely a priority  at this time.  Agreed that she could put treatment on hold with clinician if needed but she could not ignore her medical issues.  Patient did EMDR set on nightmare she had, picture shadows shaking the bed, suds level 10, negative cognition "I am not safe" felt fear in her entire body.  Patient was able to reduce suds level to 6.  Through the processing she was able to think of some things that have worked out positively that she did not think would work out and that was calming to her.  She also shared she had a connection with her father through the processing which she also felt positive about.  Patient did not want to try and get suds level any lower at this time due to fear of it possibly increasing again.  Encourage patient to remember her grounding exercises, take care of herself, and call clinician if needed.  Interventions: Solution-Oriented/Positive Psychology, Eye Movement Desensitization and Reprocessing (EMDR), and Insight-Oriented  Diagnosis:   ICD-10-CM   1. Post traumatic stress disorder  F43.10       Plan: Patient is to use CBT and coping skills to decrease triggered responses.  Patient is to contact PCP and deal with the bleeding issues.  Patient is to continue working with her husband on finances.  Patient is to take medication as directed. Long-term goal: Recall the traumatic event without becoming overwhelmed with negative emotions Short-term goal: Practice implement relaxation training as  a coping mechanism for tension panic stress anger and anxiety    Lina Sayre, [facility]

## 2021-04-23 ENCOUNTER — Other Ambulatory Visit: Payer: Self-pay

## 2021-04-23 ENCOUNTER — Ambulatory Visit (INDEPENDENT_AMBULATORY_CARE_PROVIDER_SITE_OTHER): Payer: Federal, State, Local not specified - PPO | Admitting: Otolaryngology

## 2021-04-23 DIAGNOSIS — R040 Epistaxis: Secondary | ICD-10-CM

## 2021-04-23 NOTE — Progress Notes (Signed)
HPI: Sheila [name] is a 55 y.o. female who presents is referred by her PCP for evaluation of recurrent right-sided epistaxis that she has had for over 2 years.  She never has bleeding from the left side always bleeds from the right side.  She apparently had a nosebleed yesterday and had 2 other nosebleeds earlier this week.  She is previously seen another ENT that did a scan of her head that was apparently clear. She has several autoimmune diseases. She does not take any blood thinners..  Past Medical History:  Diagnosis Date   Anal fissure    Anxiety    Asthma    uses daily   Chronic interstitial cystitis    Crohn disease ([place])    Fibromyalgia    Migraines    Pneumonia November 26, 2005   SLE (systemic lupus erythematosus) ([place])    Past Surgical History:  Procedure Laterality Date   BLADDER SURGERY     CESAREAN SECTION     CYSTOSCOPY     KNEE ARTHROSCOPY     LAPAROSCOPIC VAGINAL HYSTERECTOMY WITH SALPINGO OOPHORECTOMY Bilateral 01/24/2020   Procedure: LAPAROSCOPIC ASSISTED VAGINAL HYSTERECTOMY WITH BILATERAL OOPHORECTOMY AND LEFT  SALPINGECTOMY;  Surgeon: Arvella Nigh, MD;  Location: [facility];  Service: Gynecology;  Laterality: Bilateral;  need bed   SALPINGECTOMY Right    right   TONSILLECTOMY     WRIST SURGERY     Social History   Socioeconomic History   Marital status: Married    Spouse name: Not on file   Number of children: 2   Years of education: Not on file   Highest education level: Not on file  Occupational History   Occupation: Preschool teacher  Tobacco Use   Smoking status: Never   Smokeless tobacco: Never  Vaping Use   Vaping Use: Never used  Substance and Sexual Activity   Alcohol use: No    Alcohol/week: 0.0 standard drinks   Drug use: No   Sexual activity: Yes  Other Topics Concern   Not on file  Social History Narrative   Not on file   Social Determinants of Health   Financial Resource Strain: Not on file  Food Insecurity: Not on file   Transportation Needs: Not on file  Physical Activity: Not on file  Stress: Not on file  Social Connections: Not on file   Family History  Problem Relation Age of Onset   Breast cancer Mother    Lung cancer Father        Died in November 27, 2011   Heart disease Father    ADD / ADHD Son    Breast cancer Maternal Grandmother    Ovarian cancer Maternal Grandmother    Breast cancer Maternal Aunt    Prostate cancer Maternal Grandfather    Colon cancer Maternal Grandfather    Irritable bowel syndrome Sister    Ovarian cancer Maternal Aunt    Colon polyps Neg Hx    Esophageal cancer Neg Hx    Kidney disease Neg Hx    Gallbladder disease Neg Hx    Rectal cancer Neg Hx    Stomach cancer Neg Hx    Allergies  Allergen Reactions   Ampicillin    Cymbalta [Duloxetine Hcl] Nausea Only    Constipation, bloating   Doxycycline Diarrhea   Fentanyl Citrate Nausea And Vomiting    fever   Penicillins    Prior to Admission medications   Medication Sig Start Date End Date Taking? Authorizing Provider  ALPRAZolam Duanne Moron) 0.5  MG tablet TAKE 1 TABLET(0.5 MG) BY MOUTH THREE TIMES DAILY AS NEEDED Patient taking differently: Take 0.5 mg by mouth 3 (three) times daily as needed for anxiety. 01/21/18   Ann Held, DO  Cyanocobalamin (VITAMIN B-12) 5000 MCG TBDP Take by mouth.    [provider]  Desvenlafaxine Succinate ER (PRISTIQ) 25 MG TB24 Take one tablet daily for one week, then stop. 04/05/21   Thayer Headings, PMHNP  Lemborexant (DAYVIGO) 5 MG TABS Take 5 mg by mouth at bedtime. Can increase to 10 mg po QHS after 3-5 days if 5 mg is ineffective 04/05/21   Thayer Headings, PMHNP  olopatadine (PATANOL) 0.1 % ophthalmic solution Place 1 drop into both eyes 2 (two) times daily. Patient not taking: Reported on 04/05/2021 03/07/21   Saguier, Percell Miller, PA-C  prednisoLONE acetate (PRED FORTE) 1 % ophthalmic suspension SMARTSIG:In Eye(s) 03/26/21   [provider]  tiZANidine (ZANAFLEX) 4 MG  tablet TAKE 1 TABLET BY MOUTH TWICE DAILY AS NEEDED 02/25/20   Carollee Herter, Alferd Apa, DO  topiramate (TOPAMAX) 100 MG tablet TAKE 1 TABLET(100 MG) BY MOUTH DAILY 10/04/20   Ann Held, DO  traZODone (DESYREL) 50 MG tablet 100 mg. 02/15/20   [provider]  Vilazodone HCl (VIIBRYD STARTER PACK) 10 & 20 MG KIT Take 5 mg by mouth daily at 12 noon for 7 days, THEN 10 mg daily at 12 noon for 28 days. 04/06/21 05/11/21  Thayer Headings, PMHNP     Positive ROS: Otherwise negative  All other systems have been reviewed and were otherwise negative with the exception of those mentioned in the HPI and as above.  Physical Exam: Constitutional: Alert, well-appearing, no acute distress Ears: External ears without lesions or tenderness. Ear canals are clear bilaterally with intact, clear TMs.  Nasal: External nose without lesions. Septum with minimal deformity..  She has a prominent vessel anteriorly inferiorly along the septum on the right side suspect is the etiology of her recurrent nosebleeds.  This was cauterized in the office today using silver nitrate.  Remaining nasal cavity was clear with no abnormal masses noted. Oral: Lips and gums without lesions. Tongue and palate mucosa without lesions. Posterior oropharynx clear. Neck: No palpable adenopathy or masses Respiratory: Breathing comfortably  Skin: No facial/neck lesions or rash noted.  Control of epistaxis  Date/Time: 04/23/2021 1:27 PM Performed by: Rozetta Nunnery, MD Authorized by: Rozetta Nunnery, MD   Consent:    Consent obtained:  Verbal   Consent given by:  Patient Procedure details:    Treatment site:  R anterior   Treatment method:  Silver nitrate   Treatment complexity:  Limited   Treatment episode: initial   Comments:     A right anterior inferior septal vessel was cauterized in the office today using silver nitrate.  Assessment: Recurrent right-sided epistaxis from anterior inferior septal  vessel.  Plan: This was cauterized in the office today using silver nitrate. Reviewed with her concerning if she has any further nosebleeds how to control the nosebleed with a cottonball and Afrin packing anteriorly with pressure and leaning her head forward. She will notify us if she has any further nosebleeds.   Radene Journey, MD   CC:

## 2021-04-24 ENCOUNTER — Ambulatory Visit: Payer: Federal, State, Local not specified - PPO | Admitting: Psychiatry

## 2021-05-01 ENCOUNTER — Ambulatory Visit: Payer: Federal, State, Local not specified - PPO | Admitting: Psychiatry

## 2021-05-08 ENCOUNTER — Other Ambulatory Visit: Payer: Self-pay

## 2021-05-08 ENCOUNTER — Ambulatory Visit: Payer: Federal, State, Local not specified - PPO | Admitting: Psychiatry

## 2021-05-08 DIAGNOSIS — F4310 Post-traumatic stress disorder, unspecified: Secondary | ICD-10-CM

## 2021-05-08 NOTE — Progress Notes (Signed)
[facility] Counselor/Therapist Progress Note  Patient ID: Sheila [name], MRN: [id number],    Date: 05/08/2021  Time Spent: 57 minutes start time 9:06 AM end time 10:03 AM  Treatment Type: Individual Therapy  Reported Symptoms: fatigue, chronic pain, anxiety, sadness, crying spells, triggered responses  Mental Status Exam:  Appearance:   Casual and Neat     Behavior:  Appropriate  Motor:  Normal  Speech/Language:   Normal Rate  Affect:  Appropriate  Mood:  anxious and sad  Thought process:  normal  Thought content:    WNL  Sensory/Perceptual disturbances:    WNL  Orientation:  oriented to person, place, time/date, and situation  Attention:  Good  Concentration:  Good  Memory:  WNL  Fund of knowledge:   Good  Insight:    Good  Judgment:   Good  Impulse Control:  Good   Risk Assessment: Danger to Self:  No Self-injurious Behavior: No Danger to Others: No Duty to Warn:no Physical Aggression / Violence:No  Access to Firearms a concern: No  Gang Involvement:No   Subjective: Patient was present for session.  She shared that she did get to the doctor for the bleeding in her nose and he tried to fix it but the nose bleeds returned.  She has not been able to get with her PCP yet.  She shared she had to focus on getting her son off to school which was a difficult process. He is still struggling which has been difficult for patient.  Patient went on to share that her son got a tattoo of her dad's number and that was very touching to her.  Discussed the fact that she had wanted to be sure her sons always remembered him and that made it clear that they have.  She went on to share the whole situation has been very stressful overall and hard for her to figure out how to manage at times.  Patient explained that she is struggling with sleep and nightmares.  Patient was able to report that her son going to school reminded her of the time when she went off to college and did not know  where her father was because her mother had kicked him out of the house.  Patient stated that they sold the house while she was away and all of her stuff that was left at the home went with it.  Patient was given time to process what happened and was encouraged to recognize that she has been able to overcome all of those things.  Patient was encouraged to start writing out what she has been able to overcome and different times when things have worked out in positive ways even when she could not see it coming.  Patient was able to think of several different things that had worked out well and agreed to focus on those things when she starts feeling very down and overwhelmed.  Patient also agreed to continue working with providers on her medical issues.  Patient is to journal to release emotions appropriately.  Patient was reminded of grounding techniques to use as needed also.  Patient reported that she would not be able to come in for regular sessions at this time due to finances but would contact clinician when it is possible.  Interventions: Cognitive Behavioral Therapy, Solution-Oriented/Positive Psychology, and Insight-Oriented  Diagnosis:   ICD-10-CM   1. Post traumatic stress disorder  F43.10       Plan: Patient is to use  CBT and coping skills to decrease triggered responses.  Patient is to write out things that have worked out in a positive manner to read when she starts getting overwhelmed.  Patient is to use CBT filters and remind herself of the truth/facts.  Patient is to continue trying to exercise to release negative emotions appropriately as well as journaling.  Patient is to take medication as directed.  Patient is to work with providers to deal with medical issues. Long-term goal: Recall the traumatic event without becoming overwhelmed with negative emotions Short-term goal: Practice implement relaxation training as a coping mechanism for tension panic stress anger and anxiety  Lina Sayre, [facility]

## 2021-05-16 DIAGNOSIS — Z7689 Persons encountering health services in other specified circumstances: Secondary | ICD-10-CM | POA: Diagnosis not present

## 2021-05-16 DIAGNOSIS — Z01419 Encounter for gynecological examination (general) (routine) without abnormal findings: Secondary | ICD-10-CM | POA: Diagnosis not present

## 2021-05-16 DIAGNOSIS — Z1231 Encounter for screening mammogram for malignant neoplasm of breast: Secondary | ICD-10-CM | POA: Diagnosis not present

## 2021-05-16 DIAGNOSIS — Z6823 Body mass index (BMI) 23.0-23.9, adult: Secondary | ICD-10-CM | POA: Diagnosis not present

## 2021-05-28 DIAGNOSIS — Z13820 Encounter for screening for osteoporosis: Secondary | ICD-10-CM | POA: Diagnosis not present

## 2021-05-28 DIAGNOSIS — M8580 Other specified disorders of bone density and structure, unspecified site: Secondary | ICD-10-CM | POA: Diagnosis not present

## 2021-07-03 DIAGNOSIS — K582 Mixed irritable bowel syndrome: Secondary | ICD-10-CM | POA: Diagnosis not present

## 2021-07-03 DIAGNOSIS — N3010 Interstitial cystitis (chronic) without hematuria: Secondary | ICD-10-CM | POA: Diagnosis not present

## 2021-07-03 DIAGNOSIS — M797 Fibromyalgia: Secondary | ICD-10-CM | POA: Diagnosis not present

## 2021-07-03 DIAGNOSIS — N9489 Other specified conditions associated with female genital organs and menstrual cycle: Secondary | ICD-10-CM | POA: Diagnosis not present

## 2021-09-18 ENCOUNTER — Encounter: Payer: Self-pay | Admitting: Family

## 2021-09-18 ENCOUNTER — Telehealth (INDEPENDENT_AMBULATORY_CARE_PROVIDER_SITE_OTHER): Payer: Federal, State, Local not specified - PPO | Admitting: Family

## 2021-09-18 VITALS — Temp 98.5°F | Ht 64.0 in | Wt 135.0 lb

## 2021-09-18 DIAGNOSIS — J019 Acute sinusitis, unspecified: Secondary | ICD-10-CM | POA: Diagnosis not present

## 2021-09-18 MED ORDER — AZITHROMYCIN 250 MG PO TABS: ORAL_TABLET | ORAL | 0 refills | Status: DC

## 2021-09-18 NOTE — Progress Notes (Signed)
Sheila [name] is a 56 y.o. female with the following history as recorded in Epic[employer]:  Patient Active Problem List   Diagnosis Date Noted   COVID-19 02/09/2021   Dyslipidemia 05/18/2020   Severe anxiety with panic 05/18/2020   Post traumatic stress disorder 05/18/2020   Severe episode of recurrent major depressive disorder, without psychotic features ([place]) 05/18/2020   Alpha-1-antitrypsin deficiency ([place]) 05/18/2020   Abnormal vaginal bleeding 01/24/2020   S/P laparoscopic assisted vaginal hysterectomy ([facility]) 01/24/2020   Chronic fatigue 01/05/2020   Epistaxis 11/19/2019   Dry eyes 09/08/2019   Mild persistent asthma without complication 99/24/2683   Hoarseness, persistent 02/27/2018   Reflux laryngitis 02/27/2018   Rectal pain 03/05/2017   H/O Crohn's disease 03/05/2017   Bloating 03/05/2017   Migraine without aura and without status migrainosus, not intractable 08/27/2016   Depression with anxiety 08/04/2016   Fibromyalgia 08/04/2016   Migraines 07/11/2015   Rash and nonspecific skin eruption 04/13/2015   Memory loss of unknown cause 04/13/2015   External hemorrhoid 02/25/2013   Left knee pain 05/04/2012   Anxiety state 11/26/2010   DEPRESSIVE DISORDER 01/11/2009   ABDOMINAL BLOATING 08/19/2008   CROHN'S DISEASE 06/29/2008   LYMPHOPENIA 11/19/2007   HYPERTHYROIDISM 11/05/2007   CERVICAL LYMPHADENOPATHY 11/03/2007   CHRONIC INTERSTITIAL CYSTITIS 08/04/2007   LUPUS ERYTHEMATOSUS 08/04/2007   Fibromyalgia muscle pain 08/04/2007   Chest pain 08/04/2007   HEMATURIA, HX OF 08/04/2007    Current Outpatient Medications  Medication Sig Dispense Refill   ALPRAZolam (XANAX) 0.5 MG tablet TAKE 1 TABLET(0.5 MG) BY MOUTH THREE TIMES DAILY AS NEEDED (Patient taking differently: Take 0.5 mg by mouth 3 (three) times daily as needed for anxiety.) 90 tablet 0   azithromycin (ZITHROMAX) 250 MG tablet 2 tabs po qd x 1 day; 1 tablet per day x 4 days; 6 tablet 0   Cyanocobalamin (VITAMIN  B-12) 5000 MCG TBDP Take by mouth.     tiZANidine (ZANAFLEX) 4 MG tablet TAKE 1 TABLET BY MOUTH TWICE DAILY AS NEEDED 60 tablet 1   topiramate (TOPAMAX) 100 MG tablet TAKE 1 TABLET(100 MG) BY MOUTH DAILY 30 tablet 2   traZODone (DESYREL) 50 MG tablet 100 mg.     Desvenlafaxine Succinate ER (PRISTIQ) 25 MG TB24 Take one tablet daily for one week, then stop. (Patient not taking: Reported on 09/18/2021) 7 tablet 0   Lemborexant (DAYVIGO) 5 MG TABS Take 5 mg by mouth at bedtime. Can increase to 10 mg po QHS after 3-5 days if 5 mg is ineffective (Patient not taking: Reported on 09/18/2021) 10 tablet 0   olopatadine (PATANOL) 0.1 % ophthalmic solution Place 1 drop into both eyes 2 (two) times daily. (Patient not taking: Reported on 04/05/2021) 5 mL 12   prednisoLONE acetate (PRED FORTE) 1 % ophthalmic suspension SMARTSIG:In Eye(s) (Patient not taking: Reported on 09/18/2021)     Vilazodone HCl (VIIBRYD STARTER PACK) 10 & 20 MG KIT Take 5 mg by mouth daily at 12 noon for 7 days, THEN 10 mg daily at 12 noon for 28 days. 2 kit 0   No current facility-administered medications for this visit.    Allergies: Ampicillin, Cymbalta [duloxetine hcl], Doxycycline, Fentanyl citrate, and Penicillins  Past Medical History:  Diagnosis Date   Anal fissure    Anxiety    Asthma    uses daily   Chronic interstitial cystitis    Crohn disease ([place])    Fibromyalgia    Migraines    Pneumonia 2007   SLE (systemic  lupus erythematosus) ([place])     Past Surgical History:  Procedure Laterality Date   BLADDER SURGERY     CESAREAN SECTION     CYSTOSCOPY     KNEE ARTHROSCOPY     LAPAROSCOPIC VAGINAL HYSTERECTOMY WITH SALPINGO OOPHORECTOMY Bilateral 01/24/2020   Procedure: LAPAROSCOPIC ASSISTED VAGINAL HYSTERECTOMY WITH BILATERAL OOPHORECTOMY AND LEFT  SALPINGECTOMY;  Surgeon: Arvella Nigh, MD;  Location: [facility];  Service: Gynecology;  Laterality: Bilateral;  need bed   SALPINGECTOMY Right    right    TONSILLECTOMY     WRIST SURGERY      Family History  Problem Relation Age of Onset   Breast cancer Mother    Lung cancer Father        Died in 26-Nov-2011   Heart disease Father    ADD / ADHD Son    Breast cancer Maternal Grandmother    Ovarian cancer Maternal Grandmother    Breast cancer Maternal Aunt    Prostate cancer Maternal Grandfather    Colon cancer Maternal Grandfather    Irritable bowel syndrome Sister    Ovarian cancer Maternal Aunt    Colon polyps Neg Hx    Esophageal cancer Neg Hx    Kidney disease Neg Hx    Gallbladder disease Neg Hx    Rectal cancer Neg Hx    Stomach cancer Neg Hx     Social History   Tobacco Use   Smoking status: Never   Smokeless tobacco: Never  Substance Use Topics   Alcohol use: No    Alcohol/week: 0.0 standard drinks    Subjective:   I connected with Werner Lean on 09/18/21 at 11:20 AM EST by a video enabled telemedicine application and verified that I am speaking with the correct person using two identifiers.   I discussed the limitations of evaluation and management by telemedicine and the availability of in person appointments. The patient expressed understanding and agreed to proceed. Provider in office/ patient is at home; provider and patient are only 2 people on video call.   4 day history of sinus pain/ pressure; +facial pain/ pressure; + right ear pain;  Negative COVID test; no chest pain or shortness of breath;    Objective:  Vitals:   09/18/21 1117  Temp: 98.5 F (36.9 C)  TempSrc: Temporal  Weight: 135 lb (61.2 kg)  Height: 5' 4"  (1.626 m)    General: Well developed, well nourished, in no acute distress  Skin : Warm and dry.  Head: Normocephalic and atraumatic  Lungs: Respirations unlabored;  Neurologic: Alert and oriented; speech intact; face symmetrical;   1. Acute sinusitis, recurrence not specified, unspecified location     Plan:  Rx for Z-pak; symptomatic treatment discussed; increase fluids, rest and  follow up worse, no better.    No follow-ups on file.  No orders of the defined types were placed in this encounter.   Requested Prescriptions   Signed Prescriptions Disp Refills   azithromycin (ZITHROMAX) 250 MG tablet 6 tablet 0    Sig: 2 tabs po qd x 1 day; 1 tablet per day x 4 days;

## 2021-09-24 ENCOUNTER — Encounter: Payer: Self-pay | Admitting: Family Medicine

## 2021-09-24 MED ORDER — LEVOFLOXACIN 500 MG PO TABS: 500.0000 mg | ORAL_TABLET | Freq: Every day | ORAL | 0 refills | Status: AC

## 2021-09-24 NOTE — Telephone Encounter (Signed)
Pt seen on 09/18/21 with Mickel Baas. Please advise

## 2021-10-03 ENCOUNTER — Ambulatory Visit (INDEPENDENT_AMBULATORY_CARE_PROVIDER_SITE_OTHER): Payer: Federal, State, Local not specified - PPO | Admitting: Psychiatry

## 2021-10-03 DIAGNOSIS — F4310 Post-traumatic stress disorder, unspecified: Secondary | ICD-10-CM | POA: Diagnosis not present

## 2021-10-03 NOTE — Progress Notes (Signed)
[facility] Counselor/Therapist Progress Note  Patient ID: Sheila [name], MRN: [id number],    Date: 10/03/2021  Time Spent: 47 minutes start time 3:00 PM end time 3:47 PM Virtual Visit via Video Note Connected with patient by a telemedicine/telehealth application, with their informed consent, and verified patient privacy and that I am speaking with the correct person using two identifiers. I discussed the limitations, risks, security and privacy concerns of performing psychotherapy and the availability of in person appointments. I also discussed with the patient that there may be a patient responsible charge related to this service. The patient expressed understanding and agreed to proceed. I discussed the treatment planning with the patient. The patient was provided an opportunity to ask questions and all were answered. The patient agreed with the plan and demonstrated an understanding of the instructions. The patient was advised to call  our office if  symptoms worsen or feel they are in a crisis state and need immediate contact.   Therapist Location: home Patient Location: home    Treatment Type: Individual Therapy  Reported Symptoms: anxiety, sleep issues, triggered responses, flashbacks, sadness, panic, health issues  Mental Status Exam:  Appearance:   Well Groomed     Behavior:  Appropriate  Motor:  Normal  Speech/Language:   Normal Rate  Affect:  Appropriate  Mood:  anxious  Thought process:  normal  Thought content:    WNL  Sensory/Perceptual disturbances:    WNL  Orientation:  oriented to person, place, time/date, and situation  Attention:  Good  Concentration:  Good  Memory:  WNL  Fund of knowledge:   Good  Insight:    Good  Judgment:   Good  Impulse Control:  Good   Risk Assessment: Danger to Self:  No Self-injurious Behavior: No Danger to Others: No Duty to Warn:no Physical Aggression / Violence:No  Access to Firearms a concern: No  Gang Involvement:No    Subjective: Met with patient via virtual session. She shared that there have been difficult times over the past few months.  Patient stated the hardest situation has been her son's roommate has posted things on line that were concerning and patient has had to deal with the different situations.  She shared that she did communicate with his parents about her concerns but they did not seem as concerned as she was.  Finally she and her husband had to discuss having to change roommates with their son who did not want to address anything with his roommate.  Patient went on to share she is still concerned and wanting to talk with the college about the situation.  Helped patient process through what she felt that she had to do to feel that she had done everything she could in case something bad did happen.  Patient decided through the processing that she did want to go talk to somebody in administration at the college and share the 12 different screen shots that she has from what was posted on social media.  Patient was encouraged to recognize that when she does this she needs to visualize handing over the situation to the person at the college since there is nothing else that she can do in the matter.  Patient was also encouraged to work on her breathing and grounding herself regularly during this difficult situation.  Also discussed trying to use some brain spotting bilateral music to keep herself calmer when she is just at home as well as some of  the relaxation exercises.  Patient agreed to contact clinician if there were any other concerns.  Interventions: Cognitive Behavioral Therapy and Solution-Oriented/Positive Psychology  Diagnosis:   ICD-10-CM   1. Post traumatic stress disorder  F43.10       Plan: Patient is to use CBT and coping skills to decrease triggered responses.  Patient is to work on brain spotting bilateral music and relaxation exercises to work on calming herself.  Patient is to work with  providers on health issues and continue taking medication as directed. Long-term goal: Recall the traumatic event without becoming overwhelmed with negative emotions Short-term goal: Practice implement relaxation training as a coping mechanism for tension panic stress anger and anxiety    Lina Sayre, [facility]

## 2021-10-29 DIAGNOSIS — L304 Erythema intertrigo: Secondary | ICD-10-CM | POA: Diagnosis not present

## 2021-10-29 DIAGNOSIS — Z1283 Encounter for screening for malignant neoplasm of skin: Secondary | ICD-10-CM | POA: Diagnosis not present

## 2021-10-29 DIAGNOSIS — D225 Melanocytic nevi of trunk: Secondary | ICD-10-CM | POA: Diagnosis not present

## 2021-10-29 DIAGNOSIS — L820 Inflamed seborrheic keratosis: Secondary | ICD-10-CM | POA: Diagnosis not present

## 2021-11-26 ENCOUNTER — Encounter: Payer: Self-pay | Admitting: Gastroenterology

## 2021-12-03 DIAGNOSIS — H16223 Keratoconjunctivitis sicca, not specified as Sjogren's, bilateral: Secondary | ICD-10-CM | POA: Diagnosis not present

## 2021-12-03 DIAGNOSIS — H5713 Ocular pain, bilateral: Secondary | ICD-10-CM | POA: Diagnosis not present

## 2021-12-03 DIAGNOSIS — H16143 Punctate keratitis, bilateral: Secondary | ICD-10-CM | POA: Diagnosis not present

## 2021-12-03 DIAGNOSIS — M329 Systemic lupus erythematosus, unspecified: Secondary | ICD-10-CM | POA: Diagnosis not present

## 2021-12-13 ENCOUNTER — Encounter: Payer: Self-pay | Admitting: Gastroenterology

## 2021-12-17 DIAGNOSIS — H16142 Punctate keratitis, left eye: Secondary | ICD-10-CM | POA: Diagnosis not present

## 2021-12-17 DIAGNOSIS — H5713 Ocular pain, bilateral: Secondary | ICD-10-CM | POA: Diagnosis not present

## 2021-12-17 DIAGNOSIS — H16223 Keratoconjunctivitis sicca, not specified as Sjogren's, bilateral: Secondary | ICD-10-CM | POA: Diagnosis not present

## 2021-12-18 ENCOUNTER — Ambulatory Visit: Payer: Federal, State, Local not specified - PPO | Admitting: Psychiatry

## 2022-01-01 DIAGNOSIS — R300 Dysuria: Secondary | ICD-10-CM | POA: Diagnosis not present

## 2022-01-01 DIAGNOSIS — N941 Unspecified dyspareunia: Secondary | ICD-10-CM | POA: Diagnosis not present

## 2022-01-01 DIAGNOSIS — N3010 Interstitial cystitis (chronic) without hematuria: Secondary | ICD-10-CM | POA: Diagnosis not present

## 2022-01-01 DIAGNOSIS — G43709 Chronic migraine without aura, not intractable, without status migrainosus: Secondary | ICD-10-CM | POA: Diagnosis not present

## 2022-01-04 ENCOUNTER — Ambulatory Visit: Payer: Federal, State, Local not specified - PPO | Admitting: Family Medicine

## 2022-01-04 ENCOUNTER — Ambulatory Visit (HOSPITAL_BASED_OUTPATIENT_CLINIC_OR_DEPARTMENT_OTHER)
Admission: RE | Admit: 2022-01-04 | Discharge: 2022-01-04 | Disposition: A | Discharge status: Home / Self Care | Payer: Federal, State, Local not specified - PPO | Source: Ambulatory Visit | Attending: Family Medicine | Admitting: Family Medicine

## 2022-01-04 ENCOUNTER — Encounter: Payer: Self-pay | Admitting: Family Medicine

## 2022-01-04 VITALS — BP 100/60 | HR 74 | Temp 98.0°F | Resp 16 | Ht 64.0 in | Wt 128.0 lb

## 2022-01-04 DIAGNOSIS — J40 Bronchitis, not specified as acute or chronic: Secondary | ICD-10-CM | POA: Insufficient documentation

## 2022-01-04 DIAGNOSIS — R059 Cough, unspecified: Secondary | ICD-10-CM | POA: Diagnosis not present

## 2022-01-04 MED ORDER — PREDNISONE 10 MG PO TABS: ORAL_TABLET | ORAL | 0 refills | Status: DC

## 2022-01-04 MED ORDER — LEVOFLOXACIN 500 MG PO TABS: 500.0000 mg | ORAL_TABLET | Freq: Every day | ORAL | 0 refills | Status: AC

## 2022-01-04 MED ORDER — METHYLPREDNISOLONE ACETATE 80 MG/ML IJ SUSP
80.0000 mg | Freq: Once | INTRAMUSCULAR | Status: AC
  Administered 2022-01-04: 80 mg via INTRAMUSCULAR

## 2022-01-04 MED ORDER — PROMETHAZINE-DM 6.25-15 MG/5ML PO SYRP: 5.0000 mL | ORAL_SOLUTION | Freq: Four times a day (QID) | ORAL | 0 refills | Status: DC | PRN

## 2022-01-04 NOTE — Patient Instructions (Signed)
Acute Bronchitis, Adult ? ?Acute bronchitis is sudden inflammation of the main airways (bronchi) that come off the windpipe (trachea) in the lungs. The swelling causes the airways to get smaller and make more mucus than normal. This can make it hard to breathe and can cause coughing or noisy breathing (wheezing). ?Acute bronchitis may last several weeks. The cough may last longer. Allergies, asthma, and exposure to smoke may make the condition worse. ?What are the causes? ?This condition can be caused by germs and by substances that irritate the lungs, including: ?Cold and flu viruses. The most common cause of this condition is the virus that causes the common cold. ?Bacteria. This is less common. ?Breathing in substances that irritate the lungs, including: ?Smoke from cigarettes and other forms of tobacco. ?Dust and pollen. ?Fumes from household cleaning products, gases, or burned fuel. ?Indoor or outdoor air pollution. ?What increases the risk? ?The following factors may make you more likely to develop this condition: ?A weak body's defense system, also called the immune system. ?A condition that affects your lungs and breathing, such as asthma. ?What are the signs or symptoms? ?Common symptoms of this condition include: ?Coughing. This may bring up clear, yellow, or green mucus from your lungs (sputum). ?Wheezing. ?Runny or stuffy nose. ?Having too much mucus in your lungs (chest congestion). ?Shortness of breath. ?Aches and pains, including sore throat or chest. ?How is this diagnosed? ?This condition is usually diagnosed based on: ?Your symptoms and medical history. ?A physical exam. ?You may also have other tests, including tests to rule out other conditions, such as pneumonia. These tests include: ?A test of lung function. ?Test of a mucus sample to look for the presence of bacteria. ?Tests to check the oxygen level in your blood. ?Blood tests. ?Chest X-ray. ?How is this treated? ?Most cases of acute  bronchitis clear up over time without treatment. Your health care provider may recommend: ?Drinking more fluids to help thin your mucus so it is easier to cough up. ?Taking inhaled medicine (inhaler) to improve air flow in and out of your lungs. ?Using a vaporizer or a humidifier. These are machines that add water to the air to help you breathe better. ?Taking a medicine that thins mucus and clears congestion (expectorant). ?Taking a medicine that prevents or stops coughing (cough suppressant). ?It is notcommon to take an antibiotic medicine for this condition. ?Follow these instructions at home: ? ?Take over-the-counter and prescription medicines only as told by your health care provider. ?Use an inhaler, vaporizer, or humidifier as told by your health care provider. ?Take two teaspoons (10 mL) of honey at bedtime to lessen coughing at night. ?Drink enough fluid to keep your urine pale yellow. ?Do not use any products that contain nicotine or tobacco. These products include cigarettes, chewing tobacco, and vaping devices, such as e-cigarettes. If you need help quitting, ask your health care provider. ?Get plenty of rest. ?Return to your normal activities as told by your health care provider. Ask your health care provider what activities are safe for you. ?Keep all follow-up visits. This is important. ?How is this prevented? ?To lower your risk of getting this condition again: ?Wash your hands often with soap and water for at least 20 seconds. If soap and water are not available, use hand sanitizer. ?Avoid contact with people who have cold symptoms. ?Try not to touch your mouth, nose, or eyes with your hands. ?Avoid breathing in smoke or chemical fumes. Breathing smoke or chemical fumes will make your   condition worse. ?Get the flu shot every year. ?Contact a health care provider if: ?Your symptoms do not improve after 2 weeks. ?You have trouble coughing up the mucus. ?Your cough keeps you awake at night. ?You have a  fever. ?Get help right away if you: ?Cough up blood. ?Feel pain in your chest. ?Have severe shortness of breath. ?Faint or keep feeling like you are going to faint. ?Have a severe headache. ?Have a fever or chills that get worse. ?These symptoms may represent a serious problem that is an emergency. Do not wait to see if the symptoms will go away. Get medical help right away. Call your local emergency services (911 in the [place] Do not drive yourself to the hospital. ?Summary ?Acute bronchitis is inflammation of the main airways (bronchi) that come off the windpipe (trachea) in the lungs. The swelling causes the airways to get smaller and make more mucus than normal. ?Drinking more fluids can help thin your mucus so it is easier to cough up. ?Take over-the-counter and prescription medicines only as told by your health care provider. ?Do not use any products that contain nicotine or tobacco. These products include cigarettes, chewing tobacco, and vaping devices, such as e-cigarettes. If you need help quitting, ask your health care provider. ?Contact a health care provider if your symptoms do not improve after 2 weeks. ?This information is not intended to replace advice given to you by your health care provider. Make sure you discuss any questions you have with your health care provider. ?Document Revised: 01/03/2021 Document Reviewed: 01/03/2021 ?Elsevier Patient Education ? 2023 Elsevier Inc. ? ?

## 2022-01-04 NOTE — Progress Notes (Signed)
? ?Subjective:  ? ?By signing my name below, I, Zite Okoli, attest that this documentation has been prepared under the direction and in the presence of Ann Held, DO. 01/04/2022   ? ? Patient ID: Sheila [name], female    DOB: 03-Feb-1966, 55 y.o.   MRN: [id number] ? ?Chief Complaint  ?Patient presents with  ? Cough  ?  Here for Cough and congestion  ? ? ?HPI ?Patient is in today for an office visit. ? ?She reports she has been sick for the past 10 days. She had a fever of 101 for 4 of those days. The symptoms started while she was at the beach and she started dayquil and nyquil. She took a COVID-19 test and it was negative. Symptoms include congestion, wheezing, productive cough, chest tightness and pressure. Notes she can't take deep breaths and it is very hard to breath. Nyquil helps her to get little sleep. She would like medication to help her breathe and sleep. ? ? ? ?Past Medical History:  ?Diagnosis Date  ? Anal fissure   ? Anxiety   ? Asthma   ? uses daily  ? Chronic interstitial cystitis   ? Crohn disease ([place])   ? Fibromyalgia   ? Migraines   ? Pneumonia 21-Nov-2005  ? SLE (systemic lupus erythematosus) ([place])   ? ? ?Past Surgical History:  ?Procedure Laterality Date  ? BLADDER SURGERY    ? CESAREAN SECTION    ? CYSTOSCOPY    ? KNEE ARTHROSCOPY    ? LAPAROSCOPIC VAGINAL HYSTERECTOMY WITH SALPINGO OOPHORECTOMY Bilateral 01/24/2020  ? Procedure: LAPAROSCOPIC ASSISTED VAGINAL HYSTERECTOMY WITH BILATERAL OOPHORECTOMY AND LEFT  SALPINGECTOMY;  Surgeon: Arvella Nigh, MD;  Location: [facility];  Service: Gynecology;  Laterality: Bilateral;  need bed  ? SALPINGECTOMY Right   ? right  ? TONSILLECTOMY    ? WRIST SURGERY    ? ? ?Family History  ?Problem Relation Age of Onset  ? Breast cancer Mother   ? Lung cancer Father   ?     Died in 11/22/2011  ? Heart disease Father   ? ADD / ADHD Son   ? Breast cancer Maternal Grandmother   ? Ovarian cancer Maternal Grandmother   ? Breast cancer Maternal Aunt   ?  Prostate cancer Maternal Grandfather   ? Colon cancer Maternal Grandfather   ? Irritable bowel syndrome Sister   ? Ovarian cancer Maternal Aunt   ? Colon polyps Neg Hx   ? Esophageal cancer Neg Hx   ? Kidney disease Neg Hx   ? Gallbladder disease Neg Hx   ? Rectal cancer Neg Hx   ? Stomach cancer Neg Hx   ? ? ?Social History  ? ?Socioeconomic History  ? Marital status: Married  ?  Spouse name: Not on file  ? Number of children: 2  ? Years of education: Not on file  ? Highest education level: Not on file  ?Occupational History  ? Occupation: Print production planner  ?Tobacco Use  ? Smoking status: Never  ? Smokeless tobacco: Never  ?Vaping Use  ? Vaping Use: Never used  ?Substance and Sexual Activity  ? Alcohol use: No  ?  Alcohol/week: 0.0 standard drinks  ? Drug use: No  ? Sexual activity: Yes  ?Other Topics Concern  ? Not on file  ?Social History Narrative  ? Not on file  ? ?Social Determinants of Health  ? ?Financial Resource Strain: Not on file  ?Food Insecurity: Not on  file  ?Transportation Needs: Not on file  ?Physical Activity: Not on file  ?Stress: Not on file  ?Social Connections: Not on file  ?Intimate Partner Violence: Not on file  ? ? ?Outpatient Medications Prior to Visit  ?Medication Sig Dispense Refill  ? ALPRAZolam (XANAX) 0.5 MG tablet TAKE 1 TABLET(0.5 MG) BY MOUTH THREE TIMES DAILY AS NEEDED (Patient taking differently: Take 0.5 mg by mouth 3 (three) times daily as needed for anxiety.) 90 tablet 0  ? azithromycin (ZITHROMAX) 250 MG tablet 2 tabs po qd x 1 day; 1 tablet per day x 4 days; 6 tablet 0  ? Cyanocobalamin (VITAMIN B-12) 5000 MCG TBDP Take by mouth.    ? tiZANidine (ZANAFLEX) 4 MG tablet TAKE 1 TABLET BY MOUTH TWICE DAILY AS NEEDED 60 tablet 1  ? topiramate (TOPAMAX) 100 MG tablet TAKE 1 TABLET(100 MG) BY MOUTH DAILY 30 tablet 2  ? traZODone (DESYREL) 50 MG tablet 100 mg.    ? Vilazodone HCl (VIIBRYD STARTER PACK) 10 & 20 MG KIT Take 5 mg by mouth daily at 12 noon for 7 days, THEN 10 mg daily  at 12 noon for 28 days. 2 kit 0  ? Desvenlafaxine Succinate ER (PRISTIQ) 25 MG TB24 Take one tablet daily for one week, then stop. (Patient not taking: Reported on 09/18/2021) 7 tablet 0  ? Lemborexant (DAYVIGO) 5 MG TABS Take 5 mg by mouth at bedtime. Can increase to 10 mg po QHS after 3-5 days if 5 mg is ineffective (Patient not taking: Reported on 09/18/2021) 10 tablet 0  ? olopatadine (PATANOL) 0.1 % ophthalmic solution Place 1 drop into both eyes 2 (two) times daily. (Patient not taking: Reported on 04/05/2021) 5 mL 12  ? prednisoLONE acetate (PRED FORTE) 1 % ophthalmic suspension SMARTSIG:In Eye(s) (Patient not taking: Reported on 09/18/2021)    ? ?No facility-administered medications prior to visit.  ? ? ?Allergies  ?Allergen Reactions  ? Ampicillin   ? Cymbalta [Duloxetine Hcl] Nausea Only  ?  Constipation, bloating  ? Doxycycline Diarrhea  ? Fentanyl Citrate Nausea And Vomiting  ?  fever  ? Penicillins   ? ? ?Review of Systems  ?Constitutional:  Negative for fever.  ?HENT:  Positive for congestion. Negative for ear pain, hearing loss, sinus pain and sore throat.   ?Eyes:  Negative for blurred vision and pain.  ?Respiratory:  Positive for cough, sputum production, shortness of breath and wheezing.   ?Cardiovascular:  Positive for chest pain. Negative for palpitations.  ?Gastrointestinal:  Negative for blood in stool, constipation, diarrhea, nausea and vomiting.  ?Genitourinary:  Negative for dysuria, frequency, hematuria and urgency.  ?Musculoskeletal:  Negative for back pain, falls and myalgias.  ?Neurological:  Negative for dizziness, sensory change, loss of consciousness, weakness and headaches.  ?Endo/Heme/Allergies:  Negative for environmental allergies. Does not bruise/bleed easily.  ?Psychiatric/Behavioral:  Negative for depression and suicidal ideas. The patient is not nervous/anxious and does not have insomnia.   ? ?   ?Objective:  ?  ?Physical Exam ?Constitutional:   ?   General: She is not in acute  distress. ?   Appearance: Normal appearance. She is not ill-appearing.  ?HENT:  ?   Head: Normocephalic and atraumatic.  ?   Right Ear: External ear normal.  ?   Left Ear: External ear normal.  ?Eyes:  ?   Extraocular Movements: Extraocular movements intact.  ?   Pupils: Pupils are equal, round, and reactive to light.  ?Cardiovascular:  ?   Rate  and Rhythm: Normal rate and regular rhythm.  ?   Pulses: Normal pulses.  ?   Heart sounds: Normal heart sounds. No murmur heard. ?  No gallop.  ?Pulmonary:  ?   Effort: Pulmonary effort is normal. No respiratory distress.  ?   Breath sounds: Wheezing and rhonchi present. No rales.  ?Abdominal:  ?   General: Bowel sounds are normal. There is no distension.  ?   Palpations: Abdomen is soft. There is no mass.  ?   Tenderness: There is no abdominal tenderness. There is no guarding or rebound.  ?   Hernia: No hernia is present.  ?Musculoskeletal:  ?   Cervical back: Normal range of motion and neck supple.  ?Lymphadenopathy:  ?   Cervical: No cervical adenopathy.  ?Skin: ?   General: Skin is warm and dry.  ?Neurological:  ?   Mental Status: She is alert and oriented to person, place, and time.  ?Psychiatric:     ?   Behavior: Behavior normal.  ? ? ?BP 100/60 (BP Location: Right Arm, Patient Position: Sitting, Cuff Size: Normal)   Pulse 74   Temp 98 ?F (36.7 ?C) (Oral)   Resp 16   Ht _0  (1.626 m)   Wt 128 lb (58.1 kg)   SpO2 97%   BMI 21.97 kg/m?  ?Wt Readings from Last 3 Encounters:  ?01/04/22 128 lb (58.1 kg)  ?09/18/21 135 lb (61.2 kg)  ?03/07/21 138 lb 6.4 oz (62.8 kg)  ? ? ?Diabetic Foot Exam - Simple   ?No data filed ?  ? ?Lab Results  ?Component Value Date  ? WBC 8.5 03/07/2021  ? HGB 13.9 03/07/2021  ? HCT 40.7 03/07/2021  ? PLT 324.0 03/07/2021  ? GLUCOSE 91 03/07/2021  ? CHOL 249 (H) 08/23/2020  ? TRIG 126.0 08/23/2020  ? HDL 65.60 08/23/2020  ? LDLCALC 158 (H) 08/23/2020  ? ALT 8 03/07/2021  ? AST 18 03/07/2021  ? NA 138 03/07/2021  ? K 3.9 03/07/2021  ? CL 103  03/07/2021  ? CREATININE 0.96 03/07/2021  ? BUN 14 03/07/2021  ? CO2 26 03/07/2021  ? TSH 0.78 03/07/2021  ? ? ?Lab Results  ?Component Value Date  ? TSH 0.78 03/07/2021  ? ?Lab Results  ?Component Value Date  ? WB

## 2022-01-07 DIAGNOSIS — H5713 Ocular pain, bilateral: Secondary | ICD-10-CM | POA: Diagnosis not present

## 2022-01-07 DIAGNOSIS — M329 Systemic lupus erythematosus, unspecified: Secondary | ICD-10-CM | POA: Diagnosis not present

## 2022-01-07 DIAGNOSIS — H16223 Keratoconjunctivitis sicca, not specified as Sjogren's, bilateral: Secondary | ICD-10-CM | POA: Diagnosis not present

## 2022-01-22 ENCOUNTER — Ambulatory Visit (AMBULATORY_SURGERY_CENTER): Payer: Federal, State, Local not specified - PPO | Admitting: *Deleted

## 2022-01-22 VITALS — Ht 64.0 in | Wt 124.0 lb

## 2022-01-22 DIAGNOSIS — Z8719 Personal history of other diseases of the digestive system: Secondary | ICD-10-CM

## 2022-01-22 DIAGNOSIS — Z86010 Personal history of colonic polyps: Secondary | ICD-10-CM

## 2022-01-22 MED ORDER — NA SULFATE-K SULFATE-MG SULF 17.5-3.13-1.6 GM/177ML PO SOLN: 1.0000 | Freq: Once | ORAL | 0 refills | Status: AC

## 2022-01-22 NOTE — Progress Notes (Signed)

## 2022-01-28 ENCOUNTER — Ambulatory Visit: Payer: Federal, State, Local not specified - PPO | Admitting: Family Medicine

## 2022-01-28 ENCOUNTER — Encounter: Payer: Self-pay | Admitting: Family Medicine

## 2022-01-28 ENCOUNTER — Ambulatory Visit (HOSPITAL_BASED_OUTPATIENT_CLINIC_OR_DEPARTMENT_OTHER)
Admission: RE | Admit: 2022-01-28 | Discharge: 2022-01-28 | Disposition: A | Discharge status: Home / Self Care | Payer: Federal, State, Local not specified - PPO | Source: Ambulatory Visit | Attending: Family Medicine | Admitting: Family Medicine

## 2022-01-28 VITALS — BP 100/68 | HR 75 | Temp 98.6°F | Ht 64.0 in | Wt 124.8 lb

## 2022-01-28 DIAGNOSIS — Z1159 Encounter for screening for other viral diseases: Secondary | ICD-10-CM | POA: Diagnosis not present

## 2022-01-28 DIAGNOSIS — M545 Low back pain, unspecified: Secondary | ICD-10-CM | POA: Insufficient documentation

## 2022-01-28 DIAGNOSIS — M25551 Pain in right hip: Secondary | ICD-10-CM | POA: Diagnosis not present

## 2022-01-28 DIAGNOSIS — E538 Deficiency of other specified B group vitamins: Secondary | ICD-10-CM | POA: Diagnosis not present

## 2022-01-28 DIAGNOSIS — Z23 Encounter for immunization: Secondary | ICD-10-CM

## 2022-01-28 DIAGNOSIS — R21 Rash and other nonspecific skin eruption: Secondary | ICD-10-CM | POA: Diagnosis not present

## 2022-01-28 DIAGNOSIS — M25552 Pain in left hip: Secondary | ICD-10-CM

## 2022-01-28 DIAGNOSIS — Z0000 Encounter for general adult medical examination without abnormal findings: Secondary | ICD-10-CM

## 2022-01-28 DIAGNOSIS — F332 Major depressive disorder, recurrent severe without psychotic features: Secondary | ICD-10-CM

## 2022-01-28 DIAGNOSIS — E8801 Alpha-1-antitrypsin deficiency: Secondary | ICD-10-CM

## 2022-01-28 DIAGNOSIS — E0590 Thyrotoxicosis, unspecified without thyrotoxic crisis or storm: Secondary | ICD-10-CM

## 2022-01-28 DIAGNOSIS — K50919 Crohn's disease, unspecified, with unspecified complications: Secondary | ICD-10-CM

## 2022-01-28 DIAGNOSIS — E559 Vitamin D deficiency, unspecified: Secondary | ICD-10-CM

## 2022-01-28 DIAGNOSIS — M329 Systemic lupus erythematosus, unspecified: Secondary | ICD-10-CM

## 2022-01-28 DIAGNOSIS — M797 Fibromyalgia: Secondary | ICD-10-CM

## 2022-01-28 DIAGNOSIS — F411 Generalized anxiety disorder: Secondary | ICD-10-CM

## 2022-01-28 LAB — CBC WITH DIFFERENTIAL/PLATELET
Basophils Absolute: 0.1 10*3/uL (ref 0.0–0.1)
Basophils Relative: 0.8 % (ref 0.0–3.0)
Eosinophils Absolute: 0.1 10*3/uL (ref 0.0–0.7)
Eosinophils Relative: 1.4 % (ref 0.0–5.0)
HCT: 39.6 % (ref 36.0–46.0)
Hemoglobin: 13.3 g/dL (ref 12.0–15.0)
Lymphocytes Relative: 28.8 % (ref 12.0–46.0)
Lymphs Abs: 1.8 10*3/uL (ref 0.7–4.0)
MCHC: 33.5 g/dL (ref 30.0–36.0)
MCV: 91.7 fl (ref 78.0–100.0)
Monocytes Absolute: 0.4 10*3/uL (ref 0.1–1.0)
Monocytes Relative: 6.5 % (ref 3.0–12.0)
Neutro Abs: 4 10*3/uL (ref 1.4–7.7)
Neutrophils Relative %: 62.5 % (ref 43.0–77.0)
Platelets: 237 10*3/uL (ref 150.0–400.0)
RBC: 4.32 Mil/uL (ref 3.87–5.11)
RDW: 14.2 % (ref 11.5–15.5)
WBC: 6.4 10*3/uL (ref 4.0–10.5)

## 2022-01-28 LAB — VITAMIN D 25 HYDROXY (VIT D DEFICIENCY, FRACTURES): VITD: 25.06 ng/mL — ABNORMAL LOW (ref 30.00–100.00)

## 2022-01-28 LAB — POC URINALSYSI DIPSTICK (AUTOMATED)
Blood, UA: NEGATIVE
Glucose, UA: NEGATIVE
Ketones, UA: NEGATIVE
Leukocytes, UA: NEGATIVE
Nitrite, UA: NEGATIVE
Protein, UA: NEGATIVE
Spec Grav, UA: >=1.03 — AB (ref 1.010–1.025)
Urobilinogen, UA: 0.2 E.U./dL
pH, UA: 5 (ref 5.0–8.0)

## 2022-01-28 LAB — TSH: TSH: 1.28 u[IU]/mL (ref 0.35–5.50)

## 2022-01-28 LAB — VITAMIN B12: Vitamin B-12: >1504 pg/mL — ABNORMAL HIGH (ref 211–911)

## 2022-01-28 MED ORDER — TRIAMCINOLONE ACETONIDE 0.1 % EX CREA: 1.0000 "application " | TOPICAL_CREAM | Freq: Two times a day (BID) | CUTANEOUS | 3 refills | Status: DC

## 2022-01-28 NOTE — Assessment & Plan Note (Signed)
Check xray ?con't IB / tylenol  ? ?

## 2022-01-28 NOTE — Addendum Note (Signed)
Addended by: Sanda Linger on: 01/28/2022 11:13 AM ? ? Modules accepted: Orders ? ?

## 2022-01-28 NOTE — Assessment & Plan Note (Signed)
Check xrays ?Suspect pinched nerve  ?

## 2022-01-28 NOTE — Assessment & Plan Note (Signed)
ghm utd Check labs  See avs  

## 2022-01-28 NOTE — Assessment & Plan Note (Signed)
Check labs 

## 2022-01-28 NOTE — Assessment & Plan Note (Signed)
Muscle relaxers  ?

## 2022-01-28 NOTE — Patient Instructions (Signed)

## 2022-01-28 NOTE — Progress Notes (Addendum)
? ?Subjective:  ? ?By signing my name below, I, Carylon Perches, attest that this documentation has been prepared under the direction and in the presence of Roma Schanz DO, 01/28/2022   ? ? Patient ID: Sheila [name], female    DOB: 1966/01/11, 56 y.o.   MRN: [id number] ? ?Chief Complaint  ?Patient presents with  ? Annual Exam  ? ? ?HPI ?Patient is in today for a comprehensive physical exam.  ? ?She complains of back, buttock, hip and side pain. Pain is persistent. She states that it's hard for her to sit down. She states that at times, when she gets up from a chair, she stumbles due to pain radiating down her legs and up her spine. Pain is worse on her left side. She has hip pain in both sides of her body. She struggles to comfortably sit down and lie down on her bed. She has had x-rays in the past for her back pain. She takes 4 MG of Zanaflex when needed. She uses foam rollers when needed.  ? ?She states that her anxiety is worsening. She denies of any depression. She has not been taking the 10 & 20 MG kit of Viibryd Starter Pack. She has a stress ball and does yoga and walking.   ? ?Her Crohn's has been worsening. She is scheduled to see her gastro specialist, Dr. Ardis Hughs, for a colonoscopy on 02/05/2022. She states that she is being mindful of her diet. She does have constipation and frequent blood in the stool.   ? ?She denies having any fever, ear pain, new moles, congestion, sinus pain, sore throat, palpations, wheezing, n/v/d,  dysuria, frequency, hematuria at this time. ? ?Colonoscopy: Last completed on 11/06/2018. She has a colonoscopy scheduled a week for 02/05/2022 ?Immunizations: She is interested in a tetanus vaccine. She is overdue for Shingrix vaccine. She states that she has received 2 COVID vaccines.  ?Diet: She has a healthy diet.  ?Exercise: She is regularly exercising.  ? ? ? ?Past Medical History:  ?Diagnosis Date  ? AAT (alpha-1-antitrypsin) deficiency ([place])   ? Anal fissure   ? Anxiety    ? Asthma   ? uses daily  ? Cancer [facility])   ? per gentic test  ? Chronic interstitial cystitis   ? Crohn disease ([place])   ? Fibromyalgia   ? Migraines   ? Pneumonia 2005/12/06  ? SLE (systemic lupus erythematosus) ([place] [facility])   ? ? ?Past Surgical History:  ?Procedure Laterality Date  ? adnoids    ? BLADDER SURGERY    ? "couple"  ? CESAREAN SECTION    ? CYSTOSCOPY    ? KNEE ARTHROSCOPY    ? "COUPLE"  ? LAPAROSCOPIC VAGINAL HYSTERECTOMY WITH SALPINGO OOPHORECTOMY Bilateral 01/24/2020  ? Procedure: LAPAROSCOPIC ASSISTED VAGINAL HYSTERECTOMY WITH BILATERAL OOPHORECTOMY AND LEFT  SALPINGECTOMY;  Surgeon: Arvella Nigh, MD;  Location: [place];  Service: Gynecology;  Laterality: Bilateral;  need bed  ? SALPINGECTOMY Right   ? right  ? TONSILLECTOMY    ? WRIST SURGERY Left   ? x 2  ? ? ?Family History  ?Problem Relation Age of Onset  ? Breast cancer Mother   ? Lung cancer Father   ?     Died in 12-07-2011  ? Heart disease Father   ? Irritable bowel syndrome Sister   ? Breast cancer Maternal Aunt   ? Ovarian cancer Maternal Aunt   ? Breast cancer Maternal Grandmother   ? Ovarian cancer Maternal Grandmother   ?  Prostate cancer Maternal Grandfather   ? Colon cancer Maternal Grandfather   ? ADD / ADHD Son   ? Colon polyps Neg Hx   ? Esophageal cancer Neg Hx   ? Kidney disease Neg Hx   ? Gallbladder disease Neg Hx   ? Rectal cancer Neg Hx   ? Stomach cancer Neg Hx   ? Crohn's disease Neg Hx   ? ? ?Social History  ? ?Socioeconomic History  ? Marital status: Married  ?  Spouse name: Not on file  ? Number of children: 2  ? Years of education: Not on file  ? Highest education level: Not on file  ?Occupational History  ? Occupation: Print production planner  ?Tobacco Use  ? Smoking status: Never  ? Smokeless tobacco: Never  ?Vaping Use  ? Vaping Use: Never used  ?Substance and Sexual Activity  ? Alcohol use: No  ?  Alcohol/week: 0.0 standard drinks  ? Drug use: No  ? Sexual activity: Yes  ?Other Topics Concern  ? Not on file  ?Social History  Narrative  ? Yoga , walking and mindfulness for exercise and stress relief   ? ?Social Determinants of Health  ? ?Financial Resource Strain: Not on file  ?Food Insecurity: Not on file  ?Transportation Needs: Not on file  ?Physical Activity: Not on file  ?Stress: Not on file  ?Social Connections: Not on file  ?Intimate Partner Violence: Not on file  ? ? ?Outpatient Medications Prior to Visit  ?Medication Sig Dispense Refill  ? ALPRAZolam (XANAX) 0.5 MG tablet TAKE 1 TABLET(0.5 MG) BY MOUTH THREE TIMES DAILY AS NEEDED (Patient taking differently: Take 0.5 mg by mouth 3 (three) times daily as needed for anxiety.) 90 tablet 0  ? Cyanocobalamin (VITAMIN B-12 PO) Take 5,000 mcg by mouth daily.    ? tiZANidine (ZANAFLEX) 4 MG tablet TAKE 1 TABLET BY MOUTH TWICE DAILY AS NEEDED 60 tablet 1  ? topiramate (TOPAMAX) 100 MG tablet TAKE 1 TABLET(100 MG) BY MOUTH DAILY 30 tablet 2  ? traZODone (DESYREL) 50 MG tablet 100 mg.    ? Vilazodone HCl (VIIBRYD STARTER PACK) 10 & 20 MG KIT Take 5 mg by mouth daily at 12 noon for 7 days, THEN 10 mg daily at 12 noon for 28 days. 2 kit 0  ? ?No facility-administered medications prior to visit.  ? ? ?Allergies  ?Allergen Reactions  ? Ampicillin   ? Cymbalta [Duloxetine Hcl] Nausea Only  ?  Constipation, bloating  ? Doxycycline Diarrhea  ? Fentanyl Citrate Nausea And Vomiting  ?  fever  ? Penicillins   ? ? ?Review of Systems  ?Constitutional:  Negative for fever and malaise/fatigue.  ?HENT:  Negative for congestion, sinus pain and sore throat.   ?Eyes:  Negative for blurred vision.  ?Respiratory:  Negative for cough, shortness of breath and wheezing.   ?Cardiovascular:  Negative for chest pain, palpitations and leg swelling.  ?Gastrointestinal:  Negative for blood in stool, constipation, diarrhea, nausea and vomiting.  ?Genitourinary:  Negative for dysuria, frequency and hematuria.  ?Musculoskeletal:  Positive for back pain, joint pain (Buttock and Hip) and myalgias (Side).  ?Skin:  Negative  for rash.  ?     (-) New Moles  ?Neurological:  Negative for loss of consciousness and headaches.  ?Psychiatric/Behavioral:  Negative for depression. The patient is nervous/anxious.   ? ?   ?Objective:  ?  ?Physical Exam ?Constitutional:   ?   General: She is not in acute distress. ?  Appearance: Normal appearance. She is well-developed. She is not ill-appearing.  ?HENT:  ?   Head: Normocephalic and atraumatic.  ?   Right Ear: Tympanic membrane, ear canal and external ear normal.  ?   Left Ear: Tympanic membrane, ear canal and external ear normal.  ?   Nose: Nose normal.  ?Eyes:  ?   Extraocular Movements: Extraocular movements intact.  ?   Pupils: Pupils are equal, round, and reactive to light.  ?Cardiovascular:  ?   Rate and Rhythm: Normal rate and regular rhythm.  ?   Heart sounds: Normal heart sounds. No murmur heard. ?  No gallop.  ?Pulmonary:  ?   Effort: Pulmonary effort is normal. No respiratory distress.  ?   Breath sounds: Normal breath sounds. No wheezing or rales.  ?Chest:  ?   Chest wall: No tenderness.  ?Abdominal:  ?   General: Bowel sounds are normal. There is no distension.  ?   Palpations: Abdomen is soft.  ?   Tenderness: There is no abdominal tenderness. There is no guarding.  ?Musculoskeletal:  ?   Cervical back: Normal range of motion and neck supple.  ?   Comments: 3/5 left lower extremity ?4/5 right lower extremity ?3/5 bilateral knee extension  ?Skin: ?   General: Skin is warm and dry.  ?Neurological:  ?   Mental Status: She is alert and oriented to person, place, and time.  ?Psychiatric:     ?   Mood and Affect: Mood is anxious. Mood is not depressed or elated. Affect is not blunt, angry, tearful or inappropriate.     ?   Behavior: Behavior normal.     ?   Thought Content: Thought content normal.     ?   Judgment: Judgment normal.  ? ? ?BP 100/68 (BP Location: Left Arm, Patient Position: Sitting, Cuff Size: Normal)   Pulse 75   Temp 98.6 ?F (37 ?C) (Oral)   Ht 5' 4"  (1.626 m)   Wt 124  lb 12.8 oz (56.6 kg)   LMP  (LMP Unknown)   SpO2 99%   BMI 21.42 kg/m?  ?Wt Readings from Last 3 Encounters:  ?01/28/22 124 lb 12.8 oz (56.6 kg)  ?01/22/22 124 lb (56.2 kg)  ?01/04/22 128 lb (58.1 kg)  ? ?

## 2022-01-28 NOTE — Assessment & Plan Note (Signed)
Per GI

## 2022-01-28 NOTE — Assessment & Plan Note (Signed)
Worsening but pt does not want more medication ?She will con't prn xanax  ?

## 2022-01-29 LAB — COMPREHENSIVE METABOLIC PANEL
ALT: 9 U/L (ref 0–35)
AST: 14 U/L (ref 0–37)
Albumin: 4.5 g/dL (ref 3.5–5.2)
Alkaline Phosphatase: 82 U/L (ref 39–117)
BUN: 14 mg/dL (ref 6–23)
CO2: 23 mEq/L (ref 19–32)
Calcium: 9.5 mg/dL (ref 8.4–10.5)
Chloride: 107 mEq/L (ref 96–112)
Creatinine, Ser: 0.94 mg/dL (ref 0.40–1.20)
GFR: 69.11 mL/min (ref >60.00)
Glucose, Bld: 94 mg/dL (ref 70–99)
Potassium: 4.2 mEq/L (ref 3.5–5.1)
Sodium: 141 mEq/L (ref 135–145)
Total Bilirubin: 0.5 mg/dL (ref 0.2–1.2)
Total Protein: 7.1 g/dL (ref 6.0–8.3)

## 2022-01-29 LAB — LIPID PANEL
Cholesterol: 256 mg/dL — ABNORMAL HIGH (ref 0–200)
HDL: 75.2 mg/dL (ref >39.00)
LDL Cholesterol: 167 mg/dL — ABNORMAL HIGH (ref 0–99)
NonHDL: 180.89
Total CHOL/HDL Ratio: 3
Triglycerides: 69 mg/dL (ref 0.0–149.0)
VLDL: 13.8 mg/dL (ref 0.0–40.0)

## 2022-01-29 LAB — HEPATITIS C ANTIBODY
Hepatitis C Ab: NONREACTIVE
SIGNAL TO CUT-OFF: 0.06 (ref <1.00)

## 2022-01-30 DIAGNOSIS — M5459 Other low back pain: Secondary | ICD-10-CM | POA: Diagnosis not present

## 2022-01-30 DIAGNOSIS — M5451 Vertebrogenic low back pain: Secondary | ICD-10-CM | POA: Diagnosis not present

## 2022-02-03 DIAGNOSIS — M5451 Vertebrogenic low back pain: Secondary | ICD-10-CM | POA: Diagnosis not present

## 2022-02-05 ENCOUNTER — Encounter: Payer: Self-pay | Admitting: Gastroenterology

## 2022-02-05 ENCOUNTER — Ambulatory Visit (AMBULATORY_SURGERY_CENTER): Payer: Federal, State, Local not specified - PPO | Admitting: Gastroenterology

## 2022-02-05 VITALS — BP 110/66 | HR 66 | Temp 96.8°F | Resp 14 | Ht 64.0 in | Wt 124.0 lb

## 2022-02-05 DIAGNOSIS — D124 Benign neoplasm of descending colon: Secondary | ICD-10-CM

## 2022-02-05 DIAGNOSIS — D125 Benign neoplasm of sigmoid colon: Secondary | ICD-10-CM | POA: Diagnosis not present

## 2022-02-05 DIAGNOSIS — Z86010 Personal history of colonic polyps: Secondary | ICD-10-CM | POA: Diagnosis not present

## 2022-02-05 DIAGNOSIS — Z1211 Encounter for screening for malignant neoplasm of colon: Secondary | ICD-10-CM | POA: Diagnosis not present

## 2022-02-05 DIAGNOSIS — D123 Benign neoplasm of transverse colon: Secondary | ICD-10-CM | POA: Diagnosis not present

## 2022-02-05 MED ORDER — SODIUM CHLORIDE 0.9 % IV SOLN: 500.0000 mL | Freq: Once | INTRAVENOUS | Status: DC

## 2022-02-05 NOTE — Progress Notes (Signed)
Colonoscopy 11-17-18 Dr. Ardis Hughs normal TI, 5 subCM TA, normal colon mucosa. Biopsies from TI and colon were normal.   HPI: This is a womanw with h/o polpys   ROS: complete GI ROS as described in HPI, all other review negative.  Constitutional:  No unintentional weight loss   Past Medical History:  Diagnosis Date   AAT (alpha-1-antitrypsin) deficiency ([facility])    Anal fissure    Anxiety    Asthma    uses daily   Cancer ([place])    per gentic test   Chronic interstitial cystitis    Crohn disease ([place])    Fibromyalgia    Migraines    Pneumonia 2005-11-17   SLE (systemic lupus erythematosus) ([place])     Past Surgical History:  Procedure Laterality Date   adnoids     BLADDER SURGERY     "couple"   CESAREAN SECTION     COLONOSCOPY     CYSTOSCOPY     KNEE ARTHROSCOPY     "COUPLE"   LAPAROSCOPIC VAGINAL HYSTERECTOMY WITH SALPINGO OOPHORECTOMY Bilateral 01/24/2020   Procedure: LAPAROSCOPIC ASSISTED VAGINAL HYSTERECTOMY WITH BILATERAL OOPHORECTOMY AND LEFT  SALPINGECTOMY;  Surgeon: Arvella Nigh, MD;  [facility];  Service: Gynecology;  Laterality: Bilateral;  need bed   SALPINGECTOMY Right    right   TONSILLECTOMY     WRIST SURGERY Left    x 2    Current Outpatient Medications  Medication Sig Dispense Refill   ALPRAZolam (XANAX) 0.5 MG tablet TAKE 1 TABLET(0.5 MG) BY MOUTH THREE TIMES DAILY AS NEEDED (Patient taking differently: Take 0.5 mg by mouth 3 (three) times daily as needed for anxiety.) 90 tablet 0   Cyanocobalamin (VITAMIN B-12 PO) Take 5,000 mcg by mouth daily.     tiZANidine (ZANAFLEX) 4 MG tablet TAKE 1 TABLET BY MOUTH TWICE DAILY AS NEEDED 60 tablet 1   topiramate (TOPAMAX) 100 MG tablet TAKE 1 TABLET(100 MG) BY MOUTH DAILY 30 tablet 2   traZODone (DESYREL) 50 MG tablet 100 mg.     triamcinolone cream (KENALOG) 0.1 % Apply 1 application. topically 2 (two) times daily. 45 g 3   Current Facility-Administered Medications  Medication Dose Route  Frequency Provider Last Rate Last Admin   0.9 %  sodium chloride infusion  500 mL Intravenous Once Milus Banister, MD        Allergies as of 02/05/2022 - Review Complete 02/05/2022  Allergen Reaction Noted   Ampicillin     Cymbalta [duloxetine hcl] Nausea Only 02/13/2017   Doxycycline Diarrhea 10/13/2019   Fentanyl citrate Nausea And Vomiting    Penicillins      Family History  Problem Relation Age of Onset   Breast cancer Mother    Lung cancer Father        Died in 11/18/2011   Heart disease Father    Irritable bowel syndrome Sister    Breast cancer Maternal Aunt    Ovarian cancer Maternal Aunt    Breast cancer Maternal Grandmother    Ovarian cancer Maternal Grandmother    Prostate cancer Maternal Grandfather    Colon cancer Maternal Grandfather    ADD / ADHD Son    Colon polyps Neg Hx    Esophageal cancer Neg Hx    Kidney disease Neg Hx    Gallbladder disease Neg Hx    Rectal cancer Neg Hx    Stomach cancer Neg Hx    Crohn's disease Neg Hx     Social History  Socioeconomic History   Marital status: Married    Spouse name: Not on file   Number of children: 2   Years of education: Not on file   Highest education level: Not on file  Occupational History   Occupation: Preschool teacher  Tobacco Use   Smoking status: Never   Smokeless tobacco: Never  Vaping Use   Vaping Use: Never used  Substance and Sexual Activity   Alcohol use: No    Alcohol/week: 0.0 standard drinks   Drug use: No   Sexual activity: Yes  Other Topics Concern   Not on file  Social History Narrative   Yoga , walking and mindfulness for exercise and stress relief    Social Determinants of Health   Financial Resource Strain: Not on file  Food Insecurity: Not on file  Transportation Needs: Not on file  Physical Activity: Not on file  Stress: Not on file  Social Connections: Not on file  Intimate Partner Violence: Not on file     Physical Exam: BP 106/68   Pulse 92   Temp (!) 96.8  F (36 C) (Temporal)   Ht 5' 4"  (1.626 m)   Wt 124 lb (56.2 kg)   LMP  (LMP Unknown)   SpO2 98%   BMI 21.28 kg/m  Constitutional: generally well-appearing Psychiatric: alert and oriented x3 Lungs: CTA bilaterally Heart: no MCR  Assessment and plan: 56 y.o. female with h/o polyps  Surveillance colonoscopy today  Care is appropriate for the ambulatory setting.  Owens Loffler, MD [facility] 02/05/2022, 9:17 AM

## 2022-02-05 NOTE — Op Note (Signed)
[facility] Patient Name: Sheila [name] Procedure Date: 02/05/2022 9:19 AM MRN: [id number] Endoscopist: Milus Banister , MD Age: 56 Referring MD:  Date of Birth: 1966-09-12 Gender: Female Account #: [id number] Procedure:                Colonoscopy Indications:              High risk colon cancer surveillance: Personal                            history of colonic polyps; Previous Dr. Ane Payment                            patient Colonoscopy 2010 for ? h/o Crohn's disease                            showed normal TI, normal colon. Colonoscopy 2020                            Dr. Ardis Hughs found 5 subCM TAs, normal TI, normal                            colon Medicines:                Monitored Anesthesia Care Procedure:                Pre-Anesthesia Assessment:                           - Prior to the procedure, a History and Physical                            was performed, and patient medications and                            allergies were reviewed. The patient's tolerance of                            previous anesthesia was also reviewed. The risks                            and benefits of the procedure and the sedation                            options and risks were discussed with the patient.                            All questions were answered, and informed consent                            was obtained. Prior Anticoagulants: The patient has                            taken no previous anticoagulant or antiplatelet  agents. ASA Grade Assessment: II - A patient with                            mild systemic disease. After reviewing the risks                            and benefits, the patient was deemed in                            satisfactory condition to undergo the procedure.                           After obtaining informed consent, the colonoscope                            was passed under direct vision. Throughout the                             procedure, the patient's blood pressure, pulse, and                            oxygen saturations were monitored continuously. The                            PCF-HQ190L Colonoscope was introduced through the                            anus and advanced to the the terminal ileum. The                            colonoscopy was performed without difficulty. The                            patient tolerated the procedure well. The quality                            of the bowel preparation was good. The terminal                            ileum, ileocecal valve, appendiceal orifice, and                            rectum were photographed. Scope In: 9:27:02 AM Scope Out: 9:38:54 AM Scope Withdrawal Time: 0 hours 9 minutes 0 seconds  Total Procedure Duration: 0 hours 11 minutes 52 seconds  Findings:                 The terminal ileum appeared normal.                           Five sessile polyps were found in the sigmoid                            colon, descending colon and transverse colon. The  polyps were 2 to 5 mm in size. These polyps were                            removed with a cold snare. Resection and retrieval                            were complete.                           The exam was otherwise without abnormality on                            direct and retroflexion views. Complications:            No immediate complications. Estimated blood loss:                            None. Estimated Blood Loss:     Estimated blood loss: none. Impression:               - The examined portion of the ileum was normal.                           - Five 2 to 5 mm polyps in the sigmoid colon, in                            the descending colon and in the transverse colon,                            removed with a cold snare. Resected and retrieved.                           - The examination was otherwise normal on direct                            and  retroflexion views. Recommendation:           - Patient has a contact number available for                            emergencies. The signs and symptoms of potential                            delayed complications were discussed with the                            patient. Return to normal activities tomorrow.                            Written discharge instructions were provided to the                            patient.                           -  Resume previous diet.                           - Continue present medications.                           - Await pathology results.                           - Dr. Ardis Hughs' office will contact about follow up                            visit in the office to discuss your GI sympomts.                            Note that there have been no signs of IBD/Crohn's                            disease on 2010 colonocopy, 2020 colonoscopy or                            this examination today. Milus Banister, MD 02/05/2022 9:43:29 AM This report has been signed electronically.

## 2022-02-05 NOTE — Progress Notes (Signed)
Report to PACU, RN, vss, BBS= Clear.  

## 2022-02-05 NOTE — Progress Notes (Signed)
VS completed by CW.   Pt's states no medical or surgical changes since previsit or office visit.  

## 2022-02-05 NOTE — Progress Notes (Signed)
Called to room to assist during endoscopic procedure.  Patient ID and intended procedure confirmed with present staff. Received instructions for my participation in the procedure from the performing physician.  

## 2022-02-05 NOTE — Patient Instructions (Signed)
Please read handouts provided. Continue present medications. Await pathology results. Dr. Ardis Hughs' office will contact about a follow-up visit in the office to discuss GI symptoms.   YOU HAD AN ENDOSCOPIC PROCEDURE TODAY AT [facility]:   Refer to the procedure report that was given to you for any specific questions about what was found during the examination.  If the procedure report does not answer your questions, please call your gastroenterologist to clarify.  If you requested that your care partner not be given the details of your procedure findings, then the procedure report has been included in a sealed envelope for you to review at your convenience later.  YOU SHOULD EXPECT: Some feelings of bloating in the abdomen. Passage of more gas than usual.  Walking can help get rid of the air that was put into your GI tract during the procedure and reduce the bloating. If you had a lower endoscopy (such as a colonoscopy or flexible sigmoidoscopy) you may notice spotting of blood in your stool or on the toilet paper. If you underwent a bowel prep for your procedure, you may not have a normal bowel movement for a few days.  Please Note:  You might notice some irritation and congestion in your nose or some drainage.  This is from the oxygen used during your procedure.  There is no need for concern and it should clear up in a day or so.  SYMPTOMS TO REPORT IMMEDIATELY:  Following lower endoscopy (colonoscopy or flexible sigmoidoscopy):  Excessive amounts of blood in the stool  Significant tenderness or worsening of abdominal pains  Swelling of the abdomen that is new, acute  Fever of 100F or higher  For urgent or emergent issues, a gastroenterologist can be reached at any hour by calling [phone]. Do not use MyChart messaging for urgent concerns.    DIET:  We do recommend a small meal at first, but then you may proceed to your regular diet.  Drink plenty of fluids but you  should avoid alcoholic beverages for 24 hours.  ACTIVITY:  You should plan to take it easy for the rest of today and you should NOT DRIVE or use heavy machinery until tomorrow (because of the sedation medicines used during the test).    FOLLOW UP: Our staff will call the number listed on your records 48-72 hours following your procedure to check on you and address any questions or concerns that you may have regarding the information given to you following your procedure. If we do not reach you, we will leave a message.  We will attempt to reach you two times.  During this call, we will ask if you have developed any symptoms of COVID 19. If you develop any symptoms (ie: fever, flu-like symptoms, shortness of breath, cough etc.) before then, please call [phone].  If you test positive for Covid 19 in the 2 weeks post procedure, please call and report this information to [place].    If any biopsies were taken you will be contacted by phone or by letter within the next 1-3 weeks.  Please call us at [phone] if you have not heard about the biopsies in 3 weeks.    SIGNATURES/CONFIDENTIALITY: You and/or your care partner have signed paperwork which will be entered into your electronic medical record.  These signatures attest to the fact that that the information above on your After Visit Summary has been reviewed and is understood.  Full responsibility of the confidentiality of this  discharge information lies with you and/or your care-partner.

## 2022-02-06 ENCOUNTER — Telehealth: Payer: Self-pay | Admitting: *Deleted

## 2022-02-06 ENCOUNTER — Other Ambulatory Visit: Payer: Self-pay

## 2022-02-06 NOTE — Telephone Encounter (Signed)
  Follow up Call-     02/05/2022    8:38 AM  Call back number  Post procedure Call Back phone  # [phone]  Permission to leave phone message Yes     Patient questions:  Do you have a fever, pain , or abdominal swelling? No. Pain Score  0 *  Have you tolerated food without any problems? Yes.    Have you been able to return to your normal activities? Yes.    Do you have any questions about your discharge instructions: Diet   No. Medications  No. Follow up visit  No.  Do you have questions or concerns about your Care? No.  Actions: * If pain score is 4 or above: No action needed, pain <4.

## 2022-02-06 NOTE — Telephone Encounter (Signed)
First follow up call attempt.  LVM. 

## 2022-02-12 ENCOUNTER — Other Ambulatory Visit: Payer: Self-pay

## 2022-02-12 ENCOUNTER — Encounter: Payer: Self-pay | Admitting: Gastroenterology

## 2022-02-12 ENCOUNTER — Telehealth: Payer: Self-pay | Admitting: Family Medicine

## 2022-02-12 ENCOUNTER — Other Ambulatory Visit: Payer: Self-pay | Admitting: Specialist

## 2022-02-12 DIAGNOSIS — M545 Low back pain, unspecified: Secondary | ICD-10-CM

## 2022-02-12 DIAGNOSIS — M5451 Vertebrogenic low back pain: Secondary | ICD-10-CM | POA: Diagnosis not present

## 2022-02-12 NOTE — Progress Notes (Signed)
Patient presents for labwork from [facility]. Place orders in system. Kathrene Alu RN

## 2022-02-12 NOTE — Telephone Encounter (Signed)
Patient received lab orders from [facility] to be completed.. unable to perform labs unless provider puts orders in. Please advise

## 2022-02-12 NOTE — Telephone Encounter (Signed)
Spoke with patient and she stated that she had lab orders from [facility] and they told her to go to her PCP to get drawn.  She spoke with [place] here and was told that we are unable to draw labs here because they were not ordered here and she was transferred to the office down the hall ([id number]) to the [facility] drawing station.  They said they could do it, but while I was on the phone I advised patient that we are unable to draw other offices labs.  Then Anderson Malta the nurse [facility]) advised me that we, the PCP, should be drawing the labs, but they will get the patient taken care of.

## 2022-02-18 LAB — CBC WITH DIFFERENTIAL
Basophils Absolute: 0.1 10*3/uL (ref 0.0–0.2)
Basos: 1 %
EOS (ABSOLUTE): 0.2 10*3/uL (ref 0.0–0.4)
Eos: 2 %
Hematocrit: 43.2 % (ref 34.0–46.6)
Hemoglobin: 14.5 g/dL (ref 11.1–15.9)
Immature Grans (Abs): 0.1 10*3/uL (ref 0.0–0.1)
Immature Granulocytes: 1 %
Lymphocytes Absolute: 2.7 10*3/uL (ref 0.7–3.1)
Lymphs: 30 %
MCH: 29.9 pg (ref 26.6–33.0)
MCHC: 33.6 g/dL (ref 31.5–35.7)
MCV: 89 fL (ref 79–97)
Monocytes Absolute: 0.6 10*3/uL (ref 0.1–0.9)
Monocytes: 6 %
Neutrophils Absolute: 5.4 10*3/uL (ref 1.4–7.0)
Neutrophils: 60 %
RBC: 4.85 x10E6/uL (ref 3.77–5.28)
RDW: 13 % (ref 11.7–15.4)
WBC: 9 10*3/uL (ref 3.4–10.8)

## 2022-02-18 LAB — C-REACTIVE PROTEIN: CRP: 1 mg/L (ref 0–10)

## 2022-02-18 LAB — SEDIMENTATION RATE: Sed Rate: 13 mm/hr (ref 0–40)

## 2022-02-18 LAB — HLA-B27 ANTIGEN: HLA B27: POSITIVE

## 2022-02-23 DIAGNOSIS — R102 Pelvic and perineal pain: Secondary | ICD-10-CM | POA: Diagnosis not present

## 2022-03-04 ENCOUNTER — Encounter: Payer: Self-pay | Admitting: Nurse Practitioner

## 2022-03-04 ENCOUNTER — Other Ambulatory Visit: Payer: Self-pay

## 2022-03-04 ENCOUNTER — Ambulatory Visit: Payer: Federal, State, Local not specified - PPO | Admitting: Nurse Practitioner

## 2022-03-04 VITALS — BP 118/64 | HR 75 | Ht 64.0 in | Wt 122.4 lb

## 2022-03-04 DIAGNOSIS — K6289 Other specified diseases of anus and rectum: Secondary | ICD-10-CM

## 2022-03-04 DIAGNOSIS — K5900 Constipation, unspecified: Secondary | ICD-10-CM

## 2022-03-04 MED ORDER — "NITROGLYCERIN NICU 2% OINTMENT ": 1.0000 | TOPICAL_OINTMENT | Freq: Three times a day (TID) | TRANSDERMAL | 0 refills | Status: DC

## 2022-03-04 NOTE — Patient Instructions (Addendum)
If you are age 54 or younger, your body mass index should be between 19-25. Your Body mass index is 21.01 kg/m. If this is out of the aformentioned range listed, please consider follow up with your Primary Care Provider.  ________________________________________________________  The [place] [facility] providers would like to encourage you to use [facility] to communicate with providers for non-urgent requests or questions.  Due to long hold times on the telephone, sending your provider a message by [facility] may be a faster and more efficient way to get a response.  Please allow 48 business hours for a response.  Please remember that this is for non-urgent requests.  _______________________________________________________  Use Recticare as needed.  START Nitroglycerin ointment applied around and inside the anus three times daily.  START Metamucil (OTC) daily as directed on container.  Please call the office in 6 weeks and talk to Mickel Baas about how your symptoms are doing.  Follow up as needed.  Thank you for entrusting me with your care and choosing [facility].  Tye Savoy, NP-C

## 2022-03-04 NOTE — Progress Notes (Addendum)
Patient Profile:  Sheila [name] is a 56 y.o. female known to Sheila [name] with a past medical history of SLE, asthma, chronic interstitial cystitis, pelvic floor dysfunction, TAH/ BSO, anxiety, depression, fibromyalgia, adenomatous colon polyps, ? 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 Additional medical history as listed in [place] .   Assessment &  Plan   # Generalized abdominal cramping / rectal pain with occasional bleeding,  constipation with 2 or < BMs a week.  Symptoms not easy to sort out given history of high tone pelvic floor dysfunction and IC followed by Urology. Other than polyps recent polyp surveillance colonoscopy with retroflexion was unremarkable.  Fissure not seen on DRE today but she had significant anal discomfort  ( especially posterior midline) upon attempt at DRE Treat empirically for fissure with NTG ointment TID as demonstrated today in clinic Recticare as needed for rectal care.  Start daily Metamucil Call in 4 weeks with a condition update She is scheduled to start pelvic floor PT soon. Perhaps this may also help with some of her GI symptoms but if not then happy to see her again. If metamucil doesn't help constipation then will consider Miralax keeping in mind she wants to avoid medication if possible.   Addendum: Wrong dose of NTG ointment inadvertently sent to pharmacy. I contacted her by phone, she hasn't picked up Rx. Will cancel Rx with pharmacy and change to NTG 0.125%  ointment TID   # History of recurrent adenomatous colon polyps including removal of 5 small adenomas on May 2023 colonoscopy.   On recall list for 3 years    # Chronic abdominopelvic pain / interstitial cystitis, known high tone pelvic floor dysfunction    She is scheduled to start another course of pelvic floor PT soon.   # Alpha-1 carrier state with normal levels. Followed by Pulmonary. No liver abnormalities on CT scan  in 2020  HPI   Chief Complaint : rectal pain, abdominal cramping   Sheila [name] had a polyp surveillance colonoscopy last month. She was having GI issues prior to colonoscopy, no findings to explain her symptoms and she was asked to follow up in clinic to discuss things. She couldn't make her follow up with Sheila [name].   Interval History:  Sheila [name] has been having rectal pain and occasional rectal bleeding over the last few months. The rectal pain gets worse with sitting and also defecation.  She sometimes feels the urge to have a bowel movement but doesn't. Even when she has a BM the rectal pain doesn't get better.  She averages 2 or less BMs a week. She drinks water all day long. She hasn't tried a fiber supplement.  She isn't clear if the rectal pain is somehow related to her pelvic floor dysfunction managed by her Urologist Sheila [name].  Retroflexion view on recent colonoscopy was unremarkable.  In addition to rectal pain / bleeding she sometimes gets generalized abdominal cramping which gets better after a BM.  She wants to make sure that the colonoscopy ruled out any cancers in her colon.    Sheila [name] is followed by Urologist Sheila. Amalia [name] with [facility]. She has interstitial cystic and high tone pelvic floor dysfunction.  She is to about to start pelvic floor PT again which helped pelvic floor issues in the past but she wasn't having the rectal pain back then.  Per last Urology not on 01/01/22 the plan is for cystoscopy and  hydrodistention if PT isn't successful in helping urinary urgency and hesitancy problems. She has a Rx for Xanax for pelvic floor pain and has Trazadone to take for sleep and pain if needed.    Previous GI Evaluation   Feb 2020 Colonoscopy - The examined portion of the ileum was normal. Biopsied. jar 1 - Five 3 to 4 mm polyps in the transverse colon, in the cecum and at the appendiceal orifice, removed with a cold snare. Resected and retrieved. jar 2 - The colonic mucosa was possibly slightly  granular throughout and was randomly biospied. jar 3. - The examination was otherwise normal on direct and retroflexion views.  1. Surgical [P], terminal ileum BX - BENIGN SMALL BOWEL MUCOSA. - NO ACTIVE INFLAMMATION. - NO DYSPLASIA OR MALIGNANCY. 2. Surgical [P], ascending and cecum, transverse, polyp (5) - TUBULAR ADENOMA (X8 FRAGMENTS). - NO HIGH GRADE DYSPLASIA OR MALIGNANCY. 3. Surgical [P], random sites - BENIGN COLONIC MUCOSA. - NO ACTIVE INFLAMMATION OR EVIDENCE OF MICROSCOPIC COLITIS. - NO DYSPLASIA OR MALIGNANCY  02/05/22 polyps surveillance colonoscopy  - The examined portion of the ileum was normal. - Five 2 to 5 mm polyps in the sigmoid colon, in the descending colon and in the transverse colon, removed with a cold snare. Resected and retrieved. - The examination was otherwise normal on direct and retroflexion views.  Surgical [P], colon, sigmoid, descending and transverse, polyp (5) TUBULAR ADENOMAS NEGATIVE FOR HIGH-GRADE DYSPLASIA AND CARCINOMA   Labs:     Latest Ref Rng & Units 02/12/2022    2:37 PM 01/28/2022    9:28 AM 03/07/2021    4:13 PM  CBC  WBC 3.4 - 10.8 x10E3/uL 9.0  6.4  8.5   Hemoglobin 11.1 - 15.9 g/dL 14.5  13.3  13.9   Hematocrit 34.0 - 46.6 % 43.2  39.6  40.7   Platelets 150.0 - 400.0 K/uL  237.0  324.0        Latest Ref Rng & Units 01/28/2022    9:28 AM 03/07/2021    4:13 PM 08/23/2020    8:06 AM  Hepatic Function  Total Protein 6.0 - 8.3 g/dL 7.1  8.3  7.6   Albumin 3.5 - 5.2 g/dL 4.5  4.7  4.5   AST 0 - 37 U/L 14  18  16    ALT 0 - 35 U/L 9  8  9    Alk Phosphatase 39 - 117 U/L 82  121  122   Total Bilirubin 0.2 - 1.2 mg/dL 0.5  0.6  0.5     Past Medical History:  Diagnosis Date   AAT (alpha-1-antitrypsin) deficiency (HCC)    Anal fissure    Anxiety    Asthma    uses daily   Cancer ([place])    per gentic test   Chronic interstitial cystitis    Crohn disease ([place])    Fibromyalgia    Migraines    Pneumonia 2007   SLE (systemic  lupus erythematosus) ([place])     Past Surgical History:  Procedure Laterality Date   adnoids     BLADDER SURGERY     "couple"   CESAREAN SECTION     COLONOSCOPY     CYSTOSCOPY     KNEE ARTHROSCOPY     "COUPLE"   LAPAROSCOPIC VAGINAL HYSTERECTOMY WITH SALPINGO OOPHORECTOMY Bilateral 01/24/2020   Procedure: LAPAROSCOPIC ASSISTED VAGINAL HYSTERECTOMY WITH BILATERAL OOPHORECTOMY AND LEFT  SALPINGECTOMY;  Surgeon: Arvella Nigh, MD;  Location: [place];  Service: Gynecology;  Laterality:  Bilateral;  need bed   SALPINGECTOMY Right    right   TONSILLECTOMY     WRIST SURGERY Left    x 2    Current Medications, Allergies, Family History and Social History were reviewed in [employer] record.     Current Outpatient Medications  Medication Sig Dispense Refill   ALPRAZolam (XANAX) 0.5 MG tablet TAKE 1 TABLET(0.5 MG) BY MOUTH THREE TIMES DAILY AS NEEDED (Patient taking differently: Take 0.5 mg by mouth 3 (three) times daily as needed for anxiety.) 90 tablet 0   Cyanocobalamin (VITAMIN B-12 PO) Take 5,000 mcg by mouth daily.     tiZANidine (ZANAFLEX) 4 MG tablet TAKE 1 TABLET BY MOUTH TWICE DAILY AS NEEDED 60 tablet 1   topiramate (TOPAMAX) 100 MG tablet TAKE 1 TABLET(100 MG) BY MOUTH DAILY 30 tablet 2   traZODone (DESYREL) 50 MG tablet 100 mg.     triamcinolone cream (KENALOG) 0.1 % Apply 1 application. topically 2 (two) times daily. 45 g 3   No current facility-administered medications for this visit.    Review of Systems: No chest pain. No shortness of breath.     Physical Exam  Wt Readings from Last 3 Encounters:  02/05/22 124 lb (56.2 kg)  01/28/22 124 lb 12.8 oz (56.6 kg)  01/22/22 124 lb (56.2 kg)    LMP  (LMP Unknown)  BP 118/64, HR 75, BMI 21 Constitutional:  Generally well appearing female in no acute distress. Psychiatric: Pleasant. Normal mood and affect. Behavior is normal. EENT: Pupils normal.  Conjunctivae are normal. No  scleral icterus. Neck supple.  Cardiovascular: Normal rate, regular rhythm. No edema Pulmonary/chest: Effort normal and breath sounds normal. No wheezing, rales or rhonchi. Abdominal: Soft, nondistended, nontender. Bowel sounds active throughout. There are no masses palpable. No hepatomegaly. Rectal : non-inflamed external hemorrhoids. Incomplete DRE. Had intense pain ( mainly posterior midline area) just a my finger was being inserted. No obvious fissure.  Neurological: Alert and oriented to person place and time. Skin: Skin is warm and dry. No rashes noted.  Tye Savoy, NP  03/04/2022, 8:28 AM

## 2022-03-05 ENCOUNTER — Other Ambulatory Visit: Payer: Self-pay | Admitting: Nurse Practitioner

## 2022-03-05 ENCOUNTER — Other Ambulatory Visit: Payer: Self-pay

## 2022-03-05 ENCOUNTER — Telehealth: Payer: Self-pay

## 2022-03-05 MED ORDER — DILTIAZEM GEL 2 %: 1.0000 | Freq: Three times a day (TID) | CUTANEOUS | 0 refills | Status: DC

## 2022-03-05 NOTE — Telephone Encounter (Signed)
-----   Message from Willia Craze, NP sent at 03/05/2022  9:27 AM EDT ----- Mickel Baas,  We just spoke about this. You can covert this to a phone note.  Please call in diltiazem 2% ointment to be used as directed 3 times daily for 6 weeks.  I showed her how to use this in clinic yesterday.Marland Kitchen  She would like to use [facility] in [facility] which is apparently a [employer].  Please call this pharmacy and if they are able to make the diltiazem that is great but if not then she will probably need to come to [facility] to get the ointment.  Thanks

## 2022-03-05 NOTE — Telephone Encounter (Signed)
[facility] in [facility] and they stated they are not able to make Diltiazem 2% gel. Prescription sent to [facility]. Called pt to let her know and pt verbalized understanding.

## 2022-03-08 DIAGNOSIS — M5136 Other intervertebral disc degeneration, lumbar region: Secondary | ICD-10-CM | POA: Diagnosis not present

## 2022-03-11 ENCOUNTER — Telehealth: Payer: Self-pay

## 2022-03-11 NOTE — Telephone Encounter (Signed)
Left message for pt to call back  °

## 2022-03-12 ENCOUNTER — Ambulatory Visit (INDEPENDENT_AMBULATORY_CARE_PROVIDER_SITE_OTHER): Payer: Federal, State, Local not specified - PPO | Admitting: Family Medicine

## 2022-03-12 ENCOUNTER — Other Ambulatory Visit: Payer: Self-pay

## 2022-03-12 VITALS — BP 120/80 | HR 74 | Ht 64.0 in | Wt 122.0 lb

## 2022-03-12 DIAGNOSIS — F411 Generalized anxiety disorder: Secondary | ICD-10-CM | POA: Diagnosis not present

## 2022-03-12 DIAGNOSIS — F419 Anxiety disorder, unspecified: Secondary | ICD-10-CM | POA: Diagnosis not present

## 2022-03-12 MED ORDER — VENLAFAXINE HCL ER 37.5 MG PO CP24: ORAL_CAPSULE | ORAL | 0 refills | Status: DC

## 2022-03-12 MED ORDER — VITAMIN D (ERGOCALCIFEROL) 1.25 MG (50000 UNIT) PO CAPS: 50000.0000 [IU] | ORAL_CAPSULE | ORAL | 1 refills | Status: DC

## 2022-03-15 DIAGNOSIS — M329 Systemic lupus erythematosus, unspecified: Secondary | ICD-10-CM | POA: Diagnosis not present

## 2022-03-15 DIAGNOSIS — H5213 Myopia, bilateral: Secondary | ICD-10-CM | POA: Diagnosis not present

## 2022-03-15 DIAGNOSIS — H524 Presbyopia: Secondary | ICD-10-CM | POA: Diagnosis not present

## 2022-03-15 NOTE — Assessment & Plan Note (Signed)
Start effexor  Cont prn xanax

## 2022-03-15 NOTE — Progress Notes (Signed)
Established Patient Office Visit  Subjective   Patient ID: Sheila [name], female    DOB: [birth year]  Age: 56 y.o. MRN: [id number]  Chief Complaint  Patient presents with   Anxiety    HPI Pt here to f/u anxiety --- it is worsening  Patient Active Problem List   Diagnosis Date Noted   Bilateral hip pain 01/28/2022   Low back pain with radiation 01/28/2022   Preventative health care 01/28/2022   COVID-19 02/09/2021   Dyslipidemia 05/18/2020   Severe anxiety with panic 05/18/2020   Post traumatic stress disorder 05/18/2020   Severe episode of recurrent major depressive disorder, without psychotic features ([place]) 05/18/2020   Alpha-1-antitrypsin deficiency ([place]) 05/18/2020   Abnormal vaginal bleeding 01/24/2020   S/P laparoscopic assisted vaginal hysterectomy ([facility]) 01/24/2020   Chronic fatigue 01/05/2020   Epistaxis 11/19/2019   Dry eyes 09/08/2019   Mild persistent asthma without complication 28/36/6294   Hoarseness, persistent 02/27/2018   Reflux laryngitis 02/27/2018   Rectal pain 03/05/2017   H/O Crohn's disease 03/05/2017   Bloating 03/05/2017   Migraine without aura and without status migrainosus, not intractable 08/27/2016   Depression with anxiety 08/04/2016   Fibromyalgia 08/04/2016   Migraines 07/11/2015   Rash and nonspecific skin eruption 04/13/2015   Memory loss of unknown cause 04/13/2015   External hemorrhoid 02/25/2013   Left knee pain 05/04/2012   Anxiety state 11/26/2010   DEPRESSIVE DISORDER 01/11/2009   ABDOMINAL BLOATING 08/19/2008   CROHN'S DISEASE 06/29/2008   LYMPHOPENIA 11/19/2007   HYPERTHYROIDISM 11/05/2007   CERVICAL LYMPHADENOPATHY 11/03/2007   CHRONIC INTERSTITIAL CYSTITIS 08/04/2007   Fibromyalgia muscle pain 08/04/2007   Chest pain 08/04/2007   HEMATURIA, HX OF 08/04/2007   Systemic lupus erythematosus, unspecified SLE type, unspecified organ involvement status ([place])    Past Medical History:  Diagnosis Date   AAT  (alpha-1-antitrypsin) deficiency ([place])    Anal fissure    Anxiety    Asthma    uses daily   Cancer ([place])    per gentic test   Chronic interstitial cystitis    Crohn disease ([place])    Fibromyalgia    Migraines    Pneumonia 2007   SLE (systemic lupus erythematosus) ([place])    Past Surgical History:  Procedure Laterality Date   adnoids     BLADDER SURGERY     "couple"   CESAREAN SECTION     COLONOSCOPY     CYSTOSCOPY     KNEE ARTHROSCOPY     "COUPLE"   LAPAROSCOPIC VAGINAL HYSTERECTOMY WITH SALPINGO OOPHORECTOMY Bilateral 01/24/2020   Procedure: LAPAROSCOPIC ASSISTED VAGINAL HYSTERECTOMY WITH BILATERAL OOPHORECTOMY AND LEFT  SALPINGECTOMY;  Surgeon: Arvella Nigh, MD;  Location: [place];  Service: Gynecology;  Laterality: Bilateral;  need bed   SALPINGECTOMY Right    right   TONSILLECTOMY     WRIST SURGERY Left    x 2   Social History   Tobacco Use   Smoking status: Never   Smokeless tobacco: Never  Vaping Use   Vaping Use: Never used  Substance Use Topics   Alcohol use: No    Alcohol/week: 0.0 standard drinks of alcohol   Drug use: No   Social History   Socioeconomic History   Marital status: Married    Spouse name: Not on file   Number of children: 2   Years of education: Not on file   Highest education level: Not on file  Occupational History   Occupation: Preschool teacher  Tobacco Use  Smoking status: Never   Smokeless tobacco: Never  Vaping Use   Vaping Use: Never used  Substance and Sexual Activity   Alcohol use: No    Alcohol/week: 0.0 standard drinks of alcohol   Drug use: No   Sexual activity: Yes  Other Topics Concern   Not on file  Social History Narrative   Yoga , walking and mindfulness for exercise and stress relief    Social Determinants of Health   Financial Resource Strain: Not on file  Food Insecurity: Not on file  Transportation Needs: Not on file  Physical Activity: Not on file  Stress: Not on file  Social  Connections: Not on file  Intimate Partner Violence: Not on file   Family Status  Relation Name Status   Mother  Alive       breast cancer   Father  Deceased       lung cancer   Sister  Alive       ministokes, heart problems   Sister  (Not Specified)   Mat Aunt  (Not Specified)   Mat Aunt  (Not Specified)   MGM  (Not Specified)   MGF  (Not Specified)   Son  Alive       healthy   Son  Alive       healthy   Neg Hx  (Not Specified)   Family History  Problem Relation Age of Onset   Breast cancer Mother    Lung cancer Father        Died in 11/14/11   Heart disease Father    Irritable bowel syndrome Sister    Breast cancer Maternal Aunt    Ovarian cancer Maternal Aunt    Breast cancer Maternal Grandmother    Ovarian cancer Maternal Grandmother    Prostate cancer Maternal Grandfather    Colon cancer Maternal Grandfather    ADD / ADHD Son    Colon polyps Neg Hx    Esophageal cancer Neg Hx    Kidney disease Neg Hx    Gallbladder disease Neg Hx    Rectal cancer Neg Hx    Stomach cancer Neg Hx    Crohn's disease Neg Hx    Allergies  Allergen Reactions   Ampicillin    Cymbalta [Duloxetine Hcl] Nausea Only    Constipation, bloating   Doxycycline Diarrhea   Fentanyl Citrate Nausea And Vomiting    fever   Penicillins       Review of Systems  Constitutional:  Negative for fever and malaise/fatigue.  HENT:  Negative for congestion.   Eyes:  Negative for blurred vision.  Respiratory:  Negative for shortness of breath.   Cardiovascular:  Negative for chest pain, palpitations and leg swelling.  Gastrointestinal:  Negative for abdominal pain, blood in stool and nausea.  Genitourinary:  Negative for dysuria and frequency.  Musculoskeletal:  Negative for falls.  Skin:  Negative for rash.  Neurological:  Negative for dizziness, loss of consciousness and headaches.  Endo/Heme/Allergies:  Negative for environmental allergies.  Psychiatric/Behavioral:  Negative for depression.  The patient is not nervous/anxious.       Objective:     LMP  (LMP Unknown)  BP Readings from Last 3 Encounters:  03/04/22 118/64  02/05/22 110/66  01/28/22 100/68   Wt Readings from Last 3 Encounters:  03/04/22 122 lb 6.4 oz (55.5 kg)  02/05/22 124 lb (56.2 kg)  01/28/22 124 lb 12.8 oz (56.6 kg)   SpO2 Readings from Last 3 Encounters:  03/04/22  98%  02/05/22 99%  01/28/22 99%      Physical Exam Vitals and nursing note reviewed.  Constitutional:      Appearance: She is well-developed.  HENT:     Head: Normocephalic and atraumatic.  Eyes:     Conjunctiva/sclera: Conjunctivae normal.  Neck:     Thyroid: No thyromegaly.     Vascular: No carotid bruit or JVD.  Cardiovascular:     Rate and Rhythm: Normal rate and regular rhythm.     Heart sounds: Normal heart sounds. No murmur heard. Pulmonary:     Effort: Pulmonary effort is normal. No respiratory distress.     Breath sounds: Normal breath sounds. No wheezing or rales.  Chest:     Chest wall: No tenderness.  Musculoskeletal:     Cervical back: Normal range of motion and neck supple.  Neurological:     General: No focal deficit present.     Mental Status: She is alert and oriented to person, place, and time.  Psychiatric:        Mood and Affect: Mood is anxious and depressed. Affect is not tearful.        Thought Content: Thought content does not include homicidal or suicidal ideation. Thought content does not include homicidal or suicidal plan.      No results found for any visits on 03/12/22.  Last CBC Lab Results  Component Value Date   WBC 9.0 02/12/2022   HGB 14.5 02/12/2022   HCT 43.2 02/12/2022   MCV 89 02/12/2022   MCH 29.9 02/12/2022   RDW 13.0 02/12/2022   PLT 237.0 83/66/2947   Last metabolic panel Lab Results  Component Value Date   GLUCOSE 94 01/28/2022   NA 141 01/28/2022   K 4.2 01/28/2022   CL 107 01/28/2022   CO2 23 01/28/2022   BUN 14 01/28/2022   CREATININE 0.94 01/28/2022    GFRNONAA >60 01/20/2020   CALCIUM 9.5 01/28/2022   PROT 7.1 01/28/2022   ALBUMIN 4.5 01/28/2022   BILITOT 0.5 01/28/2022   ALKPHOS 82 01/28/2022   AST 14 01/28/2022   ALT 9 01/28/2022   ANIONGAP 10 01/20/2020   Last lipids Lab Results  Component Value Date   CHOL 256 (H) 01/28/2022   HDL 75.20 01/28/2022   LDLCALC 167 (H) 01/28/2022   TRIG 69.0 01/28/2022   CHOLHDL 3 01/28/2022   Last hemoglobin A1c No results found for: "HGBA1C" Last thyroid functions Lab Results  Component Value Date   TSH 1.28 01/28/2022   Last vitamin D Lab Results  Component Value Date   VD25OH 25.06 (L) 01/28/2022   Last vitamin B12 and Folate Lab Results  Component Value Date   VITAMINB12 >1504 (H) 01/28/2022   FOLATE 10.8 03/05/2010      The 10-year ASCVD risk score (Arnett DK, et al., 2019) is: 1.3%    Assessment & Plan:   Problem List Items Addressed This Visit       Unprioritized   Anxiety state    Start effexor  Cont prn xanax       Relevant Medications   venlafaxine XR (EFFEXOR XR) 37.5 MG 24 hr capsule   Other Visit Diagnoses     Anxiety    -  Primary   Relevant Medications   venlafaxine XR (EFFEXOR XR) 37.5 MG 24 hr capsule       No follow-ups on file.    Ann Held, DO

## 2022-03-20 DIAGNOSIS — M533 Sacrococcygeal disorders, not elsewhere classified: Secondary | ICD-10-CM | POA: Diagnosis not present

## 2022-04-01 ENCOUNTER — Encounter: Payer: Self-pay | Admitting: Family Medicine

## 2022-04-03 DIAGNOSIS — M3219 Other organ or system involvement in systemic lupus erythematosus: Secondary | ICD-10-CM | POA: Diagnosis not present

## 2022-04-03 DIAGNOSIS — M533 Sacrococcygeal disorders, not elsewhere classified: Secondary | ICD-10-CM | POA: Diagnosis not present

## 2022-04-03 DIAGNOSIS — M546 Pain in thoracic spine: Secondary | ICD-10-CM | POA: Diagnosis not present

## 2022-04-03 MED ORDER — VENLAFAXINE HCL ER 150 MG PO CP24: 150.0000 mg | ORAL_CAPSULE | Freq: Every day | ORAL | 1 refills | Status: DC

## 2022-04-03 NOTE — Telephone Encounter (Signed)
Pt called the office and was made aware of Dr. Katina Degree recommendation. New Rx sent in

## 2022-04-07 ENCOUNTER — Other Ambulatory Visit: Payer: Self-pay | Admitting: Family Medicine

## 2022-04-07 DIAGNOSIS — F419 Anxiety disorder, unspecified: Secondary | ICD-10-CM

## 2022-04-08 ENCOUNTER — Telehealth: Payer: Federal, State, Local not specified - PPO | Admitting: Family Medicine

## 2022-04-09 ENCOUNTER — Ambulatory Visit: Payer: Federal, State, Local not specified - PPO | Admitting: Gastroenterology

## 2022-04-25 ENCOUNTER — Telehealth: Payer: Federal, State, Local not specified - PPO | Admitting: Family Medicine

## 2022-04-26 DIAGNOSIS — M6281 Muscle weakness (generalized): Secondary | ICD-10-CM | POA: Diagnosis not present

## 2022-04-26 DIAGNOSIS — R102 Pelvic and perineal pain: Secondary | ICD-10-CM | POA: Diagnosis not present

## 2022-04-26 DIAGNOSIS — M62838 Other muscle spasm: Secondary | ICD-10-CM | POA: Diagnosis not present

## 2022-04-26 DIAGNOSIS — M6289 Other specified disorders of muscle: Secondary | ICD-10-CM | POA: Diagnosis not present

## 2022-04-29 ENCOUNTER — Encounter (INDEPENDENT_AMBULATORY_CARE_PROVIDER_SITE_OTHER): Payer: Self-pay

## 2022-04-29 DIAGNOSIS — H353231 Exudative age-related macular degeneration, bilateral, with active choroidal neovascularization: Secondary | ICD-10-CM | POA: Diagnosis not present

## 2022-04-30 ENCOUNTER — Ambulatory Visit (INDEPENDENT_AMBULATORY_CARE_PROVIDER_SITE_OTHER): Payer: Federal, State, Local not specified - PPO | Admitting: Ophthalmology

## 2022-04-30 ENCOUNTER — Encounter (INDEPENDENT_AMBULATORY_CARE_PROVIDER_SITE_OTHER): Payer: Self-pay | Admitting: Ophthalmology

## 2022-04-30 DIAGNOSIS — H43823 Vitreomacular adhesion, bilateral: Secondary | ICD-10-CM

## 2022-04-30 DIAGNOSIS — H3554 Dystrophies primarily involving the retinal pigment epithelium: Secondary | ICD-10-CM | POA: Diagnosis not present

## 2022-04-30 DIAGNOSIS — H353132 Nonexudative age-related macular degeneration, bilateral, intermediate dry stage: Secondary | ICD-10-CM

## 2022-04-30 NOTE — Assessment & Plan Note (Signed)
Subfoveal lesion OS, without attendant other areas of involvement of typical AMD  Juxta foveal OD, also attended with [facility] VMT seen on OCT OU

## 2022-04-30 NOTE — Progress Notes (Signed)
04/30/2022     CHIEF COMPLAINT Patient presents for No chief complaint on file.     HISTORY OF PRESENT ILLNESS: Sheila [name] is a 56 y.o. female who presents to the clinic today for:   HPI   NP- non exudative maculat degenration ou - ref Dr Sharen Counter Pt states her vision has not been stable for a long time Pt admits to floaters and fol that she has had for a long time  Pt states she has many autoimmune disease and she believes that it effects her eyes.  Last edited by Morene Rankins, CMA on 04/30/2022  1:38 PM.      Referring physician: Amedeo Kinsman, OD [address]  [place] [address]  HISTORICAL INFORMATION:   Selected notes from the MEDICAL RECORD NUMBER       CURRENT MEDICATIONS: No current outpatient medications on file. (Ophthalmic Drugs)   No current facility-administered medications for this visit. (Ophthalmic Drugs)   Current Outpatient Medications (Other)  Medication Sig   ALPRAZolam (XANAX) 0.5 MG tablet TAKE 1 TABLET(0.5 MG) BY MOUTH THREE TIMES DAILY AS NEEDED (Patient taking differently: Take 0.5 mg by mouth 3 (three) times daily as needed for anxiety.)   Cyanocobalamin (VITAMIN B-12 PO) Take 5,000 mcg by mouth daily.   diltiazem 2 % GEL Apply 1 Application topically 3 (three) times daily. As directed for 6 weeks.   tiZANidine (ZANAFLEX) 4 MG tablet TAKE 1 TABLET BY MOUTH TWICE DAILY AS NEEDED   topiramate (TOPAMAX) 100 MG tablet TAKE 1 TABLET(100 MG) BY MOUTH DAILY   traZODone (DESYREL) 50 MG tablet 100 mg.   triamcinolone cream (KENALOG) 0.1 % Apply 1 application. topically 2 (two) times daily.   venlafaxine XR (EFFEXOR-XR) 150 MG 24 hr capsule Take 1 capsule (150 mg total) by mouth daily with breakfast.   Vitamin D, Ergocalciferol, (DRISDOL) 1.25 MG (50000 UNIT) CAPS capsule Take 1 capsule (50,000 Units total) by mouth every 7 (seven) days.   No current facility-administered medications for this visit. (Other)      REVIEW OF  SYSTEMS: ROS   Negative for: Constitutional, Gastrointestinal, Neurological, Skin, Genitourinary, Musculoskeletal, HENT, Endocrine, Cardiovascular, Eyes, Respiratory, Psychiatric, Allergic/Imm, Heme/Lymph Last edited by Hurman Horn, MD on 04/30/2022  2:22 PM.       ALLERGIES Allergies  Allergen Reactions   Ampicillin    Cymbalta [Duloxetine Hcl] Nausea Only    Constipation, bloating   Doxycycline Diarrhea   Fentanyl Citrate Nausea And Vomiting    fever   Penicillins     PAST MEDICAL HISTORY Past Medical History:  Diagnosis Date   AAT (alpha-1-antitrypsin) deficiency ([place])    Anal fissure    Anxiety    Asthma    uses daily   Cancer ([place])    per gentic test   Chronic interstitial cystitis    Crohn disease ([place])    Fibromyalgia    Migraines    Pneumonia 2007   SLE (systemic lupus erythematosus) ([place])    Past Surgical History:  Procedure Laterality Date   adnoids     BLADDER SURGERY     "couple"   CESAREAN SECTION     COLONOSCOPY     CYSTOSCOPY     KNEE ARTHROSCOPY     "COUPLE"   LAPAROSCOPIC VAGINAL HYSTERECTOMY WITH SALPINGO OOPHORECTOMY Bilateral 01/24/2020   Procedure: LAPAROSCOPIC ASSISTED VAGINAL HYSTERECTOMY WITH BILATERAL OOPHORECTOMY AND LEFT  SALPINGECTOMY;  Surgeon: Arvella Nigh, MD;  Location: [facility];  Service: Gynecology;  Laterality: Bilateral;  need bed   SALPINGECTOMY Right    right   TONSILLECTOMY     WRIST SURGERY Left    x 2    FAMILY HISTORY Family History  Problem Relation Age of Onset   Breast cancer Mother    Lung cancer Father        Died in Nov 28, 2011   Heart disease Father    Irritable bowel syndrome Sister    Breast cancer Maternal Aunt    Ovarian cancer Maternal Aunt    Breast cancer Maternal Grandmother    Ovarian cancer Maternal Grandmother    Prostate cancer Maternal Grandfather    Colon cancer Maternal Grandfather    ADD / ADHD Son    Colon polyps Neg Hx    Esophageal cancer Neg Hx    Kidney disease  Neg Hx    Gallbladder disease Neg Hx    Rectal cancer Neg Hx    Stomach cancer Neg Hx    Crohn's disease Neg Hx     SOCIAL HISTORY Social History   Tobacco Use   Smoking status: Never   Smokeless tobacco: Never  Vaping Use   Vaping Use: Never used  Substance Use Topics   Alcohol use: No    Alcohol/week: 0.0 standard drinks of alcohol   Drug use: No         OPHTHALMIC EXAM:  Base Eye Exam     Visual Acuity (ETDRS)       Right Left   Dist cc 20/30 20/30    Correction: Glasses         Tonometry (Tonopen, 1:44 PM)       Right Left   Pressure 13 12         Pupils       Pupils Shape   Right PERRL Round   Left PERRL          Visual Fields       Left Right    Full Full         Extraocular Movement       Right Left    Ortho Ortho    -- -- --  --  --  -- -- --   -- -- --  --  --  -- -- --           Dilation     Both eyes: 1.0% Mydriacyl, 2.5% Phenylephrine @ 1:40 PM           Slit Lamp and Fundus Exam     External Exam       Right Left   External Normal Normal         Slit Lamp Exam       Right Left   Lids/Lashes Normal Normal   Conjunctiva/Sclera White and quiet White and quiet   Cornea Clear Clear   Anterior Chamber Deep and quiet Deep and quiet   Iris Round and reactive Round and reactive   Lens Trace Nuclear sclerosis Trace Nuclear sclerosis   Anterior Vitreous Normal Normal         Fundus Exam       Right Left   Posterior Vitreous Normal Normal   Disc Normal Normal   C/D Ratio 0.35 0.45   Macula Juxta foveal pale gray-yellow filiform like lesions Subfoveal pale gray vitelliform type lesion   Vessels Normal Normal   Periphery Normal Normal            IMAGING AND PROCEDURES  Imaging and Procedures for 04/30/22  OCT, Retina - OU - Both Eyes       Right Eye Quality was good. Scan locations included subfoveal. Central Foveal Thickness: 296. Progression has no prior data. Findings include  abnormal foveal contour, retinal drusen , vitreomacular adhesion .   Left Eye Quality was good. Scan locations included subfoveal. Central Foveal Thickness: 349. Progression has no prior data. Findings include abnormal foveal contour, retinal drusen .   Notes OD with juxta foveal large drusenoid deposits on either side of the FAZ.  Nasal aspect of the FAZ has a focal large drusenoid deposit beneath an area of vertical tractional changes on the attached vitreous  OS with large subfoveal Drusenoid vitelliform type lesion in the foveal location.  Incidentally there VM traction and attachment is still noted and apparently with possible anterior posterior tractional features in the temporal aspect of the FAZ elevated.     Color Fundus Photography Optos - OU - Both Eyes       Right Eye Progression has no prior data. Macula : drusen. Vessels : normal observations. Periphery : normal observations.   Left Eye Progression has no prior data. Disc findings include normal observations. Macula : drusen. Vessels : normal observations. Periphery : normal observations.   Notes NO Signs of vasculitis of the retinal vasculature. OD with vitelliform pale gray type lesions in the fovea, most consistent with tractional changes and not typical AMD.  OS subfoveal vitelliform type changes in the fovea.  Correlation with OCT is appropriate             ASSESSMENT/PLAN:  Vitreomacular adhesion of both eyes Very dense vitreous strands attached to the maculae of each eye.  Coincident in the right eye with a large subfoveal drusenoid is present nasal to the FAZ.  These were reviewed with the patient.  My concern that there is vitreomacular adhesion leading to this elevation of the macula which can in fact resolve if vitrectomy and release of the posterior hyaloid is accomplished at the time of vitrectomy.  Patient understands however that she does not have significant cataract.  If vitrectomy were undertaken  in the either eye to release the vitreomacular adhesion the cataract would progress typically in 6 to 12 months.  Routine cataract surgery could be performed at that time with no damage or impact on the macula in either eye.    Intermediate stage nonexudative age-related macular degeneration of both eyes Unusual form of vitelliform type macular dystrophy in the left eye subfoveal location coincident with vitreous stranding vitreomacular adhesion.  I have seen this in elderly patients mid age middle-age patients as well as in younger patients and I can concerns that the vitreomacular adhesion is causing this type of vitelliform type lesion in this patient and now disturbing the outer retinal layers and imparting blurred vision.  I discussed this with the patient vitrectomy could be undertaken which would allow the underlying retina to absorb this material.  However there is no guarantee that the absorption of that material would restore normal visual functioning or prevent vision loss.  Persistent vitelliform lesions of left alone untreated also tend to go to the same "atrophic appearance untreated over time.  Atypical vitelliform macular dystrophy Subfoveal lesion OS, without attendant other areas of involvement of typical AMD  Juxta foveal OD, also attended with VMA VMT seen on OCT OU     ICD-10-CM   1. Vitreomacular adhesion of both eyes  H43.823 OCT, Retina - OU - Both  Eyes    Color Fundus Photography Optos - OU - Both Eyes    2. Intermediate stage nonexudative age-related macular degeneration of both eyes  H35.3132 OCT, Retina - OU - Both Eyes    Color Fundus Photography Optos - OU - Both Eyes    3. Atypical vitelliform macular dystrophy  H35.54       1.  OU with unusual adult vitelliform type macular lesions.  These are gray and usually not the classic yellowish configuration.  These these are also present in areas correlated with OCT of some anterior posterior tear of traction from  the vitreomacular adhesion.  I have seen this before and I have reviewed the patient with a past a history of similar to these findings and the configuration of what happens with vitrectomy to release the anterior posterior traction in the eye.  I explained to the patient that the ultimate outcome is altered but is hastened.  The ultimate outcome depends upon the extent of RPE atrophy, choriocapillaris perfusion in the subjacent area.  The left eye already has photoreceptor layer degeneration hence she has more blurred vision in the left eye from this condition, nodular deposit.  2.  Either I have potential for vitrectomy to release this type of traction.  The attendant risk however triggering retinal tear detachment or failure to accomplish the goals of the surgery meaning that the release of traction may not trigger resolution of these nodular deposits.  Nonetheless my experiences that these nodular deposits will subside over months with the release of the vitreomacular adhesion.  3.  Each eye also has the option of simply using vitamin therapy, AREDS 2 type supplementation 1 tablet twice daily and treat this as typical age-related macular degeneration.  I am more than little skeptical that that is the case here.  4.  There are no signs of lupus retinopathy.  No signs of vasculitis of the retina associate with Crohn's disease  Ophthalmic Meds Ordered this visit:  No orders of the defined types were placed in this encounter.      Return in about 6 months (around 10/31/2022) for DILATE OU, OCT, COLOR FP.  Patient Instructions  The nature of age--related macular degeneration was discussed with the patient as well as the distinction between dry and wet types. Checking an Amsler Grid daily with advice to return immediately should a distortion develop, was given to the patient. The patient 's smoking status now and in the past was determined and advice based on the AREDS study was provided regarding  the consumption of antioxidant supplements. AREDS 2 vitamin formulation was recommended. Consumption of dark leafy vegetables and fresh fruits of various colors was recommended. Treatment modalities for wet macular degeneration particularly the use of intravitreal injections of anti-blood vessel growth factors was discussed with the patient. Avastin, Lucentis, and Eylea are the available options. On occasion, therapy includes the use of photodynamic therapy and thermal laser. Stressed to the patient do not rub eyes.  Patient was advised to check Amsler Grid daily and return immediately if changes are noted. Instructions on using the grid were given to the patient. All patient questions were answered. Age-Related Macular Degeneration  Age-related macular degeneration (ARMD) is an eye disease related to aging. The disease is a problem of one of the back layers of the eye (retina) causing a loss of central vision. Central vision allows a person to see objects clearly and do daily tasks like reading and driving. There are two main types of  ARMD: Dry ARMD. People with this type generally lose their vision slowly. This is the most common type of ARMD. Some people with dry ARMD notice very little change in their vision as they age. Wet ARMD. People with this type can lose their vision quickly. What are the causes? This condition is caused by damage to the part of the retina that provides you with central vision (macula). Dry ARMD happens when deposits in the macula cause light-sensitive cells to slowly break down. Wet ARMD happens when abnormal blood vessels grow under the macula and leak blood and fluid. What increases the risk? You are more likely to develop this condition if you: Are 36 years old or older, and especially 56 years old or older. Smoke. Are obese. Have a family history of ARMD. Have high cholesterol, high blood pressure, or heart disease. Have been exposed to high levels of ultraviolet  (UV) light and blue light. Are white (Caucasian). Are female. What are the signs or symptoms? Common symptoms of this condition include: Blurred vision, especially when reading print material. The blurred vision often improves in brighter light. A blurred or blind spot in the center of your field of vision that is small but growing larger. Bright colors seeming less bright than they used to be. Decreased ability to recognize and see faces. One eye seeing worse than the other. Decreased ability to adapt to dimly lit rooms. Straight lines appearing crooked or wavy. How is this diagnosed? This condition is diagnosed based on your symptoms and an eye exam. During the eye exam: Eye drops will be placed into your eyes to enlarge (dilate) your pupils. This will allow your health care provider to see the back of your eye. You may be asked to look at an image that looks like a checkerboard (Amsler grid). Early changes in your central vision may cause the grid to appear distorted. During the exam, you may be given one or both of these tests: Intravenous fluorescein angiogram. This test helps determine whether you have dry or wet ARMD. Optical coherence tomography (OCT) test to evaluate deep layers of the retina. How is this treated? There is no cure for this condition, but treatment can help to slow down progression of ARMD. Dry ARMD is often treated with vitamin supplements. Wet ARMD may be treatable with medications placed inside the eye or laser treatments. Treatments include: Supplements, including vitamin C, vitamin E, lutein, zeaxanthin, copper, and zinc. Injections of medicines (including anti-blood vessel growth factors and steroids) into your eye to slow down the formation of abnormal blood vessels that may leak and decrease fluid accumulation. These injections often need to be repeated on a routine basis. Laser surgery to destroy abnormal blood vessels or leaking blood vessels in your  eye. Low vision aids may be very helpful if you have poor vision in both eyes from ARMD. Examples of low vision aids include magnifiers, tablet computers, and large-print products. Follow these instructions at home: Do not use any products that contain nicotine or tobacco. These products include cigarettes, chewing tobacco, and vaping devices, such as e-cigarettes. If you need help quitting, ask your health care provider. Take over-the-counter and prescription medicines only as told by your health care provider. Take vitamins and supplements as told by your health care provider. Ask your health care provider for an Amsler grid. Use it every day to check each eye for vision changes. Get an eye exam as often as told by your health care provider. Make sure to  get an eye exam at least once every year. Keep all follow-up visits. This is important. Where to find support Macular Disease Society: www.macularsociety.[place] [employer]: http://reyes-guerrero.com/ Contact a health care provider if: You notice any changes in your vision. Get help right away if: You suddenly lose vision or develop pain in the eye. Summary Age-related macular degeneration (ARMD) is an eye disease related to aging. There are two types of this condition: dry ARMD and wet ARMD. This condition is caused by damage to the part of the retina that provides you with central vision (macula). While there is no cure for ARMD, treatments can slow down progression. Once diagnosed with ARMD, make sure to get an eye exam every year, take supplements and vitamins as directed, use an Amsler grid at home, and follow up with your health care provider as directed. This information is not intended to replace advice given to you by your health care provider. Make sure you discuss any questions you have with your health care provider. Document Revised: 04/24/2021 Document Reviewed: 04/24/2021 Elsevier Patient Education  [place] the diagnoses, plan, and follow up with the patient and they expressed understanding.  Patient expressed understanding of the importance of proper follow up care.   Clent Demark Michio Thier M.D. Diseases & Surgery of the Retina and Vitreous [facility]ina [facility] Diabetic [place] 04/30/22     Abbreviations: M myopia (nearsighted); A astigmatism; H hyperopia (farsighted); P presbyopia; Mrx spectacle prescription;  CTL contact lenses; OD right eye; OS left eye; OU both eyes  XT exotropia; ET esotropia; PEK punctate epithelial keratitis; PEE punctate epithelial erosions; DES dry eye syndrome; MGD meibomian gland dysfunction; ATs artificial tears; PFAT's preservative free artificial tears; [place] nuclear sclerotic cataract; PSC posterior subcapsular cataract; ERM epi-retinal membrane; PVD posterior vitreous detachment; RD retinal detachment; DM diabetes mellitus; DR diabetic retinopathy; NPDR non-proliferative diabetic retinopathy; PDR proliferative diabetic retinopathy; CSME clinically significant macular edema; DME diabetic macular edema; dbh dot blot hemorrhages; CWS cotton wool spot; POAG primary open angle glaucoma; C/D cup-to-disc ratio; HVF humphrey visual field; GVF goldmann visual field; OCT optical coherence tomography; IOP intraocular pressure; BRVO Branch retinal vein occlusion; CRVO central retinal vein occlusion; CRAO central retinal artery occlusion; BRAO branch retinal artery occlusion; RT retinal tear; SB scleral buckle; PPV pars plana vitrectomy; VH Vitreous hemorrhage; PRP panretinal laser photocoagulation; IVK intravitreal kenalog; VMT vitreomacular traction; MH Macular hole;  NVD neovascularization of the disc; NVE neovascularization elsewhere; AREDS age related eye disease study; ARMD age related macular degeneration; POAG primary open angle glaucoma; EBMD epithelial/anterior basement membrane dystrophy; ACIOL anterior chamber intraocular lens; IOL intraocular lens; PCIOL posterior chamber  intraocular lens; Phaco/IOL phacoemulsification with intraocular lens placement; [place] photorefractive keratectomy; LASIK laser assisted in situ keratomileusis; HTN hypertension; DM diabetes mellitus; COPD chronic obstructive pulmonary disease

## 2022-04-30 NOTE — Assessment & Plan Note (Signed)
Very dense vitreous strands attached to the maculae of each eye.  Coincident in the right eye with a large subfoveal drusenoid is present nasal to the FAZ.  These were reviewed with the patient.  My concern that there is vitreomacular adhesion leading to this elevation of the macula which can in fact resolve if vitrectomy and release of the posterior hyaloid is accomplished at the time of vitrectomy.  Patient understands however that she does not have significant cataract.  If vitrectomy were undertaken in the either eye to release the vitreomacular adhesion the cataract would progress typically in 6 to 12 months.  Routine cataract surgery could be performed at that time with no damage or impact on the macula in either eye.

## 2022-04-30 NOTE — Patient Instructions (Addendum)
The nature of age--related macular degeneration was discussed with the patient as well as the distinction between dry and wet types. Checking an Amsler Grid daily with advice to return immediately should a distortion develop, was given to the patient. The patient 's smoking status now and in the past was determined and advice based on the AREDS study was provided regarding the consumption of antioxidant supplements. AREDS 2 vitamin formulation was recommended. Consumption of dark leafy vegetables and fresh fruits of various colors was recommended. Treatment modalities for wet macular degeneration particularly the use of intravitreal injections of anti-blood vessel growth factors was discussed with the patient. Avastin, Lucentis, and Eylea are the available options. On occasion, therapy includes the use of photodynamic therapy and thermal laser. Stressed to the patient do not rub eyes.  Patient was advised to check Amsler Grid daily and return immediately if changes are noted. Instructions on using the grid were given to the patient. All patient questions were answered. Age-Related Macular Degeneration  Age-related macular degeneration (ARMD) is an eye disease related to aging. The disease is a problem of one of the back layers of the eye (retina) causing a loss of central vision. Central vision allows a person to see objects clearly and do daily tasks like reading and driving. There are two main types of ARMD: Dry ARMD. People with this type generally lose their vision slowly. This is the most common type of ARMD. Some people with dry ARMD notice very little change in their vision as they age. Wet ARMD. People with this type can lose their vision quickly. What are the causes? This condition is caused by damage to the part of the retina that provides you with central vision (macula). Dry ARMD happens when deposits in the macula cause light-sensitive cells to slowly break down. Wet ARMD happens when abnormal  blood vessels grow under the macula and leak blood and fluid. What increases the risk? You are more likely to develop this condition if you: Are 69 years old or older, and especially 66 years old or older. Smoke. Are obese. Have a family history of ARMD. Have high cholesterol, high blood pressure, or heart disease. Have been exposed to high levels of ultraviolet (UV) light and blue light. Are white (Caucasian). Are female. What are the signs or symptoms? Common symptoms of this condition include: Blurred vision, especially when reading print material. The blurred vision often improves in brighter light. A blurred or blind spot in the center of your field of vision that is small but growing larger. Bright colors seeming less bright than they used to be. Decreased ability to recognize and see faces. One eye seeing worse than the other. Decreased ability to adapt to dimly lit rooms. Straight lines appearing crooked or wavy. How is this diagnosed? This condition is diagnosed based on your symptoms and an eye exam. During the eye exam: Eye drops will be placed into your eyes to enlarge (dilate) your pupils. This will allow your health care provider to see the back of your eye. You may be asked to look at an image that looks like a checkerboard (Amsler grid). Early changes in your central vision may cause the grid to appear distorted. During the exam, you may be given one or both of these tests: Intravenous fluorescein angiogram. This test helps determine whether you have dry or wet ARMD. Optical coherence tomography (OCT) test to evaluate deep layers of the retina. How is this treated? There is no cure for this condition,  but treatment can help to slow down progression of ARMD. Dry ARMD is often treated with vitamin supplements. Wet ARMD may be treatable with medications placed inside the eye or laser treatments. Treatments include: Supplements, including vitamin C, vitamin E, lutein,  zeaxanthin, copper, and zinc. Injections of medicines (including anti-blood vessel growth factors and steroids) into your eye to slow down the formation of abnormal blood vessels that may leak and decrease fluid accumulation. These injections often need to be repeated on a routine basis. Laser surgery to destroy abnormal blood vessels or leaking blood vessels in your eye. Low vision aids may be very helpful if you have poor vision in both eyes from ARMD. Examples of low vision aids include magnifiers, tablet computers, and large-print products. Follow these instructions at home: Do not use any products that contain nicotine or tobacco. These products include cigarettes, chewing tobacco, and vaping devices, such as e-cigarettes. If you need help quitting, ask your health care provider. Take over-the-counter and prescription medicines only as told by your health care provider. Take vitamins and supplements as told by your health care provider. Ask your health care provider for an Amsler grid. Use it every day to check each eye for vision changes. Get an eye exam as often as told by your health care provider. Make sure to get an eye exam at least once every year. Keep all follow-up visits. This is important. Where to find support Macular Disease Society: www.macularsociety.[place] [employer]: http://reyes-guerrero.com/ Contact a health care provider if: You notice any changes in your vision. Get help right away if: You suddenly lose vision or develop pain in the eye. Summary Age-related macular degeneration (ARMD) is an eye disease related to aging. There are two types of this condition: dry ARMD and wet ARMD. This condition is caused by damage to the part of the retina that provides you with central vision (macula). While there is no cure for ARMD, treatments can slow down progression. Once diagnosed with ARMD, make sure to get an eye exam every year, take supplements and vitamins as  directed, use an Amsler grid at home, and follow up with your health care provider as directed. This information is not intended to replace advice given to you by your health care provider. Make sure you discuss any questions you have with your health care provider. Document Revised: 04/24/2021 Document Reviewed: 04/24/2021 Elsevier Patient Education  [place].

## 2022-04-30 NOTE — Assessment & Plan Note (Signed)
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  I have seen this in elderly patients mid age middle-age patients as well as in younger patients and I can concerns that the vitreomacular adhesion is causing this type of vitelliform type lesion in this patient and now disturbing the outer retinal layers and imparting blurred vision.  I discussed this with the patient vitrectomy could be undertaken which would allow the underlying retina to absorb this material.  However there is no guarantee that the absorption of that material would restore normal visual functioning or prevent vision loss.  Persistent vitelliform lesions of left alone untreated also tend to go to the same "atrophic appearance untreated over time.

## 2022-05-01 ENCOUNTER — Encounter (INDEPENDENT_AMBULATORY_CARE_PROVIDER_SITE_OTHER): Payer: Self-pay | Admitting: Ophthalmology

## 2022-05-09 DIAGNOSIS — M62838 Other muscle spasm: Secondary | ICD-10-CM | POA: Diagnosis not present

## 2022-05-09 DIAGNOSIS — M6281 Muscle weakness (generalized): Secondary | ICD-10-CM | POA: Diagnosis not present

## 2022-05-09 DIAGNOSIS — M6289 Other specified disorders of muscle: Secondary | ICD-10-CM | POA: Diagnosis not present

## 2022-05-09 DIAGNOSIS — K5900 Constipation, unspecified: Secondary | ICD-10-CM | POA: Diagnosis not present

## 2022-05-23 DIAGNOSIS — M6289 Other specified disorders of muscle: Secondary | ICD-10-CM | POA: Diagnosis not present

## 2022-05-23 DIAGNOSIS — K5900 Constipation, unspecified: Secondary | ICD-10-CM | POA: Diagnosis not present

## 2022-05-23 DIAGNOSIS — M62838 Other muscle spasm: Secondary | ICD-10-CM | POA: Diagnosis not present

## 2022-05-23 DIAGNOSIS — M6281 Muscle weakness (generalized): Secondary | ICD-10-CM | POA: Diagnosis not present

## 2022-05-27 DIAGNOSIS — M6281 Muscle weakness (generalized): Secondary | ICD-10-CM | POA: Diagnosis not present

## 2022-05-27 DIAGNOSIS — R102 Pelvic and perineal pain: Secondary | ICD-10-CM | POA: Diagnosis not present

## 2022-05-27 DIAGNOSIS — M62838 Other muscle spasm: Secondary | ICD-10-CM | POA: Diagnosis not present

## 2022-05-27 DIAGNOSIS — M6289 Other specified disorders of muscle: Secondary | ICD-10-CM | POA: Diagnosis not present

## 2022-06-19 DIAGNOSIS — M533 Sacrococcygeal disorders, not elsewhere classified: Secondary | ICD-10-CM | POA: Diagnosis not present

## 2022-07-02 DIAGNOSIS — M62838 Other muscle spasm: Secondary | ICD-10-CM | POA: Diagnosis not present

## 2022-07-02 DIAGNOSIS — K5900 Constipation, unspecified: Secondary | ICD-10-CM | POA: Diagnosis not present

## 2022-07-02 DIAGNOSIS — M6281 Muscle weakness (generalized): Secondary | ICD-10-CM | POA: Diagnosis not present

## 2022-07-02 DIAGNOSIS — M6289 Other specified disorders of muscle: Secondary | ICD-10-CM | POA: Diagnosis not present

## 2022-07-07 IMAGING — DX DG CHEST 2V
2 series · 2 of 2 positions shown · non-contrast
Comparison: March 07, 2021.

CLINICAL DATA: A 55-year-old female presents with cough and
congestion.

EXAM:
CHEST - 2 VIEW

[chest pa]
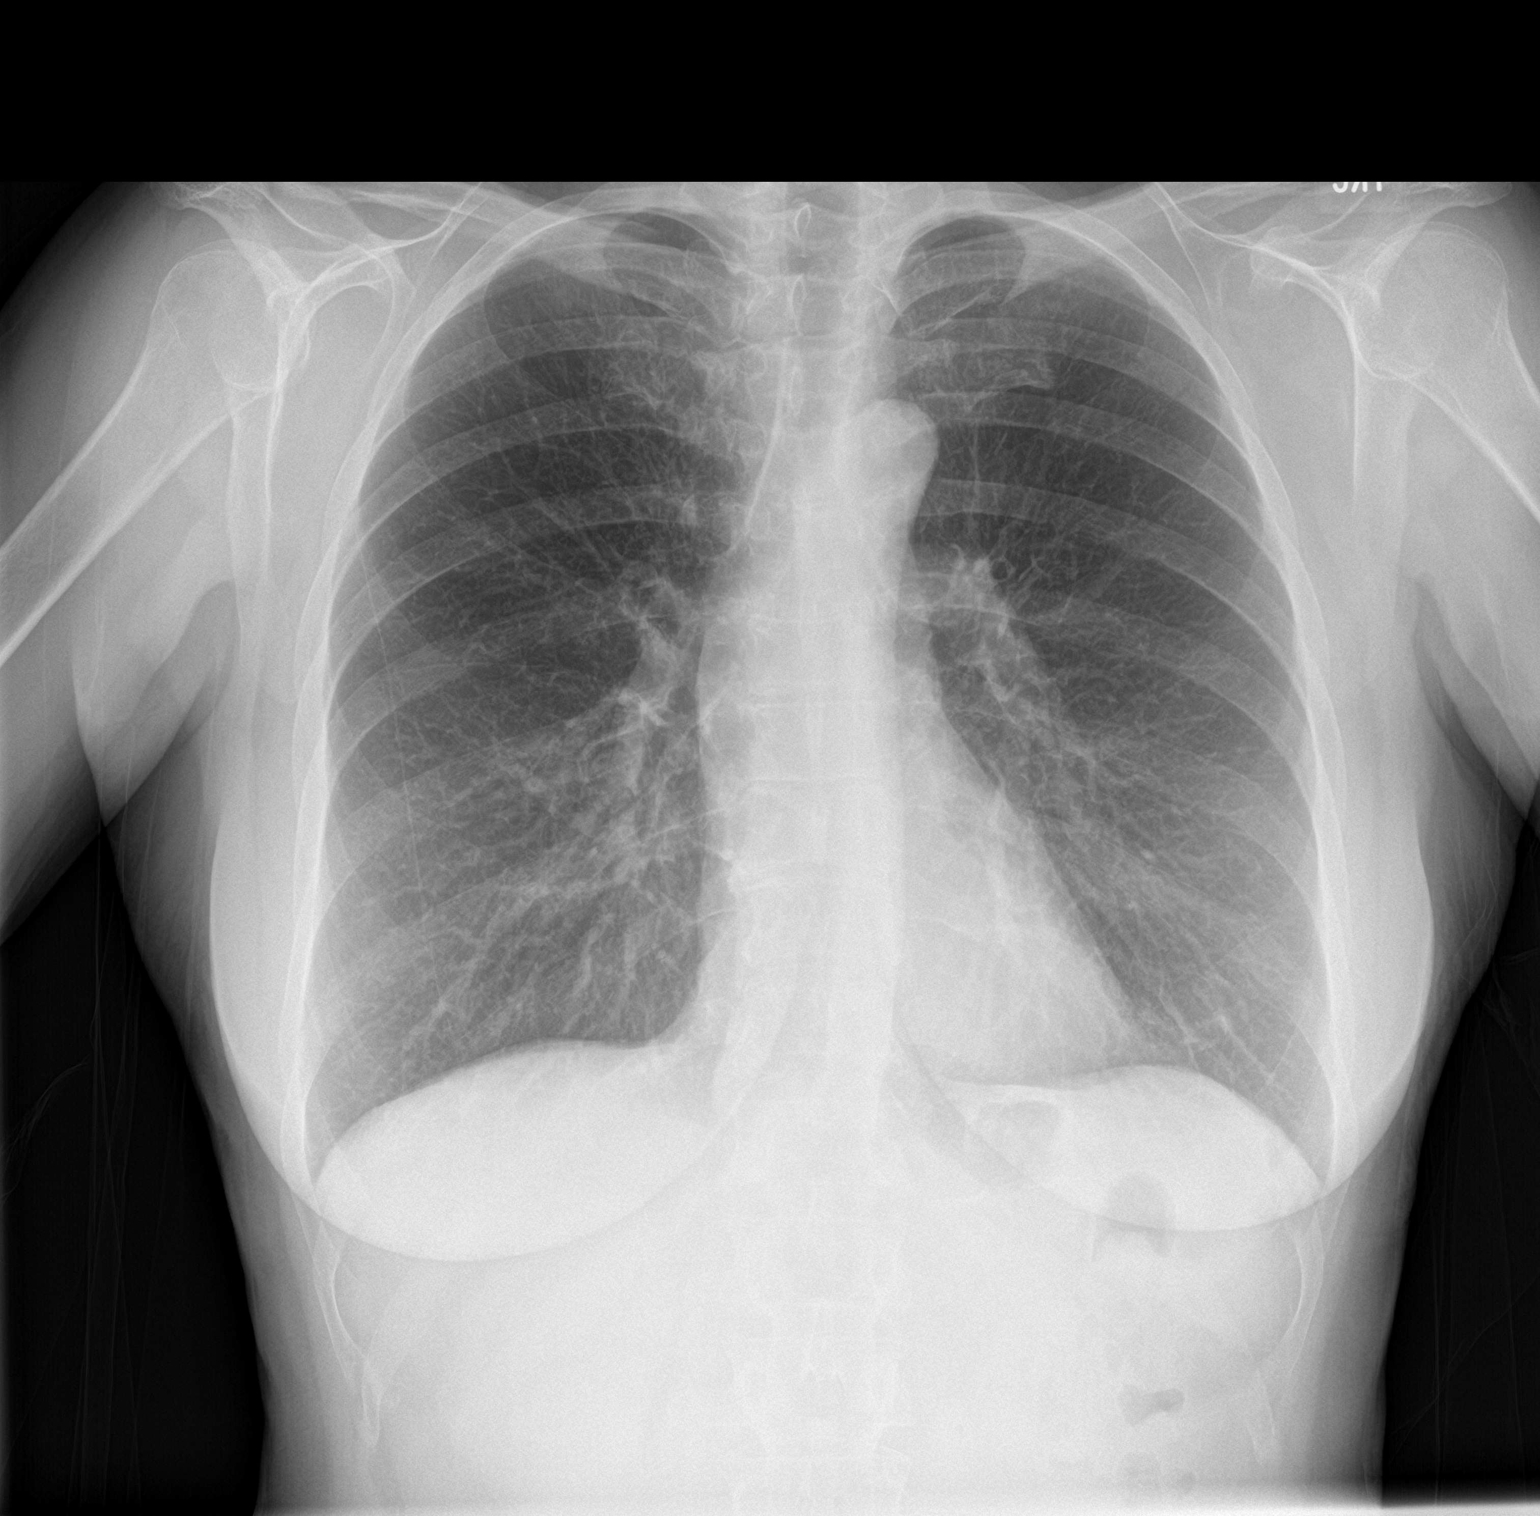

[chest lat]
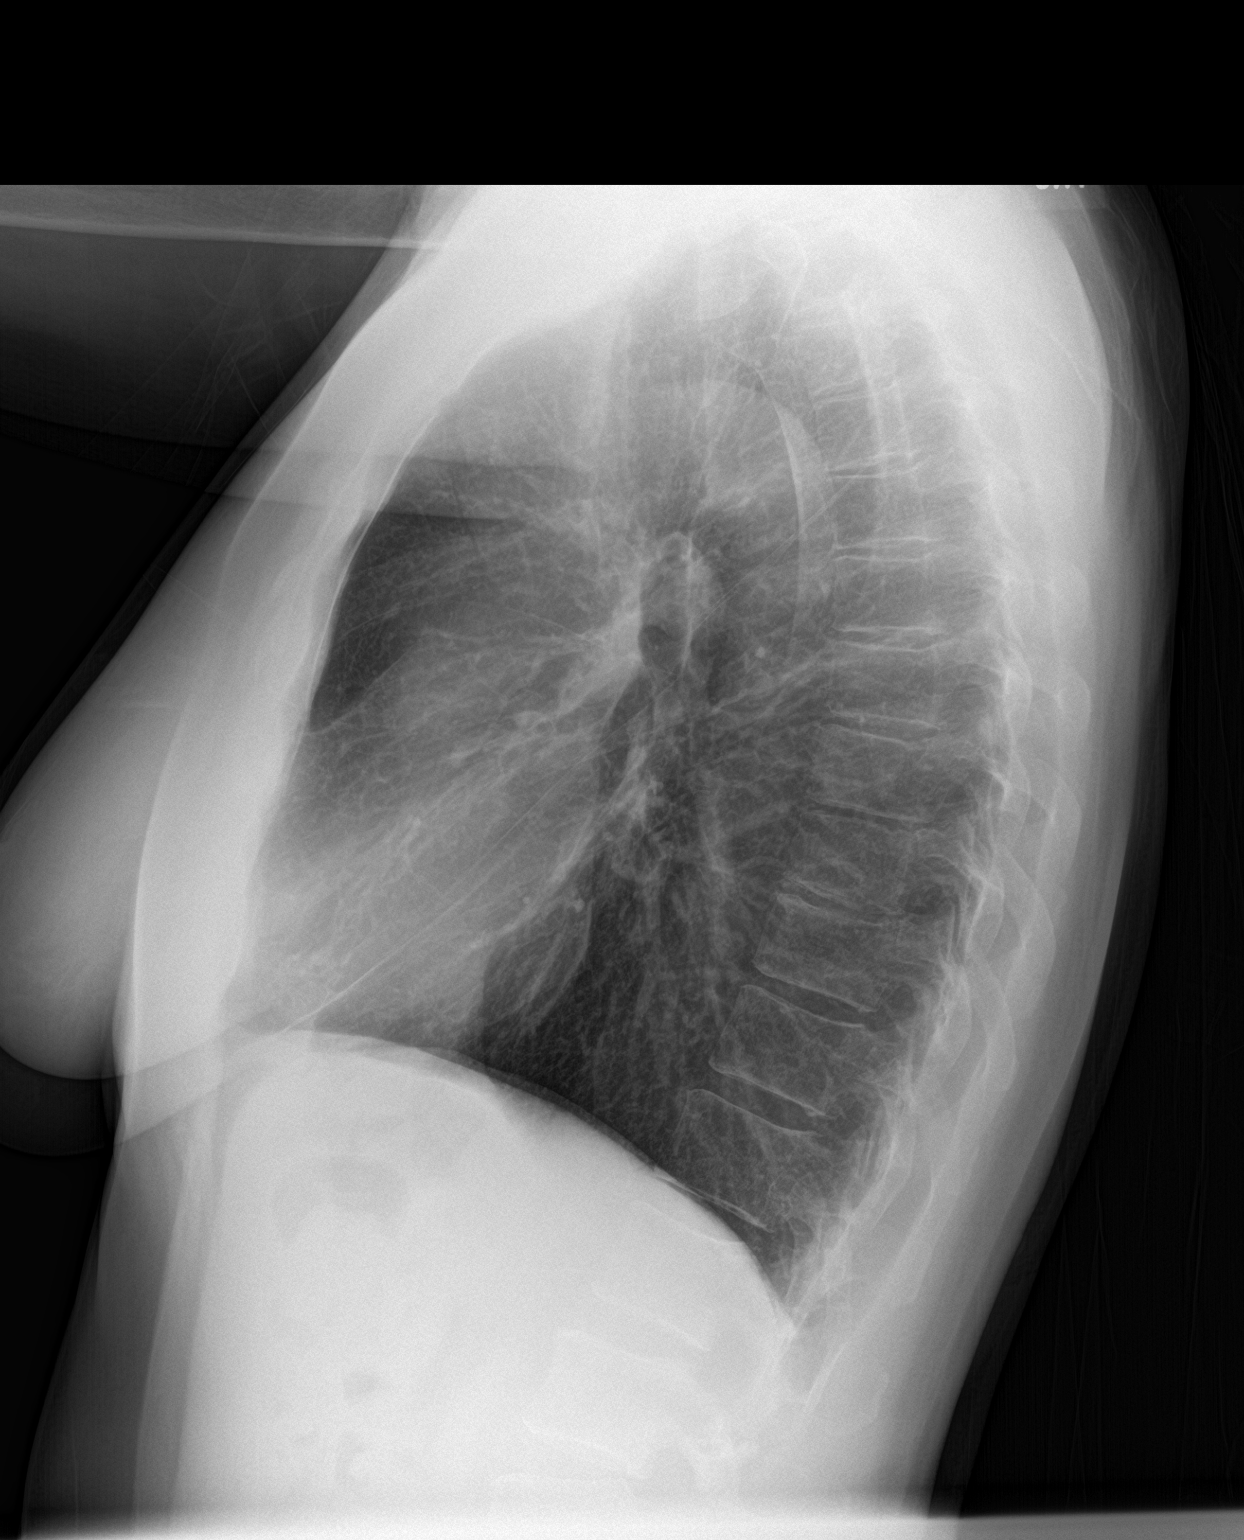

[2 of 2 positions shown; findings below may reference images not displayed]

FINDINGS: The heart size and mediastinal contours are within normal limits.
Both lungs are clear. The visualized skeletal structures are
unremarkable.
IMPRESSION: No active cardiopulmonary disease.

## 2022-07-15 DIAGNOSIS — M62838 Other muscle spasm: Secondary | ICD-10-CM | POA: Diagnosis not present

## 2022-07-15 DIAGNOSIS — M6289 Other specified disorders of muscle: Secondary | ICD-10-CM | POA: Diagnosis not present

## 2022-07-15 DIAGNOSIS — M6281 Muscle weakness (generalized): Secondary | ICD-10-CM | POA: Diagnosis not present

## 2022-07-15 DIAGNOSIS — R102 Pelvic and perineal pain: Secondary | ICD-10-CM | POA: Diagnosis not present

## 2022-07-23 DIAGNOSIS — N941 Unspecified dyspareunia: Secondary | ICD-10-CM | POA: Diagnosis not present

## 2022-07-23 DIAGNOSIS — M797 Fibromyalgia: Secondary | ICD-10-CM | POA: Diagnosis not present

## 2022-07-23 DIAGNOSIS — N3010 Interstitial cystitis (chronic) without hematuria: Secondary | ICD-10-CM | POA: Diagnosis not present

## 2022-07-23 DIAGNOSIS — G43709 Chronic migraine without aura, not intractable, without status migrainosus: Secondary | ICD-10-CM | POA: Diagnosis not present

## 2022-07-31 IMAGING — CR DG LUMBAR SPINE COMPLETE 4+V
5 series · 5 of 5 positions shown · non-contrast
Comparison: None Available.

CLINICAL DATA: Low back pain.

EXAM:
LUMBAR SPINE - COMPLETE 4+ VIEW

[t l-spine a.p.]
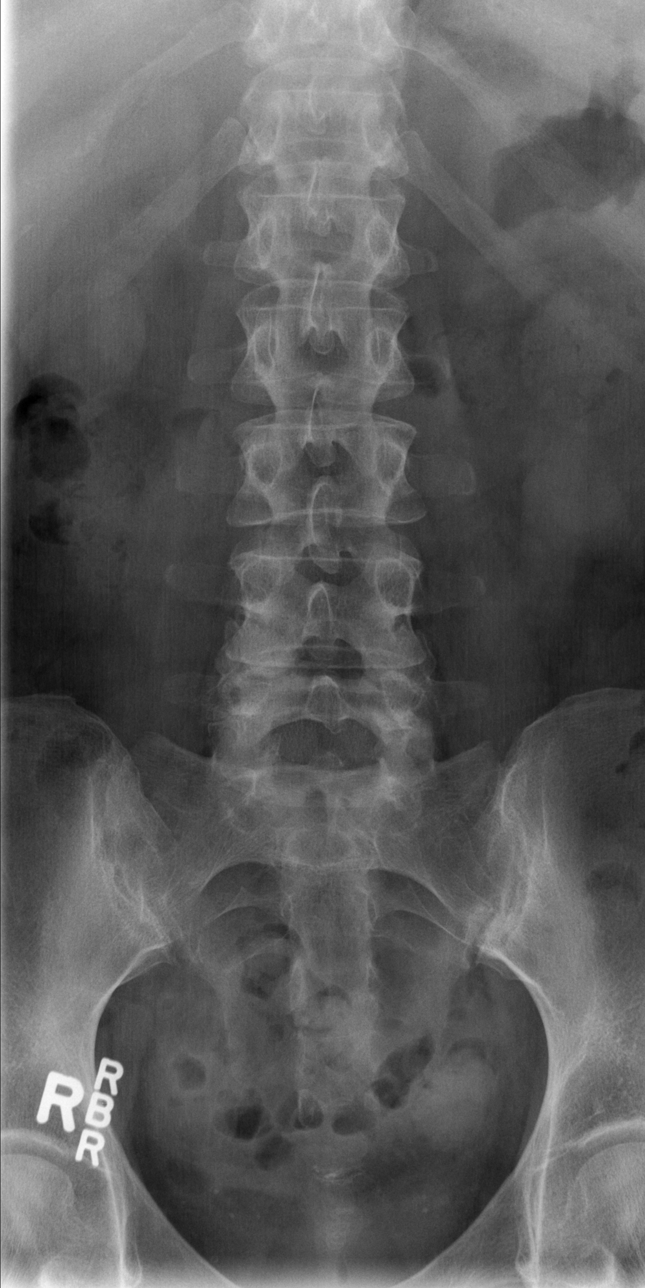

[t l-spine oblique exposure (1 of 2)]
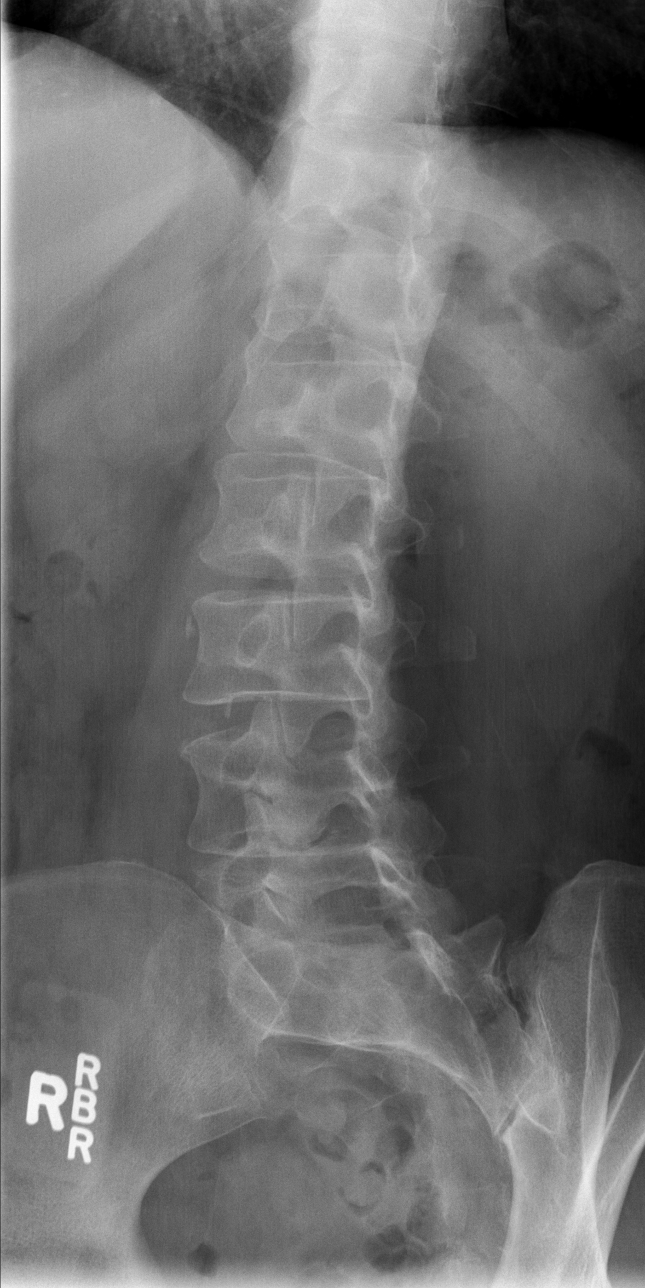

[t l-spine oblique exposure (2 of 2)]
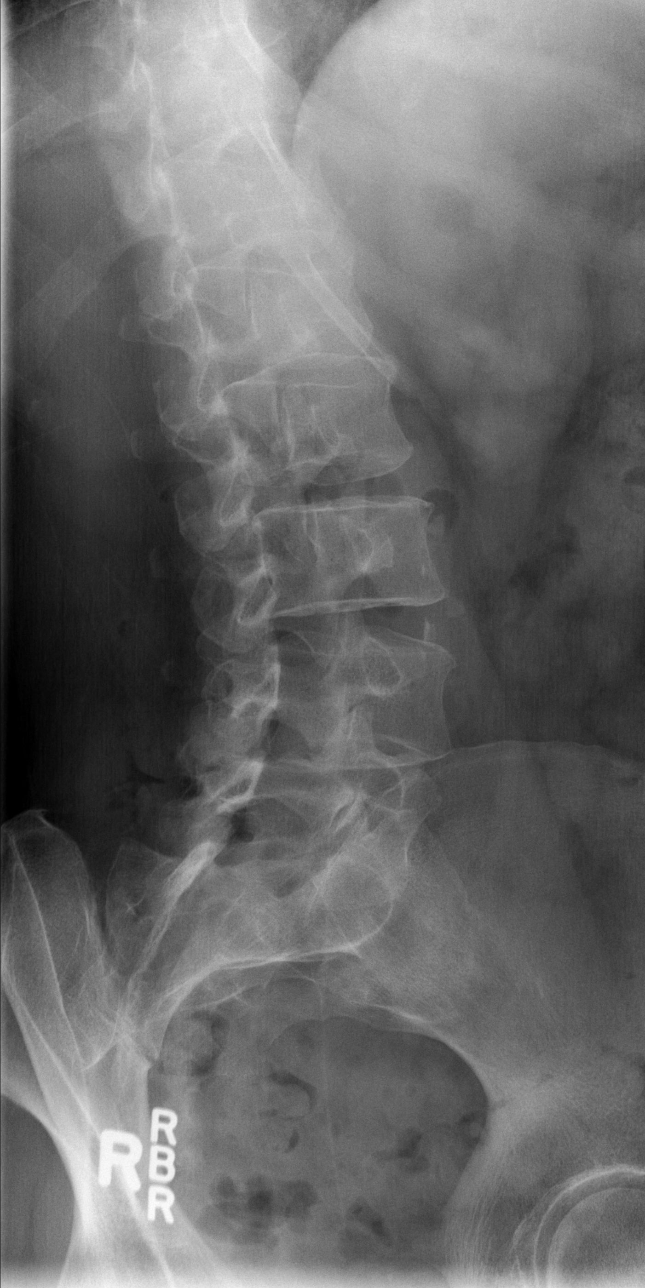

[t l-spine lat]
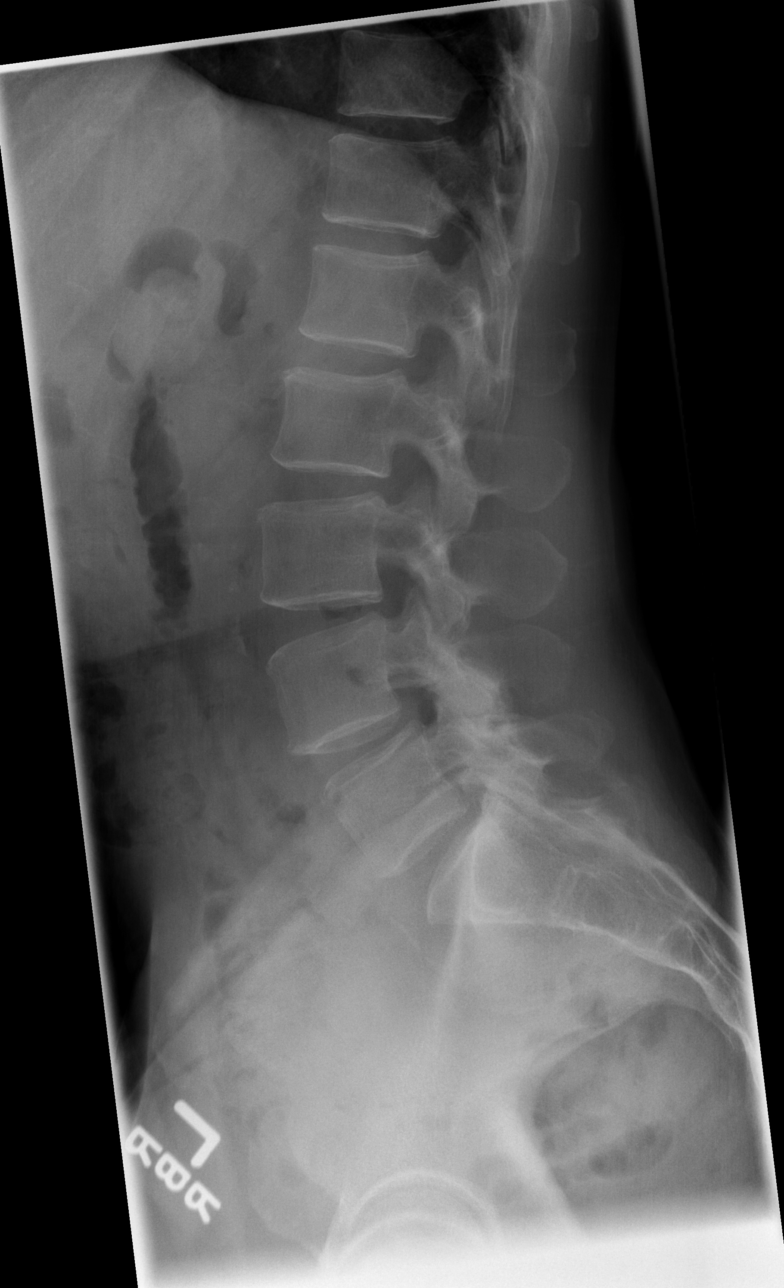

[t l-spine l5-s1 spot]
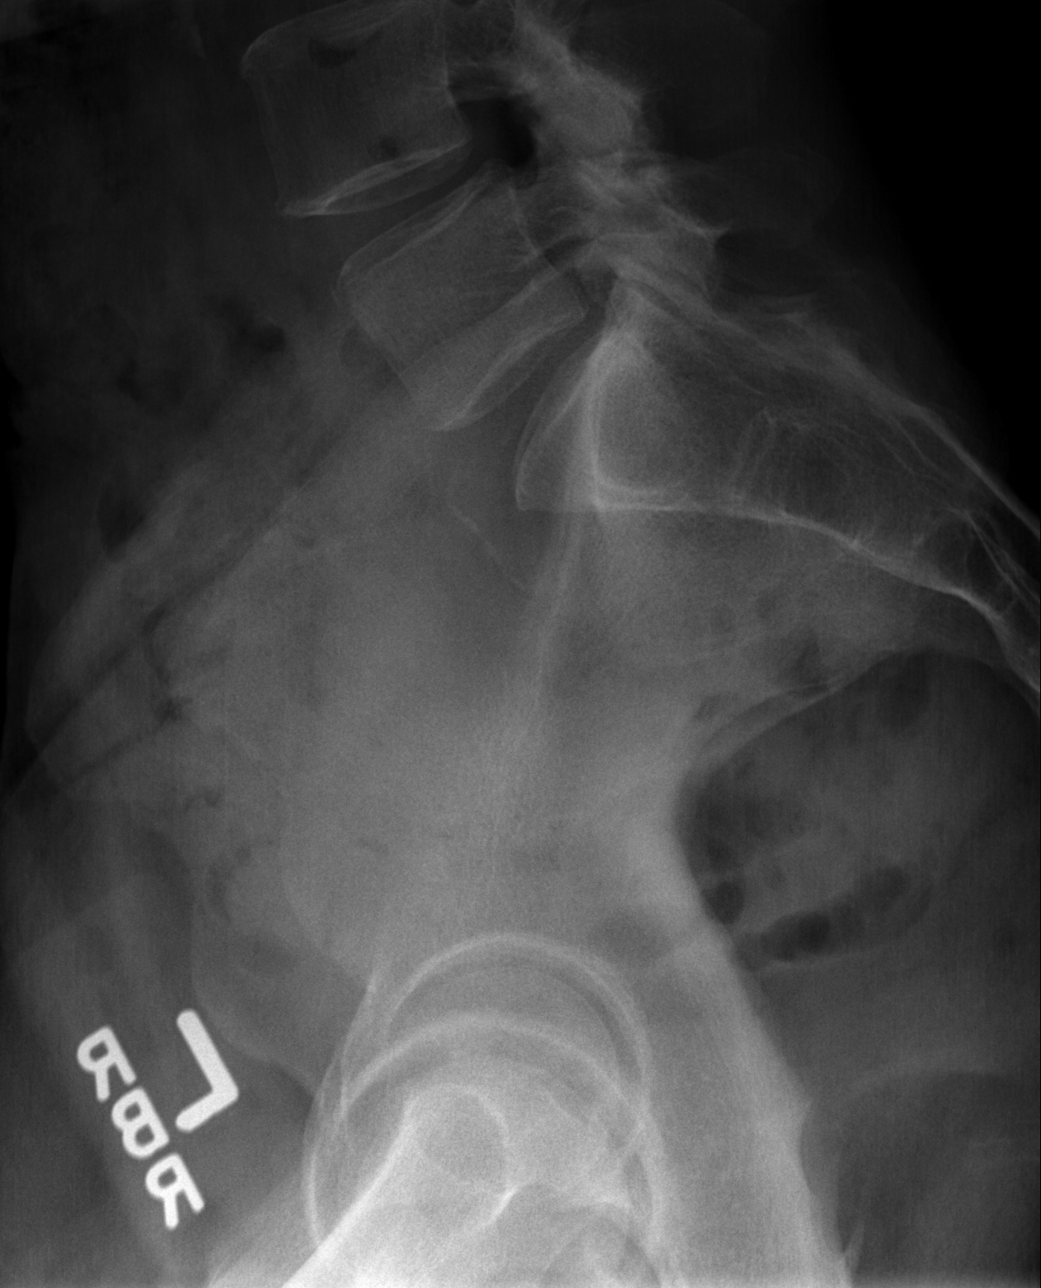

[5 of 5 positions shown; findings below may reference images not displayed]

FINDINGS: There is no evidence of lumbar spine fracture. Alignment is normal.
Intervertebral disc spaces are maintained. Mild facet joint
sclerosis is identified in the L4-5 and L5-S1 levels.
IMPRESSION: Mild degenerative joint changes of the lower lumbar spine.

## 2022-08-01 DIAGNOSIS — M6281 Muscle weakness (generalized): Secondary | ICD-10-CM | POA: Diagnosis not present

## 2022-08-01 DIAGNOSIS — M62838 Other muscle spasm: Secondary | ICD-10-CM | POA: Diagnosis not present

## 2022-08-01 DIAGNOSIS — M6289 Other specified disorders of muscle: Secondary | ICD-10-CM | POA: Diagnosis not present

## 2022-08-01 DIAGNOSIS — K5900 Constipation, unspecified: Secondary | ICD-10-CM | POA: Diagnosis not present

## 2022-08-23 ENCOUNTER — Other Ambulatory Visit: Payer: Self-pay | Admitting: Family Medicine

## 2022-09-18 DIAGNOSIS — Z01419 Encounter for gynecological examination (general) (routine) without abnormal findings: Secondary | ICD-10-CM | POA: Diagnosis not present

## 2022-09-18 DIAGNOSIS — Z131 Encounter for screening for diabetes mellitus: Secondary | ICD-10-CM | POA: Diagnosis not present

## 2022-09-18 DIAGNOSIS — Z1329 Encounter for screening for other suspected endocrine disorder: Secondary | ICD-10-CM | POA: Diagnosis not present

## 2022-09-18 DIAGNOSIS — Z1231 Encounter for screening mammogram for malignant neoplasm of breast: Secondary | ICD-10-CM | POA: Diagnosis not present

## 2022-09-18 DIAGNOSIS — Z1321 Encounter for screening for nutritional disorder: Secondary | ICD-10-CM | POA: Diagnosis not present

## 2022-09-18 DIAGNOSIS — Z13220 Encounter for screening for lipoid disorders: Secondary | ICD-10-CM | POA: Diagnosis not present

## 2022-09-19 LAB — LAB REPORT - SCANNED
A1c: 5.6
EGFR: 77

## 2022-09-20 ENCOUNTER — Other Ambulatory Visit: Payer: Self-pay | Admitting: Obstetrics and Gynecology

## 2022-09-20 DIAGNOSIS — Z9189 Other specified personal risk factors, not elsewhere classified: Secondary | ICD-10-CM

## 2022-09-23 ENCOUNTER — Other Ambulatory Visit: Payer: Self-pay | Admitting: Obstetrics and Gynecology

## 2022-09-23 DIAGNOSIS — R928 Other abnormal and inconclusive findings on diagnostic imaging of breast: Secondary | ICD-10-CM

## 2022-09-24 DIAGNOSIS — E785 Hyperlipidemia, unspecified: Secondary | ICD-10-CM | POA: Diagnosis not present

## 2022-09-26 ENCOUNTER — Other Ambulatory Visit: Payer: Self-pay | Admitting: Family Medicine

## 2022-09-27 ENCOUNTER — Ambulatory Visit
Admission: RE | Admit: 2022-09-27 | Discharge: 2022-09-27 | Disposition: A | Discharge status: Home / Self Care | Payer: Federal, State, Local not specified - PPO | Source: Ambulatory Visit | Attending: Obstetrics and Gynecology | Admitting: Obstetrics and Gynecology

## 2022-09-27 DIAGNOSIS — R928 Other abnormal and inconclusive findings on diagnostic imaging of breast: Secondary | ICD-10-CM

## 2022-09-27 DIAGNOSIS — R922 Inconclusive mammogram: Secondary | ICD-10-CM | POA: Diagnosis not present

## 2022-09-27 DIAGNOSIS — Z803 Family history of malignant neoplasm of breast: Secondary | ICD-10-CM | POA: Diagnosis not present

## 2022-10-01 DIAGNOSIS — N3010 Interstitial cystitis (chronic) without hematuria: Secondary | ICD-10-CM | POA: Diagnosis not present

## 2022-10-01 DIAGNOSIS — N9489 Other specified conditions associated with female genital organs and menstrual cycle: Secondary | ICD-10-CM | POA: Diagnosis not present

## 2022-10-01 DIAGNOSIS — K582 Mixed irritable bowel syndrome: Secondary | ICD-10-CM | POA: Diagnosis not present

## 2022-10-01 DIAGNOSIS — M797 Fibromyalgia: Secondary | ICD-10-CM | POA: Diagnosis not present

## 2022-10-07 DIAGNOSIS — L570 Actinic keratosis: Secondary | ICD-10-CM | POA: Diagnosis not present

## 2022-10-07 DIAGNOSIS — D225 Melanocytic nevi of trunk: Secondary | ICD-10-CM | POA: Diagnosis not present

## 2022-10-07 DIAGNOSIS — D485 Neoplasm of uncertain behavior of skin: Secondary | ICD-10-CM | POA: Diagnosis not present

## 2022-10-07 DIAGNOSIS — X32XXXD Exposure to sunlight, subsequent encounter: Secondary | ICD-10-CM | POA: Diagnosis not present

## 2022-10-07 DIAGNOSIS — D229 Melanocytic nevi, unspecified: Secondary | ICD-10-CM | POA: Diagnosis not present

## 2022-10-07 DIAGNOSIS — L820 Inflamed seborrheic keratosis: Secondary | ICD-10-CM | POA: Diagnosis not present

## 2022-10-07 DIAGNOSIS — L821 Other seborrheic keratosis: Secondary | ICD-10-CM | POA: Diagnosis not present

## 2022-10-08 ENCOUNTER — Encounter: Payer: Self-pay | Admitting: Family Medicine

## 2022-10-08 DIAGNOSIS — H16223 Keratoconjunctivitis sicca, not specified as Sjogren's, bilateral: Secondary | ICD-10-CM | POA: Diagnosis not present

## 2022-10-08 DIAGNOSIS — H353231 Exudative age-related macular degeneration, bilateral, with active choroidal neovascularization: Secondary | ICD-10-CM | POA: Diagnosis not present

## 2022-10-08 DIAGNOSIS — H5713 Ocular pain, bilateral: Secondary | ICD-10-CM | POA: Diagnosis not present

## 2022-10-08 MED ORDER — VENLAFAXINE HCL ER 75 MG PO CP24: 75.0000 mg | ORAL_CAPSULE | Freq: Every day | ORAL | 0 refills | Status: DC

## 2022-10-10 ENCOUNTER — Other Ambulatory Visit: Payer: Federal, State, Local not specified - PPO

## 2022-10-11 ENCOUNTER — Ambulatory Visit: Payer: Federal, State, Local not specified - PPO | Admitting: Nurse Practitioner

## 2022-10-16 ENCOUNTER — Ambulatory Visit
Admission: RE | Admit: 2022-10-16 | Discharge: 2022-10-16 | Disposition: A | Discharge status: Home / Self Care | Payer: Federal, State, Local not specified - PPO | Source: Ambulatory Visit | Attending: Obstetrics and Gynecology | Admitting: Obstetrics and Gynecology

## 2022-10-16 DIAGNOSIS — N6012 Diffuse cystic mastopathy of left breast: Secondary | ICD-10-CM | POA: Diagnosis not present

## 2022-10-16 DIAGNOSIS — N6011 Diffuse cystic mastopathy of right breast: Secondary | ICD-10-CM | POA: Diagnosis not present

## 2022-10-16 DIAGNOSIS — Z9189 Other specified personal risk factors, not elsewhere classified: Secondary | ICD-10-CM

## 2022-10-16 MED ORDER — GADOPICLENOL 0.5 MMOL/ML IV SOLN
5.0000 mL | Freq: Once | INTRAVENOUS | Status: AC | PRN
  Administered 2022-10-16: 5 mL via INTRAVENOUS

## 2022-10-31 ENCOUNTER — Encounter (INDEPENDENT_AMBULATORY_CARE_PROVIDER_SITE_OTHER): Payer: Federal, State, Local not specified - PPO | Admitting: Ophthalmology

## 2022-11-07 ENCOUNTER — Other Ambulatory Visit (INDEPENDENT_AMBULATORY_CARE_PROVIDER_SITE_OTHER): Payer: Federal, State, Local not specified - PPO

## 2022-11-07 ENCOUNTER — Ambulatory Visit (INDEPENDENT_AMBULATORY_CARE_PROVIDER_SITE_OTHER): Payer: Federal, State, Local not specified - PPO | Admitting: Nurse Practitioner

## 2022-11-07 ENCOUNTER — Encounter: Payer: Self-pay | Admitting: Nurse Practitioner

## 2022-11-07 VITALS — BP 116/76 | HR 80 | Ht 64.0 in | Wt 121.4 lb

## 2022-11-07 DIAGNOSIS — K612 Anorectal abscess: Secondary | ICD-10-CM | POA: Diagnosis not present

## 2022-11-07 DIAGNOSIS — K602 Anal fissure, unspecified: Secondary | ICD-10-CM

## 2022-11-07 LAB — COMPREHENSIVE METABOLIC PANEL
ALT: 10 U/L (ref 0–35)
AST: 17 U/L (ref 0–37)
Albumin: 5 g/dL (ref 3.5–5.2)
Alkaline Phosphatase: 94 U/L (ref 39–117)
BUN: 13 mg/dL (ref 6–23)
CO2: 26 mEq/L (ref 19–32)
Calcium: 9.7 mg/dL (ref 8.4–10.5)
Chloride: 104 mEq/L (ref 96–112)
Creatinine, Ser: 0.82 mg/dL (ref 0.40–1.20)
GFR: 80.98 mL/min (ref >60.00)
Glucose, Bld: 87 mg/dL (ref 70–99)
Potassium: 3.6 mEq/L (ref 3.5–5.1)
Sodium: 139 mEq/L (ref 135–145)
Total Bilirubin: 0.6 mg/dL (ref 0.2–1.2)
Total Protein: 8 g/dL (ref 6.0–8.3)

## 2022-11-07 LAB — CBC WITH DIFFERENTIAL/PLATELET
Basophils Absolute: 0 10*3/uL (ref 0.0–0.1)
Basophils Relative: 0.5 % (ref 0.0–3.0)
Eosinophils Absolute: 0.1 10*3/uL (ref 0.0–0.7)
Eosinophils Relative: 1.4 % (ref 0.0–5.0)
HCT: 42.3 % (ref 36.0–46.0)
Hemoglobin: 14.1 g/dL (ref 12.0–15.0)
Lymphocytes Relative: 34.2 % (ref 12.0–46.0)
Lymphs Abs: 2.7 10*3/uL (ref 0.7–4.0)
MCHC: 33.3 g/dL (ref 30.0–36.0)
MCV: 91.8 fl (ref 78.0–100.0)
Monocytes Absolute: 0.6 10*3/uL (ref 0.1–1.0)
Monocytes Relative: 7 % (ref 3.0–12.0)
Neutro Abs: 4.5 10*3/uL (ref 1.4–7.7)
Neutrophils Relative %: 56.9 % (ref 43.0–77.0)
Platelets: 299 10*3/uL (ref 150.0–400.0)
RBC: 4.6 Mil/uL (ref 3.87–5.11)
RDW: 13.7 % (ref 11.5–15.5)
WBC: 7.9 10*3/uL (ref 4.0–10.5)

## 2022-11-07 LAB — C-REACTIVE PROTEIN: CRP: <1 mg/dL (ref 0.5–20.0)

## 2022-11-07 MED ORDER — METRONIDAZOLE 500 MG PO TABS: 500.0000 mg | ORAL_TABLET | Freq: Two times a day (BID) | ORAL | 0 refills | Status: DC

## 2022-11-07 MED ORDER — CIPROFLOXACIN HCL 500 MG PO TABS: 500.0000 mg | ORAL_TABLET | Freq: Two times a day (BID) | ORAL | 0 refills | Status: DC

## 2022-11-07 NOTE — Progress Notes (Signed)
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 She is known by Dr. Ardis Hughs.  She was last seen in office by Tye Savoy, NP 03/04/2022 with r rectal pain and intermittent bleeding.  She was suspected to have an anal fissure for which she was prescribed Nitroglycerin ointment and her symptoms improved.   She presents today with complaints of moderate rectal pain with Actal bleeding.  She started passing small amount of bright red and dark red blood per the rectum 1 week ago.  Monday or Tuesday this week she passed a bowel movement with a large amount of bright red and dark red blood with clots.  She has chronic lower abdominal pain which has not worsened over the past week or 2.  Or anorectal pain has worsened.  She describes seeing puslike discharge on her undergarment and on the toilet tissue which comes and goes for the past 1 to 2 months.  Her bowel movements vary, she passes a solid, soft or loose stool daily.  Stools are brown-yellowish.  No black stools.  She sometimes passes mucus with blood streaks per the rectum.  No fever, sweats or chills.  No NSAID use.   PAST GI PROCEDURES:  Colonoscopy 11/06/2018: - The examined portion of the ileum was normal. Biopsied. jar 1 - Five 3 to 4 mm polyps in the transverse colon, in the cecum and at the appendiceal orifice, removed with a cold snare. Resected and retrieved. jar 2 - The colonic mucosa was possibly slightly granular throughout and was randomly biospied. jar 3. - The examination was otherwise normal on direct and retroflexion views.   1. Surgical [P], terminal ileum BX - BENIGN SMALL BOWEL MUCOSA. - NO ACTIVE INFLAMMATION. - NO DYSPLASIA OR MALIGNANCY. 2. Surgical [P], ascending and cecum, transverse, polyp (5) - TUBULAR ADENOMA (X8 FRAGMENTS). - NO HIGH GRADE DYSPLASIA OR MALIGNANCY. 3. Surgical [P], random sites - BENIGN COLONIC MUCOSA. - NO ACTIVE INFLAMMATION OR EVIDENCE OF MICROSCOPIC COLITIS. - NO DYSPLASIA OR MALIGNANCY   - The examined portion of the ileum was normal. - Five 2 to 5 mm polyps in the sigmoid colon, in the descending colon and in the transverse colon, removed with a cold snare. Resected and retrieved. - The examination was otherwise normal on direct and retroflexion views.  Surgical [P], colon, sigmoid, descending and transverse, polyp (5) -3-year colonoscopy recall TUBULAR ADENOMAS NEGATIVE FOR HIGH-GRADE DYSPLASIA AND CARCINOMA  Current Outpatient Medications on File Prior to Visit  Medication Sig Dispense Refill   ALPRAZolam (XANAX) 0.5 MG tablet TAKE 1 TABLET(0.5 MG) BY MOUTH THREE TIMES DAILY AS NEEDED (Patient taking differently: Take 0.5 mg by mouth 3 (three) times daily as needed for anxiety.) 90 tablet 0   Cyanocobalamin (VITAMIN B-12 PO) Take 5,000 mcg by mouth daily.     diltiazem 2 % GEL Apply 1 Application topically 3 (three) times daily. As directed for 6 weeks. 1 Package 0   rosuvastatin (CRESTOR) 5 MG tablet Take 1 tablet by mouth daily.     tiZANidine (ZANAFLEX) 4 MG tablet TAKE 1 TABLET BY MOUTH TWICE DAILY AS NEEDED 60 tablet  1   topiramate (TOPAMAX) 100 MG tablet TAKE 1 TABLET(100 MG) BY MOUTH DAILY 30 tablet 2   venlafaxine XR (EFFEXOR-XR) 75 MG 24 hr capsule Take 1 capsule (75 mg total) by mouth daily with breakfast. 90 capsule 0   Vitamin D, Ergocalciferol, (DRISDOL) 1.25 MG (50000 UNIT) CAPS capsule TAKE 1 CAPSULE BY MOUTH ONCE WEEKLY 12 capsule 1   traZODone (DESYREL) 50 MG tablet 100 mg. (Patient not taking: Reported on 11/07/2022)     triamcinolone cream (KENALOG)  0.1 % Apply 1 application. topically 2 (two) times daily. (Patient not taking: Reported on 11/07/2022) 45 g 3   No current facility-administered medications on file prior to visit.   Allergies  Allergen Reactions   Ampicillin    Cymbalta [Duloxetine Hcl] Nausea Only    Constipation, bloating   Doxycycline Diarrhea   Fentanyl Citrate Nausea And Vomiting    fever   Penicillins    Current Medications, Allergies, Past Medical History, Past Surgical History, Family History and Social History were reviewed in [facility] record.  Review of Systems:   Constitutional: Negative for fever, sweats, chills or weight loss.  Respiratory: Negative for shortness of breath.   Cardiovascular: Negative for chest pain, palpitations and leg swelling.  Gastrointestinal: See HPI.  Musculoskeletal: Negative for back pain or muscle aches.  Neurological: Negative for dizziness, headaches or paresthesias.   Physical Exam: Ht 5' 4"$  (1.626 m)   Wt 121 lb 6.4 oz (55.1 kg)   LMP  (LMP Unknown)   BMI 20.84 kg/m  General: 57 year old female in no acute distress. Head: Normocephalic and atraumatic. Eyes: No scleral icterus. Conjunctiva pink . Ears: Normal auditory acuity. Mouth: Dentition intact. No ulcers or lesions.  Lungs: Clear throughout to auscultation. Heart: Regular rate and rhythm, no murmur. Abdomen: Soft, nondistended.  Mild lower abdominal tenderness without rebound or guarding.  No masses or hepatomegaly. Normal bowel sounds x 4 quadrants.  Rectal: Posterior fistula opening vs fissure without active bleeding or exudate, swollen internal right lateral anorectal area with moderate tenderness. No blood or stool in the rectal vault. No prolapsed hemorrhoids. Anterior anal fold/small external hemorrhoids without inflammation or fissure to this area. Loysie CMA present during exam. Musculoskeletal: Symmetrical with no gross deformities. Extremities: No edema. Neurological: Alert  oriented x 4. No focal deficits.  Psychological: Alert and cooperative. Normal mood and affect  Assessment and Recommendations:  56 year old female with anorectal pain, rectal bleeding and intermittent purulent anorectal discharge, suspect anorectal abscess secondary to anal fissure vs anorectal Crohn's disease.  -Pelvic MRI w/wo contrast scheduled tomorrow -CBC, CMP, CRP -Cipro 513m one po bid x 10 days, Flagyl 5024mone po bid x 10 days. Patient is allergic to PCN.  -Refer to colorectal surgery if MRI confirms anorectal abscess -May require future flexible sigmoidoscopy  -Patient to contact office if symptoms worsen -MiraLAX nightly as tolerated to facilitate easy bowel movements  Remote history of Crohn's ileocolitis 2005, not seen on subsequent colonoscopies.   History of recurrent adenomatous colon polyps including removal of 5 small adenomas on May 2023 colonoscopy.   -Next colonoscopy due 01/2025   Chronic abdominopelvic pain / interstitial cystitis, known high tone pelvic floor dysfunction      Alpha-1 carrier state with normal levels. Followed by Pulmonary. No liver abnormalities on CT scan in 2020.  Normal LFTs 09/2022.

## 2022-11-07 NOTE — Patient Instructions (Signed)
You have been scheduled for an MRI at [facility] on tomorrow. Your appointment time is 9 am. Please arrive to admitting 30 minutes prior to your appointment time for registration purposes. Please make certain not to have anything to eat or drink 6 hours prior to your test. In addition, if you have any metal in your body, have a pacemaker or defibrillator, please be sure to let your ordering physician know. This test typically takes 45 minutes to 1 hour to complete. Should you need to reschedule, please call [phone] to do so.   We have sent the following medications to your pharmacy for you to pick up at your convenience: Flagyl & Cipro  Miralax- every night as needed  Due to recent changes in healthcare laws, you may see the results of your imaging and laboratory studies on MyChart before your provider has had a chance to review them.  We understand that in some cases there may be results that are confusing or concerning to you. Not all laboratory results come back in the same time frame and the provider may be waiting for multiple results in order to interpret others.  Please give [place] 48 hours in order for your provider to thoroughly review all the results before contacting the office for clarification of your results.   Thank you for trusting me with your gastrointestinal care!   Carl Best, CRNP

## 2022-11-08 ENCOUNTER — Ambulatory Visit (HOSPITAL_BASED_OUTPATIENT_CLINIC_OR_DEPARTMENT_OTHER)
Admission: RE | Admit: 2022-11-08 | Discharge: 2022-11-08 | Disposition: A | Discharge status: Home / Self Care | Payer: Federal, State, Local not specified - PPO | Source: Ambulatory Visit | Attending: Nurse Practitioner | Admitting: Nurse Practitioner

## 2022-11-08 DIAGNOSIS — K5090 Crohn's disease, unspecified, without complications: Secondary | ICD-10-CM | POA: Diagnosis not present

## 2022-11-08 DIAGNOSIS — K602 Anal fissure, unspecified: Secondary | ICD-10-CM

## 2022-11-08 DIAGNOSIS — K612 Anorectal abscess: Secondary | ICD-10-CM | POA: Diagnosis not present

## 2022-11-08 MED ORDER — GADOBUTROL 1 MMOL/ML IV SOLN
5.5000 mL | Freq: Once | INTRAVENOUS | Status: AC | PRN
  Administered 2022-11-08: 5.5 mL via INTRAVENOUS
  Filled 2022-11-08: qty 6

## 2022-11-08 NOTE — Progress Notes (Signed)
____________________________________________________________  Attending physician addendum:  Thank you for sending this case to me. I have reviewed the entire note and agree with the plan.  Thank you for getting the MRI so quickly and sending me the report today.  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  This probably accounts for the reported distal rectal wall thickening on MRI, though I agree that may also be an artifact of imaging with a contracted rectum.  I recommend keeping her on the antibiotics that you prescribed and sending an ASAP referral to colorectal surgery.  I will call one of our colorectal surgeons as well he knows they may be able to see her next week.  Wilfrid Lund, MD  ____________________________________________________________

## 2022-11-11 DIAGNOSIS — K602 Anal fissure, unspecified: Secondary | ICD-10-CM | POA: Diagnosis not present

## 2022-11-18 ENCOUNTER — Telehealth: Payer: Self-pay | Admitting: Nurse Practitioner

## 2022-11-18 NOTE — Telephone Encounter (Signed)
Dr. Loletha Carrow, I called patient for update, refer to office visit 11/07/2022. Patient was seen by P. Maczis colorectal surgery PA 11/11/2022 with instructions to continue Diltiazem/Lidocaine fissure cream, utilize warm soaks, take fiber and follow up with Dr. Dema Severin in 3 weeks for suspected posterior anal fissure, appointment not yet scheduled. Patient stated she is almost done with the course of Cipro/Flagyl and she continues to use the fissure cream tid. Her rectal pain slightly improved but increases after multiple bowel movements. No further rectal bleeding or obvious purulent anorectal discharge. Dr. Loletha Carrow, let me know your recommendations and I will contact patient. THX

## 2022-11-18 NOTE — Telephone Encounter (Signed)
She needs to be seen by the colorectal physician.  I will message Dr. Dema Severin and ask him to expedite that.  Meanwhile, she can certainly continue using recticare or other lidocaine ointment before and after BMs.  - HD

## 2022-11-26 ENCOUNTER — Telehealth: Payer: Self-pay | Admitting: Gastroenterology

## 2022-11-26 DIAGNOSIS — K602 Anal fissure, unspecified: Secondary | ICD-10-CM | POA: Diagnosis not present

## 2022-11-26 DIAGNOSIS — K50911 Crohn's disease, unspecified, with rectal bleeding: Secondary | ICD-10-CM | POA: Diagnosis not present

## 2022-11-26 NOTE — Telephone Encounter (Signed)
Sheila [name] recently saw this patient of Dr. Ardis Hughs for anal/rectal pain.  She was evaluated by Dr. Dema Severin of colorectal surgery, and he is requesting a colonoscopy to rule out active Crohn's disease. This patient had a colonoscopy with Dr. Ardis Hughs in May 2023 showing no active Crohn's disease, but the symptoms came on afterward.  Please contact this patient and offer her my next available slot for colonoscopy (Suprep)  I have openings in my [facility] schedule next week and the week after.  You can most likely send the prep instructions to her by MyChart or she can come by the office to pick them up.  She has had prior colonoscopies, so should not need much assistance with the instructions.  Thank you   - HD

## 2022-11-27 ENCOUNTER — Other Ambulatory Visit: Payer: Self-pay

## 2022-11-27 DIAGNOSIS — Z8719 Personal history of other diseases of the digestive system: Secondary | ICD-10-CM

## 2022-11-27 DIAGNOSIS — K6289 Other specified diseases of anus and rectum: Secondary | ICD-10-CM

## 2022-11-27 MED ORDER — NA SULFATE-K SULFATE-MG SULF 17.5-3.13-1.6 GM/177ML PO SOLN: ORAL | 0 refills | Status: DC

## 2022-11-27 NOTE — Telephone Encounter (Signed)
Pt made aware of Dr. Dema Severin and Dr. Loletha Carrow recommendations: Pt stated that she would like to wait until April to be scheduled to prepare herself mentally and physically and she states that she has effects from the anaesthesia from her autoimmune conditions and that she wants to ensure that she can enjoy her easter.   Pt Colonoscopy  was scheduled on 12/23/2022 at 10:00 AM with Dr. Loletha Carrow in the [facility]. Pt made aware Prep sent to pharmacy: Pt made aware. Prep instructions were sent to pt via my chart.Pt made aware. Ambulatory referral to GI placed in Epic: Pt verbalized understanding with all questions answered.

## 2022-12-12 ENCOUNTER — Encounter: Payer: Self-pay | Admitting: Gastroenterology

## 2022-12-22 ENCOUNTER — Encounter: Payer: Self-pay | Admitting: Certified Registered Nurse Anesthetist

## 2022-12-23 ENCOUNTER — Ambulatory Visit (AMBULATORY_SURGERY_CENTER): Payer: Federal, State, Local not specified - PPO | Admitting: Gastroenterology

## 2022-12-23 ENCOUNTER — Encounter: Payer: Self-pay | Admitting: Gastroenterology

## 2022-12-23 VITALS — BP 119/71 | HR 73 | Temp 97.8°F | Resp 12 | Ht 64.0 in | Wt 121.0 lb

## 2022-12-23 DIAGNOSIS — K6289 Other specified diseases of anus and rectum: Secondary | ICD-10-CM | POA: Diagnosis not present

## 2022-12-23 DIAGNOSIS — D122 Benign neoplasm of ascending colon: Secondary | ICD-10-CM | POA: Diagnosis not present

## 2022-12-23 DIAGNOSIS — D123 Benign neoplasm of transverse colon: Secondary | ICD-10-CM

## 2022-12-23 MED ORDER — SODIUM CHLORIDE 0.9 % IV SOLN: 500.0000 mL | Freq: Once | INTRAVENOUS | Status: DC

## 2022-12-23 MED ORDER — AMBULATORY NON FORMULARY MEDICATION: 0 refills | Status: DC

## 2022-12-23 NOTE — Progress Notes (Signed)
Report given to PACU, vss 

## 2022-12-23 NOTE — Progress Notes (Signed)
Called to room to assist during endoscopic procedure.  Patient ID and intended procedure confirmed with present staff. Received instructions for my participation in the procedure from the performing physician.  

## 2022-12-23 NOTE — Progress Notes (Signed)
History and Physical:  This patient presents for endoscopic testing for: Encounter Diagnosis  Name Primary?   Rectal pain Yes    Clinical details in 11/09/22 office note and subsequent phone and surgical office notes. Anorectal pain, treated empirically for possible fissure.  MRI pelvis without abscess - showed possible distal rectal wall thickening. Hx IC and pelvic floor dysfunction. C/o loose stool for months, alternating with constipation.  No active IBD to TI on colonoscopy May 2023 Christella Hartigan)  Patient is otherwise without complaints or active issues today.   Past Medical History: Past Medical History:  Diagnosis Date   AAT (alpha-1-antitrypsin) deficiency    Anal fissure    Anxiety    Asthma    uses daily   Cancer    per gentic test   Chronic interstitial cystitis    Crohn disease    Fibromyalgia    Migraines    Pneumonia 2007   SLE (systemic lupus erythematosus)      Past Surgical History: Past Surgical History:  Procedure Laterality Date   adnoids     BLADDER SURGERY     "couple"   CESAREAN SECTION     COLONOSCOPY     CYSTOSCOPY     KNEE ARTHROSCOPY     "COUPLE"   LAPAROSCOPIC VAGINAL HYSTERECTOMY WITH SALPINGO OOPHORECTOMY Bilateral 01/24/2020   Procedure: LAPAROSCOPIC ASSISTED VAGINAL HYSTERECTOMY WITH BILATERAL OOPHORECTOMY AND LEFT  SALPINGECTOMY;  Surgeon: Richardean Chimera, MD;  Location: [facility];  Service: Gynecology;  Laterality: Bilateral;  need bed   SALPINGECTOMY Right    right   TONSILLECTOMY     WRIST SURGERY Left    x 2    Allergies: Allergies  Allergen Reactions   Ampicillin    Cymbalta [Duloxetine Hcl] Nausea Only    Constipation, bloating   Doxycycline Diarrhea   Fentanyl Citrate Nausea And Vomiting    fever   Penicillins     Outpatient Meds: Current Outpatient Medications  Medication Sig Dispense Refill   ALPRAZolam (XANAX) 0.5 MG tablet TAKE 1 TABLET(0.5 MG) BY MOUTH THREE TIMES DAILY AS NEEDED (Patient  taking differently: Take 0.5 mg by mouth 3 (three) times daily as needed for anxiety.) 90 tablet 0   Cyanocobalamin (VITAMIN B-12 PO) Take 5,000 mcg by mouth daily.     diltiazem 2 % GEL Apply 1 Application topically 3 (three) times daily. As directed for 6 weeks. 1 Package 0   rosuvastatin (CRESTOR) 5 MG tablet Take 1 tablet by mouth daily.     topiramate (TOPAMAX) 100 MG tablet TAKE 1 TABLET(100 MG) BY MOUTH DAILY 30 tablet 2   Vitamin D, Ergocalciferol, (DRISDOL) 1.25 MG (50000 UNIT) CAPS capsule TAKE 1 CAPSULE BY MOUTH ONCE WEEKLY 12 capsule 1   tiZANidine (ZANAFLEX) 4 MG tablet TAKE 1 TABLET BY MOUTH TWICE DAILY AS NEEDED 60 tablet 1   triamcinolone cream (KENALOG) 0.1 % Apply 1 application. topically 2 (two) times daily. (Patient not taking: Reported on 11/07/2022) 45 g 3   Current Facility-Administered Medications  Medication Dose Route Frequency Provider Last Rate Last Admin   0.9 %  sodium chloride infusion  500 mL Intravenous Once Sherrilyn Rist, MD          ___________________________________________________________________ Objective   Exam:  BP 97/62   Pulse 85   Temp 97.8 F (36.6 C)   Ht 5\' 4"  (1.626 m)   Wt 121 lb (54.9 kg)   LMP  (LMP Unknown)   SpO2 99%   BMI 20.77  kg/m   CV: regular , S1/S2 Resp: clear to auscultation bilaterally, normal RR and effort noted GI: soft, no tenderness, with active bowel sounds.   Assessment: Encounter Diagnosis  Name Primary?   Rectal pain Yes     Plan: Colonoscopy  The benefits and risks of the planned procedure were described in detail with the patient or (when appropriate) their health care proxy.  Risks were outlined as including, but not limited to, bleeding, infection, perforation, adverse medication reaction leading to cardiac or pulmonary decompensation, pancreatitis (if ERCP).  The limitation of incomplete mucosal visualization was also discussed.  No guarantees or warranties were given.    The patient is  appropriate for an endoscopic procedure in the ambulatory setting.   - Amada JupiterHenry Danis, MD

## 2022-12-23 NOTE — Patient Instructions (Signed)
YOU HAD AN ENDOSCOPIC PROCEDURE TODAY AT THE [facility]:   Refer to the procedure report that was given to you for any specific questions about what was found during the examination.  If the procedure report does not answer your questions, please call your gastroenterologist to clarify.  If you requested that your care partner not be given the details of your procedure findings, then the procedure report has been included in a sealed envelope for you to review at your convenience later.  **Handout given on polyps**  YOU SHOULD EXPECT: Some feelings of bloating in the abdomen. Passage of more gas than usual.  Walking can help get rid of the air that was put into your GI tract during the procedure and reduce the bloating. If you had a lower endoscopy (such as a colonoscopy or flexible sigmoidoscopy) you may notice spotting of blood in your stool or on the toilet paper. If you underwent a bowel prep for your procedure, you may not have a normal bowel movement for a few days.  Please Note:  You might notice some irritation and congestion in your nose or some drainage.  This is from the oxygen used during your procedure.  There is no need for concern and it should clear up in a day or so.  SYMPTOMS TO REPORT IMMEDIATELY:  Following lower endoscopy (colonoscopy or flexible sigmoidoscopy):  Excessive amounts of blood in the stool  Significant tenderness or worsening of abdominal pains  Swelling of the abdomen that is new, acute  Fever of 100F or higher  For urgent or emergent issues, a gastroenterologist can be reached at any hour by calling [phone]. Do not use MyChart messaging for urgent concerns.    DIET:  We do recommend a small meal at first, but then you may proceed to your regular diet.  Drink plenty of fluids but you should avoid alcoholic beverages for 24 hours.  ACTIVITY:  You should plan to take it easy for the rest of today and you should NOT DRIVE or use heavy  machinery until tomorrow (because of the sedation medicines used during the test).    FOLLOW UP: Our staff will call the number listed on your records the next business day following your procedure.  We will call around 7:15- 8:00 am to check on you and address any questions or concerns that you may have regarding the information given to you following your procedure. If we do not reach you, we will leave a message.     If any biopsies were taken you will be contacted by phone or by letter within the next 1-3 weeks.  Please call us at [phone] if you have not heard about the biopsies in 3 weeks.    SIGNATURES/CONFIDENTIALITY: You and/or your care partner have signed paperwork which will be entered into your electronic medical record.  These signatures attest to the fact that that the information above on your After Visit Summary has been reviewed and is understood.  Full responsibility of the confidentiality of this discharge information lies with you and/or your care-partner.  

## 2022-12-23 NOTE — Progress Notes (Signed)
VS by DT  Pt's states no medical or surgical changes since previsit or office visit.  

## 2022-12-23 NOTE — Op Note (Addendum)
[facility] Patient Name: Sheila [name] Procedure Date: 12/23/2022 9:57 AM MRN: [id number] Endoscopist: Sherilyn Cooter L. Myrtie Neither , MD, [id number] Age: 57 Referring MD:  Date of Birth: February 19, 1966 Gender: Female Account #: [id number] Procedure:                Colonoscopy Indications:              Chronic diarrhea, Rectal bleeding, Rectal pain                           See clinic in phone note for details                           Treated empirically for fissure, DRE has been                            challenging due to pain                           MRI pelvis with questionable distal rectal wall                            thickening, no abscess.                           Pain has persisted, alternating diarrhea and                            constipation.                           5 subcentimeter tubular adenomas colonoscopy May                            2023 (Dr. Christella Hartigan)                           5 subcentimeter tubular adenomas colonoscopy 2020                            (Dr. Christella Hartigan) Medicines:                Monitored Anesthesia Care Procedure:                Pre-Anesthesia Assessment:                           - Prior to the procedure, a History and Physical                            was performed, and patient medications and                            allergies were reviewed. The patient's tolerance of                            previous anesthesia was also reviewed. The risks  and benefits of the procedure and the sedation                            options and risks were discussed with the patient.                            All questions were answered, and informed consent                            was obtained. Prior Anticoagulants: The patient has                            taken no anticoagulant or antiplatelet agents. ASA                            Grade Assessment: II - A patient with mild systemic                            disease. After  reviewing the risks and benefits,                            the patient was deemed in satisfactory condition to                            undergo the procedure.                           After obtaining informed consent, the colonoscope                            was passed under direct vision. Throughout the                            procedure, the patient's blood pressure, pulse, and                            oxygen saturations were monitored continuously. The                            Olympus SN [id number] was introduced through the anus                            and advanced to the the terminal ileum, with                            identification of the appendiceal orifice and IC                            valve. The colonoscopy was performed without                            difficulty. The patient tolerated the procedure  well. The quality of the bowel preparation was                            good. The terminal ileum, ileocecal valve,                            appendiceal orifice, and rectum were photographed.                            The bowel preparation used was SUPREP via split                            dose instruction. Scope In: 10:17:34 AM Scope Out: 10:57:06 AM Scope Withdrawal Time: 0 hours 35 minutes 20 seconds  Total Procedure Duration: 0 hours 39 minutes 32 seconds  Findings:                 The perianal and digital rectal examinations were                            normal.                           The terminal ileum appeared normal.                           Normal mucosa was found in the entire colon.                            Biopsies for histology were taken with a cold                            forceps from the right colon and left colon for                            evaluation of microscopic colitis. (Jar 1)                           Four semi-sessile polyps were found in the                            ascending colon. The  polyps were diminutive in                            size. These polyps were removed with a cold snare.                            Resection and retrieval were complete. (Jar 2)                           Five sessile and semi-sessile polyps were found in                            the ascending colon. The polyps were 3 to 8 mm in  size. These polyps were removed with a cold snare.                            Resection and retrieval were complete. (Jar 3)                           Four sessile and semi-sessile polyps were found in                            the ascending colon. The polyps were 3 to 10 mm in                            size. These polyps were removed with a cold snare.                            Resection and retrieval were complete. (Jar 4)                           Three sessile polyps were found in the ascending                            colon. The polyps were diminutive in size. These                            polyps were removed with a cold snare. Resection                            and retrieval were complete. (Jar 5)                           Three sessile and semi-sessile polyps were found in                            the proximal transverse colon. The polyps were 4 to                            8 mm in size. These polyps were removed with a cold                            snare. Resection and retrieval were complete. (Jar                            6)                           Four sessile polyps were found in the mid                            transverse colon and distal transverse colon. The                            polyps were 3 to 6 mm in size. These polyps were  removed with a cold snare. Resection and retrieval                            were complete.                           Internal hemorrhoids were found. The hemorrhoids                            were small.                           The exam was  otherwise without abnormality on                            direct and retroflexion views. (Careful                            retroflexion and forward view examination of                            anorectal area performed)                           It is not clear if all of the diminutive polypoid                            lesion seen were adenomatous, but many appear to be                            under WL and NBI. Interestingly, there would often                            be several polyps clustered in the same area. Complications:            No immediate complications. Estimated Blood Loss:     Estimated blood loss was minimal. Impression:               - The examined portion of the ileum was normal.                           - Normal mucosa in the entire examined colon.                            Biopsied.                           - Four diminutive polyps in the ascending colon,                            removed with a cold snare. Resected and retrieved.                           - Five 3 to 8 mm polyps in the ascending colon,  removed with a cold snare. Resected and retrieved.                           - Four 3 to 10 mm polyps in the ascending colon,                            removed with a cold snare. Resected and retrieved.                           - Three diminutive polyps in the ascending colon,                            removed with a cold snare. Resected and retrieved.                           - Three 4 to 8 mm polyps in the proximal transverse                            colon, removed with a cold snare. Resected and                            retrieved.                           - Four 3 to 6 mm polyps in the mid transverse colon                            and in the distal transverse colon, removed with a                            cold snare. Resected and retrieved.                           - Internal hemorrhoids.                            - The examination was otherwise normal on direct                            and retroflexion views.                           No source for anorectal pain seen on this exam.                            Inpatient with a history of interstitial cystitis                            and pelvic floor dysfunction, suspect this may be                            anal sphincter spasm/heightened sphincter tone. Recommendation:           - Patient has a  contact number available for                            emergencies. The signs and symptoms of potential                            delayed complications were discussed with the                            patient. Return to normal activities tomorrow.                            Written discharge instructions were provided to the                            patient.                           - Resume previous diet.                           - Continue present medications.                           - Await pathology results.                           - Repeat colonoscopy is recommended for                            surveillance. The colonoscopy date will be                            determined after pathology results from today's                            exam become available for review.                           - Discontinue diltiazem ointment                           Begin 0.125% nitroglycerin ointment 3 times daily                            to anal area (prescription will be sent to a local                            compounding pharmacy)                           Follow-up at my office to be arranged. Avaleigh Decuir L. Myrtie Neither, MD 12/23/2022 11:15:44 AM This report has been signed electronically.

## 2022-12-24 ENCOUNTER — Telehealth: Payer: Self-pay

## 2022-12-24 NOTE — Telephone Encounter (Signed)
  Follow up Call-     12/23/2022    9:35 AM 02/05/2022    8:38 AM  Call back number  Post procedure Call Back phone  # [phone]  Permission to leave phone message Yes Yes     Patient questions:  Do you have a fever, pain , or abdominal swelling? Yes.   Pain Score  3 *pain in rectal and perineal area. Mostly when sitting. Pt to pick up prescription for nitroglycerin ointment.    Have you tolerated food without any problems? Yes.    Have you been able to return to your normal activities? Yes.    Do you have any questions about your discharge instructions: Diet   No. Medications  No. Follow up visit  No.  Do you have questions or concerns about your Care? No.  Actions: * If pain score is 4 or above: No action needed, pain <4.   Pt states she has a hard lump on her labia that presented after procedure. Advised patient to contact her OBGYN about this.

## 2022-12-26 ENCOUNTER — Encounter: Payer: Self-pay | Admitting: Gastroenterology

## 2023-01-01 ENCOUNTER — Other Ambulatory Visit: Payer: Self-pay

## 2023-01-01 ENCOUNTER — Encounter: Payer: Self-pay | Admitting: Gastroenterology

## 2023-01-01 DIAGNOSIS — R194 Change in bowel habit: Secondary | ICD-10-CM

## 2023-01-01 DIAGNOSIS — R109 Unspecified abdominal pain: Secondary | ICD-10-CM

## 2023-01-03 ENCOUNTER — Other Ambulatory Visit: Payer: Self-pay | Admitting: Family Medicine

## 2023-01-06 ENCOUNTER — Other Ambulatory Visit (INDEPENDENT_AMBULATORY_CARE_PROVIDER_SITE_OTHER): Payer: Federal, State, Local not specified - PPO

## 2023-01-06 DIAGNOSIS — R109 Unspecified abdominal pain: Secondary | ICD-10-CM | POA: Diagnosis not present

## 2023-01-06 DIAGNOSIS — R194 Change in bowel habit: Secondary | ICD-10-CM | POA: Diagnosis not present

## 2023-01-06 NOTE — Telephone Encounter (Signed)
Left message for pt to return my call. I have additional questions to discuss with patient when she returns my call.

## 2023-01-07 LAB — IGA: Immunoglobulin A: 235 mg/dL (ref 47–310)

## 2023-01-07 LAB — TISSUE TRANSGLUTAMINASE ABS,IGG,IGA
(tTG) Ab, IgA: <1 U/mL
(tTG) Ab, IgG: <1 U/mL

## 2023-01-09 ENCOUNTER — Other Ambulatory Visit: Payer: Self-pay | Admitting: Family Medicine

## 2023-01-09 DIAGNOSIS — F419 Anxiety disorder, unspecified: Secondary | ICD-10-CM

## 2023-01-09 MED ORDER — VENLAFAXINE HCL ER 37.5 MG PO CP24: 37.5000 mg | ORAL_CAPSULE | Freq: Every day | ORAL | 3 refills | Status: DC

## 2023-01-09 NOTE — Telephone Encounter (Signed)
Pt returned my call. Reports that she had stopped Effexor and some other medications due to GI issues (abdominal pain, increased BMS (5-6) daily. Still undergoing GI workup with no known cause for abdominal pain yet. Pt also "facing blindness due to medication she was taking for interstitial cystitis".  States she is not able to manage anxiety without medication at this time and would like to restart Effexor at the lowest dose possible.  Pt would like [place] to let her know when medication has been sent and what dose is sent as well. Please advise.

## 2023-01-10 NOTE — Telephone Encounter (Signed)
Per PCP, 37.5mg  daily at breakfast was sent to pharmacy.  Notified pt and she voices understanding.

## 2023-01-25 ENCOUNTER — Other Ambulatory Visit: Payer: Self-pay | Admitting: Family Medicine

## 2023-01-28 DIAGNOSIS — E785 Hyperlipidemia, unspecified: Secondary | ICD-10-CM | POA: Diagnosis not present

## 2023-01-28 DIAGNOSIS — Z803 Family history of malignant neoplasm of breast: Secondary | ICD-10-CM | POA: Diagnosis not present

## 2023-02-15 ENCOUNTER — Other Ambulatory Visit: Payer: Self-pay | Admitting: Family Medicine

## 2023-02-17 ENCOUNTER — Telehealth: Payer: Self-pay | Admitting: Family Medicine

## 2023-02-17 ENCOUNTER — Other Ambulatory Visit: Payer: Self-pay | Admitting: Family Medicine

## 2023-02-17 DIAGNOSIS — H524 Presbyopia: Secondary | ICD-10-CM | POA: Diagnosis not present

## 2023-02-17 DIAGNOSIS — E559 Vitamin D deficiency, unspecified: Secondary | ICD-10-CM

## 2023-02-17 DIAGNOSIS — H353231 Exudative age-related macular degeneration, bilateral, with active choroidal neovascularization: Secondary | ICD-10-CM | POA: Diagnosis not present

## 2023-02-17 DIAGNOSIS — H5213 Myopia, bilateral: Secondary | ICD-10-CM | POA: Diagnosis not present

## 2023-02-17 DIAGNOSIS — H52223 Regular astigmatism, bilateral: Secondary | ICD-10-CM | POA: Diagnosis not present

## 2023-02-17 MED ORDER — VITAMIN D (ERGOCALCIFEROL) 1.25 MG (50000 UNIT) PO CAPS: 50000.0000 [IU] | ORAL_CAPSULE | ORAL | 1 refills | Status: DC

## 2023-02-17 NOTE — Telephone Encounter (Signed)
Prescription Request  02/17/2023  Is this a "Controlled Substance" medicine? No  LOV: Visit date not found  What is the name of the medication or equipment?  Vitamin D, Ergocalciferol, (DRISDOL) 1.25 MG (50000 UNIT) CAPS capsule   Have you contacted your pharmacy to request a refill? Yes   Which pharmacy would you like this sent to?  [facility] [id number] - [place], [place] - [address] [place] [address] Phone: [phone] Fax: [phone]    Patient notified that their request is being sent to the clinical staff for review and that they should receive a response within 2 business days.   Please advise at Mobile [phone] (mobile)

## 2023-02-17 NOTE — Telephone Encounter (Signed)
Thank you :)

## 2023-03-05 ENCOUNTER — Encounter: Payer: Self-pay | Admitting: Gastroenterology

## 2023-03-05 ENCOUNTER — Ambulatory Visit: Payer: Federal, State, Local not specified - PPO | Admitting: Gastroenterology

## 2023-03-05 VITALS — BP 112/78 | HR 82 | Ht 64.0 in | Wt 120.4 lb

## 2023-03-05 DIAGNOSIS — D126 Benign neoplasm of colon, unspecified: Secondary | ICD-10-CM | POA: Diagnosis not present

## 2023-03-05 DIAGNOSIS — K529 Noninfective gastroenteritis and colitis, unspecified: Secondary | ICD-10-CM

## 2023-03-05 DIAGNOSIS — K6289 Other specified diseases of anus and rectum: Secondary | ICD-10-CM | POA: Diagnosis not present

## 2023-03-05 DIAGNOSIS — H25013 Cortical age-related cataract, bilateral: Secondary | ICD-10-CM | POA: Diagnosis not present

## 2023-03-05 DIAGNOSIS — H2513 Age-related nuclear cataract, bilateral: Secondary | ICD-10-CM | POA: Diagnosis not present

## 2023-03-05 DIAGNOSIS — H353132 Nonexudative age-related macular degeneration, bilateral, intermediate dry stage: Secondary | ICD-10-CM | POA: Diagnosis not present

## 2023-03-05 NOTE — Progress Notes (Signed)
[facility] Progress Note  Chief Complaint: Ano-Rectal pain and adenomatous colon polyps  Subjective  History: Summary of GI issues: Former patient of Dr. Christella Hartigan with a reported history of Crohn's disease.  Later colonoscopies by Dr. Christella Hartigan, including in May 2023 with no evidence of Crohn's.  5 adenomatous polyps removed on that exam, and 5 adenomatous polyps on 2020 colonoscopy Has interstitial cystitis, pelvic floor dysfunction and fibromyalgia followed by urology and other consultants. Evaluated at [facility] February 2024 for severe anorectal/pelvic pain and concern for fissure.  MRI pelvis with some questionable rectal wall thickening, patient treated with antibiotics and seen by Dr. Cliffton Asters of colorectal surgery.  Treated empirically for possible fissure with diltiazem ointment, referred back to [place] for colonoscopy 2 to reports of diarrhea.  Colonoscopy early April 2024 with normal terminal ileum, normal colonic mucosa and biopsies negative for microscopic colitis.  No fissure or other visible anorectal pathology seen.  Incidentally, an additional 23 adenomatous polyps were removed, most of which were subcentimeter and often clustered together some over 10 mm.  No high-grade dysplasia on any of those polyps, 1 year colonoscopy recall recommended Patient also advised to have a TTG antibody drawn and lactulose breath testing to rule out SIBO as a cause of diarrhea.  See phone note-patient did not want to go to our offices lab due to a reported bad experience, order sent to a lab near her home.  She also reported that she was getting more relief of her anorectal spasm pain from diltiazem then from nitroglycerin, so went back to using that.  [name] was glad to catch up today regarding these issues.  She has been using the diltiazem ointment at times if she feels constipation, but more often than not the stool is frequent and sometimes loose.  She is reluctant to take antidiarrheals concerned  that it might precipitate constipation and fissure with worsening of the pain.  She was also especially concerned about the number of polyps that were discovered only 10 months after prior colonoscopy and therefore the recommendation for a 1 year recall rather than sooner.  In addition to her chronic struggles noted above, she has also developed macular degeneration that may be related to prior use of Elmiron for IC.  This has been understandably distressing.  ROS: Cardiovascular:  no chest pain Respiratory: no dyspnea Vision loss as noted above The patient's Past Medical, Family and Social History were reviewed and are on file in the EMR.  Objective:  Med list reviewed  Current Outpatient Medications:    ALPRAZolam (XANAX) 0.5 MG tablet, TAKE 1 TABLET(0.5 MG) BY MOUTH THREE TIMES DAILY AS NEEDED (Patient taking differently: Take 0.5 mg by mouth 3 (three) times daily as needed for anxiety.), Disp: 90 tablet, Rfl: 0   AMBULATORY NON FORMULARY MEDICATION, Medication Name: Nitroglycerin 0.125% gel. Apply pea size amount to the rectum three times a day as needed, Disp: 30 g, Rfl: 0   Cyanocobalamin (VITAMIN B-12 PO), Take 5,000 mcg by mouth daily., Disp: , Rfl:    diltiazem 2 % GEL, Apply 1 Application topically 3 (three) times daily. As directed for 6 weeks., Disp: 1 Package, Rfl: 0   rosuvastatin (CRESTOR) 5 MG tablet, Take 1 tablet by mouth daily., Disp: , Rfl:    tiZANidine (ZANAFLEX) 4 MG tablet, TAKE 1 TABLET BY MOUTH TWICE DAILY AS NEEDED, Disp: 60 tablet, Rfl: 1   topiramate (TOPAMAX) 100 MG tablet, TAKE 1 TABLET(100 MG) BY MOUTH DAILY, Disp: 30  tablet, Rfl: 2   triamcinolone cream (KENALOG) 0.1 %, Apply 1 application. topically 2 (two) times daily., Disp: 45 g, Rfl: 3   venlafaxine XR (EFFEXOR XR) 37.5 MG 24 hr capsule, Take 1 capsule (37.5 mg total) by mouth daily with breakfast., Disp: 30 capsule, Rfl: 3   Vitamin D, Ergocalciferol, (DRISDOL) 1.25 MG (50000 UNIT) CAPS capsule, TAKE 1  CAPSULE BY MOUTH ONCE WEEKLY, Disp: 12 capsule, Rfl: 1   Vitamin D, Ergocalciferol, (DRISDOL) 1.25 MG (50000 UNIT) CAPS capsule, Take 1 capsule (50,000 Units total) by mouth once a week., Disp: 12 capsule, Rfl: 1   Vital signs in last 24 hrs: Vitals:   03/05/23 0918  BP: 112/78  Pulse: 82  SpO2: 98%   Wt Readings from Last 3 Encounters:  03/05/23 120 lb 6.4 oz (54.6 kg)  12/23/22 121 lb (54.9 kg)  11/07/22 121 lb 6.4 oz (55.1 kg)    Physical Exam  Well-appearing-no additional exam.  Entire visit spent in review of records, discussion of issues, review of results and recommendations.  Labs:   ___________________________________________ Radiologic studies: Pathology results from colonoscopy on file and noted above  ____________________________________________ Other:   _____________________________________________ Assessment & Plan  Assessment: Encounter Diagnoses  Name Primary?   Rectal pain Yes   Adenomatous polyp of colon, unspecified part of colon    Chronic diarrhea     Anorectal pain that I believe is sphincter spasm in the context of pelvic floor dysfunction and chronic urologic issues.  She gets some relief from as needed use of diltiazem ointment more so than nitroglycerin.  I mentioned that colorectal surgery sometimes does Botox injections of the anal sphincter in the setting of chronic nonhealing fissure.  I do not know if there is a role for that in this situation, but she would prefer to have no other treatments or prescription medicines at this point given the vision loss that may have occurred as a result of the medicine.  She feels like what is occurring now is just her new normal and she is learning to cope with that.  I reassured her she tested negative for celiac.  I also described the concept of SIBO and my rationale for offering testing.  My clinical suspicion for that is relatively low since she does not have typical risk factors, but breath testing  certainly still reasonable with her symptoms.  She decided not to pursue that at this point. Regarding the colon polyps, I described the variable biology of colon polyps, but that it would be very unusual for person to have this many polyps that developed de-novo since the colonoscopy less than a year prior.  Therefore, I think it is likely that some were present on the 2023 exam but difficult to detect given their location and morphology.  That is why I recommended a 1 year recall, she was reassured after that discussion.  She will otherwise see me as needed in the interim.  30 minutes were spent on this encounter (including chart review, history/exam, counseling/coordination of care, and documentation) > 50% of that time was spent on counseling and coordination of care.   Sheila [name]

## 2023-03-05 NOTE — Patient Instructions (Signed)
_______________________________________________________  If your blood pressure at your visit was 140/90 or greater, please contact your primary care physician to follow up on this.  _______________________________________________________  If you are age 55 or older, your body mass index should be between 23-30. Your Body mass index is 20.67 kg/m. If this is out of the aforementioned range listed, please consider follow up with your Primary Care Provider.  If you are age 75 or younger, your body mass index should be between 19-25. Your Body mass index is 20.67 kg/m. If this is out of the aformentioned range listed, please consider follow up with your Primary Care Provider.   ________________________________________________________  The [facility] providers would like to encourage you to use [facility] to communicate with providers for non-urgent requests or questions.  Due to long hold times on the telephone, sending your provider a message by [facility] may be a faster and more efficient way to get a response.  Please allow 48 business hours for a response.  Please remember that this is for non-urgent requests.  _______________________________________________________  It was a pleasure to see you today!  Thank you for trusting me with your gastrointestinal care!

## 2023-04-29 ENCOUNTER — Ambulatory Visit: Payer: Federal, State, Local not specified - PPO | Admitting: Family Medicine

## 2023-04-29 ENCOUNTER — Other Ambulatory Visit: Payer: Self-pay | Admitting: Family Medicine

## 2023-04-29 ENCOUNTER — Encounter: Payer: Self-pay | Admitting: Family Medicine

## 2023-04-29 ENCOUNTER — Ambulatory Visit (HOSPITAL_BASED_OUTPATIENT_CLINIC_OR_DEPARTMENT_OTHER)
Admission: RE | Admit: 2023-04-29 | Discharge: 2023-04-29 | Disposition: A | Discharge status: Home / Self Care | Payer: Federal, State, Local not specified - PPO | Source: Ambulatory Visit | Attending: Family Medicine | Admitting: Family Medicine

## 2023-04-29 VITALS — BP 100/78 | HR 80 | Temp 98.3°F | Resp 18 | Ht 64.0 in | Wt 118.2 lb

## 2023-04-29 DIAGNOSIS — M5441 Lumbago with sciatica, right side: Secondary | ICD-10-CM

## 2023-04-29 DIAGNOSIS — R339 Retention of urine, unspecified: Secondary | ICD-10-CM | POA: Insufficient documentation

## 2023-04-29 DIAGNOSIS — M549 Dorsalgia, unspecified: Secondary | ICD-10-CM | POA: Diagnosis not present

## 2023-04-29 DIAGNOSIS — M47816 Spondylosis without myelopathy or radiculopathy, lumbar region: Secondary | ICD-10-CM | POA: Diagnosis not present

## 2023-04-29 DIAGNOSIS — R42 Dizziness and giddiness: Secondary | ICD-10-CM

## 2023-04-29 DIAGNOSIS — M419 Scoliosis, unspecified: Secondary | ICD-10-CM | POA: Diagnosis not present

## 2023-04-29 DIAGNOSIS — G43009 Migraine without aura, not intractable, without status migrainosus: Secondary | ICD-10-CM | POA: Diagnosis not present

## 2023-04-29 DIAGNOSIS — M545 Low back pain, unspecified: Secondary | ICD-10-CM | POA: Diagnosis not present

## 2023-04-29 LAB — POC URINALSYSI DIPSTICK (AUTOMATED)
Bilirubin, UA: NEGATIVE
Blood, UA: NEGATIVE
Glucose, UA: NEGATIVE
Ketones, UA: POSITIVE
Nitrite, UA: NEGATIVE
Protein, UA: NEGATIVE
Spec Grav, UA: 1.01 (ref 1.010–1.025)
Urobilinogen, UA: 0.2 E.U./dL
pH, UA: 6.5 (ref 5.0–8.0)

## 2023-04-29 MED ORDER — NITROFURANTOIN MONOHYD MACRO 100 MG PO CAPS: 100.0000 mg | ORAL_CAPSULE | Freq: Two times a day (BID) | ORAL | 0 refills | Status: DC

## 2023-04-29 MED ORDER — TIZANIDINE HCL 4 MG PO TABS
ORAL_TABLET | ORAL | 1 refills | Status: AC
Start: 2023-04-29 — End: ?

## 2023-04-29 NOTE — Progress Notes (Signed)
Established Patient Office Visit  Subjective   Patient ID: Sheila [name], female    DOB: 1966/04/30  Age: 57 y.o. MRN: [id number]  Chief Complaint  Patient presents with   Back Pain    3 weeks, no falls or injuries. No urinary sxs. Pain travels to over the hips.     HPI Discussed the use of AI scribe software for clinical note transcription with the patient, who gave verbal consent to proceed.  History of Present Illness   The patient, with a history of interstitial cystitis, presents with a three-week history of mid to low back pain. The pain is described as deep, radiating to the hip, and is associated with spasms. The patient denies any precipitating events such as trauma or heavy lifting. The pain has been unresponsive to stretching, heat, ice, and over-the-counter analgesics.  In addition to the back pain, the patient reports new onset urinary symptoms. She describes difficulty emptying their bladder fully, necessitating multiple attempts to void. She also reports nausea and dizziness.  The patient has a history of chronic hematuria due to interstitial cystitis, but on this occasion, she reports no visible blood in their urine.      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   Bilateral hip pain 01/28/2022   Low back pain with radiation 01/28/2022   Preventative health care 01/28/2022   COVID-19 02/09/2021   Dyslipidemia 05/18/2020   Severe anxiety with panic 05/18/2020   Post traumatic stress disorder 05/18/2020   Severe episode of recurrent major depressive disorder, without psychotic features (HCC) 05/18/2020   Alpha-1-antitrypsin deficiency (HCC)  05/18/2020   Abnormal vaginal bleeding 01/24/2020   S/P laparoscopic assisted vaginal hysterectomy (LAVH) 01/24/2020   Chronic fatigue 01/05/2020   Epistaxis 11/19/2019   Dry eyes 09/08/2019   Mild persistent asthma without complication 03/09/2019   Hoarseness, persistent 02/27/2018   Reflux laryngitis 02/27/2018   Rectal pain 03/05/2017   H/O Crohn's disease 03/05/2017   Bloating 03/05/2017   Migraine without aura and without status migrainosus, not intractable 08/27/2016   Depression with anxiety 08/04/2016   Fibromyalgia 08/04/2016   Migraines 07/11/2015   Rash and nonspecific skin eruption 04/13/2015   Memory loss of unknown cause 04/13/2015   External hemorrhoid 02/25/2013   Left knee pain 05/04/2012   Anxiety state 11/26/2010   DEPRESSIVE DISORDER 01/11/2009   ABDOMINAL BLOATING 08/19/2008   CROHN'S DISEASE 06/29/2008   LYMPHOPENIA 11/19/2007   HYPERTHYROIDISM 11/05/2007   CERVICAL LYMPHADENOPATHY 11/03/2007   CHRONIC INTERSTITIAL CYSTITIS 08/04/2007   Fibromyalgia muscle pain 08/04/2007   Chest pain 08/04/2007   HEMATURIA, HX OF 08/04/2007   Systemic lupus erythematosus, unspecified SLE type, unspecified organ involvement status (HCC)    Past Medical History:  Diagnosis Date   AAT (alpha-1-antitrypsin) deficiency (HCC)    Anal fissure    Anxiety    Asthma    uses daily   Cancer (HCC)    per gentic test   Chronic interstitial cystitis    Crohn disease (HCC)    Fibromyalgia    Macular degeneration disease    Migraines    Pneumonia 2007   SLE (systemic lupus erythematosus) (HCC)    Past Surgical History:  Procedure Laterality Date  adnoids     BLADDER SURGERY     "couple"   CESAREAN SECTION     COLONOSCOPY     CYSTOSCOPY     KNEE ARTHROSCOPY     "COUPLE"   LAPAROSCOPIC VAGINAL HYSTERECTOMY WITH SALPINGO OOPHORECTOMY Bilateral 01/24/2020   Procedure: LAPAROSCOPIC ASSISTED VAGINAL HYSTERECTOMY WITH BILATERAL OOPHORECTOMY AND LEFT  SALPINGECTOMY;   Surgeon: Richardean Chimera, MD;  Location: [facility] [place];  Service: Gynecology;  Laterality: Bilateral;  need bed   SALPINGECTOMY Right    right   TONSILLECTOMY     WRIST SURGERY Left    x 2   Social History   Tobacco Use   Smoking status: Never   Smokeless tobacco: Never  Vaping Use   Vaping status: Never Used  Substance Use Topics   Alcohol use: No    Alcohol/week: 0.0 standard drinks of alcohol   Drug use: No   Social History   Socioeconomic History   Marital status: Married    Spouse name: Not on file   Number of children: 2   Years of education: Not on file   Highest education level: Not on file  Occupational History   Occupation: Preschool teacher  Tobacco Use   Smoking status: Never   Smokeless tobacco: Never  Vaping Use   Vaping status: Never Used  Substance and Sexual Activity   Alcohol use: No    Alcohol/week: 0.0 standard drinks of alcohol   Drug use: No   Sexual activity: Yes  Other Topics Concern   Not on file  Social History Narrative   Yoga , walking and mindfulness for exercise and stress relief    Social Determinants of Health   Financial Resource Strain: Not on file  Food Insecurity: Not on file  Transportation Needs: Not on file  Physical Activity: Not on file  Stress: Not on file  Social Connections: Not on file  Intimate Partner Violence: Not on file   Family Status  Relation Name Status   Mother  Alive       breast cancer   Father  Deceased       lung cancer   Sister  Alive       ministokes, heart problems   Sister  (Not Specified)   Mat Aunt  (Not Specified)   Mat Aunt  (Not Specified)   Mat Aunt  (Not Specified)   Mat Aunt  (Not Specified)   Mat Aunt  (Not Specified)   MGM  (Not Specified)   MGF  (Not Specified)   Son  Alive       healthy   Son  Alive       healthy   Neg Hx  (Not Specified)  No partnership data on file   Family History  Problem Relation Age of Onset   Breast cancer Mother    Lung cancer  Father        Died in May 20, 2012   Heart disease Father    Irritable bowel syndrome Sister    Breast cancer Maternal Aunt    Breast cancer Maternal Aunt    Ovarian cancer Maternal Aunt    Breast cancer Maternal Aunt    Breast cancer Maternal Aunt    Breast cancer Maternal Aunt    Breast cancer Maternal Grandmother    Ovarian cancer Maternal Grandmother    Prostate cancer Maternal Grandfather    Colon cancer Maternal Grandfather    ADD / ADHD Son    Colon polyps Neg Hx  Esophageal cancer Neg Hx    Kidney disease Neg Hx    Gallbladder disease Neg Hx    Rectal cancer Neg Hx    Stomach cancer Neg Hx    Crohn's disease Neg Hx    Allergies  Allergen Reactions   Ampicillin    Cymbalta [Duloxetine Hcl] Nausea Only    Constipation, bloating   Doxycycline Diarrhea   Fentanyl Citrate Nausea And Vomiting    fever   Penicillins       Review of Systems  Constitutional:  Negative for fever and malaise/fatigue.  HENT:  Negative for congestion.   Eyes:  Negative for blurred vision.  Respiratory:  Negative for cough and shortness of breath.   Cardiovascular:  Negative for chest pain, palpitations and leg swelling.  Gastrointestinal:  Negative for abdominal pain, blood in stool, nausea and vomiting.  Genitourinary:  Negative for dysuria and frequency.  Musculoskeletal:  Positive for back pain. Negative for falls.  Skin:  Negative for rash.  Neurological:  Negative for dizziness, loss of consciousness and headaches.  Endo/Heme/Allergies:  Negative for environmental allergies.  Psychiatric/Behavioral:  Negative for depression. The patient is not nervous/anxious.       Objective:     BP 100/78 (BP Location: Left Arm, Patient Position: Sitting, Cuff Size: Normal)   Pulse 80   Temp 98.3 F (36.8 C) (Oral)   Resp 18   Ht 5\' 4"  (1.626 m)   Wt 118 lb 3.2 oz (53.6 kg)   LMP  (LMP Unknown)   SpO2 99%   BMI 20.29 kg/m  BP Readings from Last 3 Encounters:  04/29/23 100/78  03/05/23  112/78  12/23/22 119/71   Wt Readings from Last 3 Encounters:  04/29/23 118 lb 3.2 oz (53.6 kg)  03/05/23 120 lb 6.4 oz (54.6 kg)  12/23/22 121 lb (54.9 kg)   SpO2 Readings from Last 3 Encounters:  04/29/23 99%  03/05/23 98%  12/23/22 98%      Physical Exam Vitals and nursing note reviewed.  Constitutional:      General: She is not in acute distress.    Appearance: Normal appearance. She is well-developed.  HENT:     Head: Normocephalic and atraumatic.  Eyes:     General: No scleral icterus.       Right eye: No discharge.        Left eye: No discharge.  Cardiovascular:     Rate and Rhythm: Normal rate and regular rhythm.     Heart sounds: No murmur heard. Pulmonary:     Effort: Pulmonary effort is normal. No respiratory distress.     Breath sounds: Normal breath sounds.  Abdominal:     Palpations: Abdomen is soft.     Tenderness: There is no abdominal tenderness.  Musculoskeletal:        General: Tenderness present. Normal range of motion.     Cervical back: Normal range of motion and neck supple.     Right lower leg: No edema.     Left lower leg: No edema.  Skin:    General: Skin is warm and dry.  Neurological:     Mental Status: She is alert and oriented to person, place, and time.     Motor: No weakness.     Gait: Gait abnormal.     Deep Tendon Reflexes: Reflexes normal.  Psychiatric:        Mood and Affect: Mood normal.        Behavior: Behavior normal.  Thought Content: Thought content normal.        Judgment: Judgment normal.      Results for orders placed or performed in visit on 04/29/23  POCT Urinalysis Dipstick (Automated)  Result Value Ref Range   Color, UA yellow    Clarity, UA clear    Glucose, UA Negative Negative   Bilirubin, UA negative    Ketones, UA Positive    Spec Grav, UA 1.010 1.010 - 1.025   Blood, UA negative    pH, UA 6.5 5.0 - 8.0   Protein, UA Negative Negative   Urobilinogen, UA 0.2 0.2 or 1.0 E.U./dL   Nitrite, UA  negative    Leukocytes, UA Trace (A) Negative    Last CBC Lab Results  Component Value Date   WBC 7.9 11/07/2022   HGB 14.1 11/07/2022   HCT 42.3 11/07/2022   MCV 91.8 11/07/2022   MCH 29.9 02/12/2022   RDW 13.7 11/07/2022   PLT 299.0 11/07/2022   Last metabolic panel Lab Results  Component Value Date   GLUCOSE 87 11/07/2022   NA 139 11/07/2022   K 3.6 11/07/2022   CL 104 11/07/2022   CO2 26 11/07/2022   BUN 13 11/07/2022   CREATININE 0.82 11/07/2022   GFR 80.98 11/07/2022   CALCIUM 9.7 11/07/2022   PROT 8.0 11/07/2022   ALBUMIN 5.0 11/07/2022   BILITOT 0.6 11/07/2022   ALKPHOS 94 11/07/2022   AST 17 11/07/2022   ALT 10 11/07/2022   ANIONGAP 10 01/20/2020   Last lipids Lab Results  Component Value Date   CHOL 256 (H) 01/28/2022   HDL 75.20 01/28/2022   LDLCALC 167 (H) 01/28/2022   TRIG 69.0 01/28/2022   CHOLHDL 3 01/28/2022   Last hemoglobin A1c No results found for: "HGBA1C" Last thyroid functions Lab Results  Component Value Date   TSH 1.28 01/28/2022   Last vitamin D Lab Results  Component Value Date   VD25OH 25.06 (L) 01/28/2022   Last vitamin B12 and Folate Lab Results  Component Value Date   VITAMINB12 >1504 (H) 01/28/2022   FOLATE 10.8 03/05/2010      The 10-year ASCVD risk score (Arnett DK, et al., 2019) is: 1.2%    Assessment & Plan:   Problem List Items Addressed This Visit       Unprioritized   Migraine without aura and without status migrainosus, not intractable   Relevant Medications   tiZANidine (ZANAFLEX) 4 MG tablet   Urinary retention   Relevant Medications   nitrofurantoin, macrocrystal-monohydrate, (MACROBID) 100 MG capsule   Other Relevant Orders   POCT Urinalysis Dipstick (Automated) (Completed)   Urine Culture   CBC with Differential/Platelet   Comprehensive metabolic panel   TSH   Vitamin B12   VITAMIN D 25 Hydroxy (Vit-D Deficiency, Fractures)   DG Lumbar Spine Complete   DG Thoracic Spine 2 View   Dizzy    Relevant Orders   CBC with Differential/Platelet   Comprehensive metabolic panel   TSH   Vitamin B12   VITAMIN D 25 Hydroxy (Vit-D Deficiency, Fractures)   Acute right-sided low back pain with right-sided sciatica - Primary    Try tizanidine  Check xrays and labs  Ua-- essencially normal       Relevant Medications   tiZANidine (ZANAFLEX) 4 MG tablet  Assessment and Plan    Lower Back Pain Persistent for three weeks, radiating to the hip, worsened with movement, and not associated with any known injury or strain. Possible differential diagnoses  include musculoskeletal strain, kidney infection, or disc herniation. -Order X-ray of the mid and lower back to assess for any structural abnormalities. -Order blood work to assess kidney function. -Continue with conservative management (heat, ice, Advil). -Try Tizanidine as a muscle relaxant for pain relief.  Possible Urinary Tract Infection New onset of incomplete bladder emptying and back pain, in the context of a history of interstitial cystitis. Urinalysis shows leukocytes and ketones, but no blood or nitrates. -Start Macrobid twice a day for seven days, pending urine culture results. If culture is negative, stop the antibiotic. -Continue monitoring symptoms and report any changes.  Interstitial Cystitis Chronic condition, currently stable. -Continue current management plan.  Migraine Chronic condition, currently well-controlled with Topiramate. -Renew prescription for Tizanidine for use as an emergency migraine medication.        No follow-ups on file.    Donato Schultz, DO

## 2023-04-29 NOTE — Assessment & Plan Note (Signed)
Try tizanidine  Check xrays and labs  Ua-- essencially normal

## 2023-05-02 ENCOUNTER — Other Ambulatory Visit: Payer: Self-pay | Admitting: Family Medicine

## 2023-05-02 DIAGNOSIS — F419 Anxiety disorder, unspecified: Secondary | ICD-10-CM

## 2023-05-13 DIAGNOSIS — H2513 Age-related nuclear cataract, bilateral: Secondary | ICD-10-CM | POA: Diagnosis not present

## 2023-05-13 DIAGNOSIS — H35383 Toxic maculopathy, bilateral: Secondary | ICD-10-CM | POA: Diagnosis not present

## 2023-05-13 DIAGNOSIS — H43823 Vitreomacular adhesion, bilateral: Secondary | ICD-10-CM | POA: Diagnosis not present

## 2023-06-05 DIAGNOSIS — Z13820 Encounter for screening for osteoporosis: Secondary | ICD-10-CM | POA: Diagnosis not present

## 2023-06-23 ENCOUNTER — Telehealth: Payer: Self-pay | Admitting: Family Medicine

## 2023-06-23 NOTE — Telephone Encounter (Signed)
Pt's husband states Dr.Beane's office from [facility] is requesting imaging results faxed to them at [phone].

## 2023-06-23 NOTE — Telephone Encounter (Signed)
Imaging routed via Epic

## 2023-06-26 DIAGNOSIS — M5451 Vertebrogenic low back pain: Secondary | ICD-10-CM | POA: Diagnosis not present

## 2023-07-09 DIAGNOSIS — M533 Sacrococcygeal disorders, not elsewhere classified: Secondary | ICD-10-CM | POA: Diagnosis not present

## 2023-07-30 ENCOUNTER — Encounter: Payer: Self-pay | Admitting: Psychiatry

## 2023-08-11 DIAGNOSIS — M5459 Other low back pain: Secondary | ICD-10-CM | POA: Diagnosis not present

## 2023-08-11 DIAGNOSIS — M25552 Pain in left hip: Secondary | ICD-10-CM | POA: Diagnosis not present

## 2023-08-19 DIAGNOSIS — H35383 Toxic maculopathy, bilateral: Secondary | ICD-10-CM | POA: Diagnosis not present

## 2023-08-19 DIAGNOSIS — H353131 Nonexudative age-related macular degeneration, bilateral, early dry stage: Secondary | ICD-10-CM | POA: Diagnosis not present

## 2023-08-19 DIAGNOSIS — H43823 Vitreomacular adhesion, bilateral: Secondary | ICD-10-CM | POA: Diagnosis not present

## 2023-08-19 DIAGNOSIS — M5451 Vertebrogenic low back pain: Secondary | ICD-10-CM | POA: Diagnosis not present

## 2023-08-19 DIAGNOSIS — M7062 Trochanteric bursitis, left hip: Secondary | ICD-10-CM | POA: Diagnosis not present

## 2023-08-19 DIAGNOSIS — H2513 Age-related nuclear cataract, bilateral: Secondary | ICD-10-CM | POA: Diagnosis not present

## 2023-08-21 ENCOUNTER — Telehealth: Payer: Self-pay | Admitting: Gastroenterology

## 2023-08-21 NOTE — Telephone Encounter (Signed)
PT is calling to have a new script for the compound medication dilt hcl 2% lido hcl 5% ointment sent to the [facility]. Requesting a call back

## 2023-08-25 ENCOUNTER — Other Ambulatory Visit: Payer: Self-pay

## 2023-08-25 MED ORDER — DILTIAZEM GEL 2 %: 1.0000 | Freq: Three times a day (TID) | CUTANEOUS | 0 refills | Status: DC

## 2023-09-11 DIAGNOSIS — M25552 Pain in left hip: Secondary | ICD-10-CM | POA: Diagnosis not present

## 2023-09-15 DIAGNOSIS — M25552 Pain in left hip: Secondary | ICD-10-CM | POA: Diagnosis not present

## 2023-09-23 ENCOUNTER — Other Ambulatory Visit: Payer: Self-pay | Admitting: Obstetrics and Gynecology

## 2023-09-23 DIAGNOSIS — Z803 Family history of malignant neoplasm of breast: Secondary | ICD-10-CM

## 2023-09-23 LAB — LAB REPORT - SCANNED
A1c: 5.6
EGFR: 86
TSH: 0.71 (ref 0.41–5.90)

## 2023-09-26 ENCOUNTER — Other Ambulatory Visit: Payer: Self-pay | Admitting: Family Medicine

## 2023-09-26 DIAGNOSIS — F419 Anxiety disorder, unspecified: Secondary | ICD-10-CM

## 2023-10-08 ENCOUNTER — Other Ambulatory Visit: Payer: Self-pay

## 2023-10-08 ENCOUNTER — Telehealth: Payer: Self-pay | Admitting: Gastroenterology

## 2023-10-08 DIAGNOSIS — D126 Benign neoplasm of colon, unspecified: Secondary | ICD-10-CM

## 2023-10-08 DIAGNOSIS — D123 Benign neoplasm of transverse colon: Secondary | ICD-10-CM

## 2023-10-08 DIAGNOSIS — D122 Benign neoplasm of ascending colon: Secondary | ICD-10-CM

## 2023-10-08 MED ORDER — SUFLAVE 178.7 G PO SOLR
1.0000 | Freq: Once | ORAL | 0 refills | Status: AC
Start: 2023-10-08 — End: 2023-10-08

## 2023-10-08 NOTE — Telephone Encounter (Addendum)
 error

## 2023-10-15 ENCOUNTER — Telehealth: Payer: Self-pay | Admitting: Family Medicine

## 2023-10-15 NOTE — Telephone Encounter (Signed)
Pt came in office stating is needing refill for her migraines topiramate (TOPAMAX) 100 MG tablet  90 day supply, sent to  [facility] [id number] - [place], [place] - [address], [place] [address] Phone: [phone]  Fax: [phone]  Pt was informed that if she has not seen provider recently that will need an appt to get meds, pt states provider already seen her and just needed to get refill.

## 2023-10-15 NOTE — Telephone Encounter (Signed)
Rx hasn't been refilled since 2022. Please advise

## 2023-10-17 ENCOUNTER — Telehealth: Payer: Self-pay

## 2023-10-17 ENCOUNTER — Other Ambulatory Visit: Payer: Self-pay | Admitting: Family Medicine

## 2023-10-17 DIAGNOSIS — G43009 Migraine without aura, not intractable, without status migrainosus: Secondary | ICD-10-CM

## 2023-10-17 MED ORDER — TOPIRAMATE 100 MG PO TABS
100.0000 mg | ORAL_TABLET | Freq: Every day | ORAL | 2 refills | Status: DC
Start: 2023-10-17 — End: 2024-01-12

## 2023-10-17 NOTE — Telephone Encounter (Signed)
Copied from CRM [id number]. Topic: Clinical - Prescription Issue >> Oct 17, 2023  1:58 PM Alona Bene A wrote: Reason for CRM: Patient called in to check on prescription refill status. Patient stated that Dr. Marcelyn Bruins was refilling medication but they have now suggested that she see her pcp to have it refilled. Please contact patient on next steps as this is not a new symptom. Patient can be reached at [phone].

## 2023-10-22 ENCOUNTER — Other Ambulatory Visit: Payer: Self-pay | Admitting: Specialist

## 2023-10-22 DIAGNOSIS — M259 Joint disorder, unspecified: Secondary | ICD-10-CM

## 2023-10-23 ENCOUNTER — Telehealth: Payer: Self-pay | Admitting: *Deleted

## 2023-10-23 ENCOUNTER — Other Ambulatory Visit: Payer: Self-pay | Admitting: Family Medicine

## 2023-10-23 DIAGNOSIS — M1990 Unspecified osteoarthritis, unspecified site: Secondary | ICD-10-CM

## 2023-10-23 NOTE — Telephone Encounter (Signed)
 Copied from CRM [id number]. Topic: Clinical - Request for Lab/Test Order >> Oct 23, 2023 11:30 AM Sheila [name] wrote: Reason for CRM: Patient called in regarding orthopedic surgeon requesting rheumatoid panel to be completed by primary care if possible. Please reach out to patient via MyChart or phone for scheduling or update

## 2023-10-24 ENCOUNTER — Other Ambulatory Visit (INDEPENDENT_AMBULATORY_CARE_PROVIDER_SITE_OTHER): Payer: Federal, State, Local not specified - PPO

## 2023-10-24 DIAGNOSIS — M1990 Unspecified osteoarthritis, unspecified site: Secondary | ICD-10-CM | POA: Diagnosis not present

## 2023-10-25 LAB — RHEUMATOID FACTOR: Rheumatoid fact SerPl-aCnc: <10 [IU]/mL (ref <14)

## 2023-10-27 ENCOUNTER — Encounter: Payer: Self-pay | Admitting: Family Medicine

## 2023-10-27 ENCOUNTER — Other Ambulatory Visit: Payer: Self-pay | Admitting: Family Medicine

## 2023-10-27 DIAGNOSIS — F419 Anxiety disorder, unspecified: Secondary | ICD-10-CM

## 2023-11-06 ENCOUNTER — Other Ambulatory Visit: Payer: Federal, State, Local not specified - PPO

## 2023-11-10 ENCOUNTER — Encounter: Payer: Self-pay | Admitting: Specialist

## 2023-11-14 ENCOUNTER — Ambulatory Visit
Admission: RE | Admit: 2023-11-14 | Discharge: 2023-11-14 | Disposition: A | Discharge status: Home / Self Care | Payer: Federal, State, Local not specified - PPO | Source: Ambulatory Visit | Attending: Specialist | Admitting: Specialist

## 2023-11-14 DIAGNOSIS — M259 Joint disorder, unspecified: Secondary | ICD-10-CM

## 2023-11-14 NOTE — Progress Notes (Signed)
 Pt states chronic left pain that travels anterior  Sports injury No hx of sx

## 2023-11-18 ENCOUNTER — Telehealth: Admitting: Family Medicine

## 2023-11-18 VITALS — BP 120/70 | HR 77 | Ht 64.0 in | Wt 120.0 lb

## 2023-11-18 DIAGNOSIS — T753XXA Motion sickness, initial encounter: Secondary | ICD-10-CM

## 2023-11-18 DIAGNOSIS — N3010 Interstitial cystitis (chronic) without hematuria: Secondary | ICD-10-CM | POA: Diagnosis not present

## 2023-11-18 DIAGNOSIS — F419 Anxiety disorder, unspecified: Secondary | ICD-10-CM | POA: Diagnosis not present

## 2023-11-18 MED ORDER — VENLAFAXINE HCL ER 37.5 MG PO CP24: 37.5000 mg | ORAL_CAPSULE | Freq: Every day | ORAL | 3 refills | Status: AC

## 2023-11-18 MED ORDER — ALPRAZOLAM 1 MG PO TABS: ORAL_TABLET | ORAL | 0 refills | Status: DC

## 2023-11-18 MED ORDER — SCOPOLAMINE 1 MG/3DAYS TD PT72: 1.0000 | MEDICATED_PATCH | TRANSDERMAL | 12 refills | Status: DC

## 2023-11-18 NOTE — Progress Notes (Unsigned)
 MyChart Video Visit    Virtual Visit via Video Note   This patient is at least at moderate risk for complications without adequate follow up. This format is felt to be most appropriate for this patient at this time. Physical exam was limited by quality of the video and audio technology used for the visit. heather was able to get the patient set up on a video visit.  Patient location: home  Patient and provider in visit Provider location: Office  I discussed the limitations of evaluation and management by telemedicine and the availability of in person appointments. The patient expressed understanding and agreed to proceed.  Visit Date: 11/18/2023  Today's healthcare provider: Donato Schultz, DO     Subjective:    Patient ID: Sheila [name], female    DOB: [birth year], 58 y.o.   MRN: [id number]  No chief complaint on file.   HPI Discussed the use of AI scribe software for clinical note transcription with the patient, who gave verbal consent to proceed.  History of Present Illness   Sheila [name] "[name]" is a 59 year old female who presents for a wellness check and medication refills.  She is preparing for a cruise in early April and requires refills for venlafaxine, alprazolam, and scopolamine patches for motion sickness. She typically takes alprazolam 1 mg three times a day but often only takes one at night. Her previous prescription was for 90 tablets, and she has about a week left. She also needs a refill of Vanaflex 37.5 mg and has one refill left for topiramate. She is currently taking rivastatin 5 mg, prescribed by another physician.  She has been experiencing significant vision issues, which she attributes to long-term use of Elmiron for her interstitial cystitis. Her vision is deteriorating, and two retina specialists have suggested that her vision loss is due to nearly 20 years of Elmiron use, both orally and directly into her bladder.  She has ongoing issues with  her sacroiliac joint and has undergone several diagnostic studies, including MRIs and a recent CT scan, to determine the cause of her back and hip pain. She has consulted multiple specialists regarding this issue and is considering options other than surgery due to potential flares of her lupus and interstitial cystitis.  She completed her annual labs in January with another provider, but the results have not been sent over yet. She is due for a recheck in April and is scheduled for a colonoscopy in April to follow up on the removal of 23 polyps from a previous procedure.       Past Medical History:  Diagnosis Date   AAT (alpha-1-antitrypsin) deficiency (HCC)    Anal fissure    Anxiety    Asthma    uses daily   Cancer (HCC)    per gentic test   Chronic interstitial cystitis    Crohn disease (HCC)    Fibromyalgia    Macular degeneration disease    Migraines    Pneumonia 2007   SLE (systemic lupus erythematosus) (HCC)     Past Surgical History:  Procedure Laterality Date   adnoids     BLADDER SURGERY     "couple"   CESAREAN SECTION     COLONOSCOPY     CYSTOSCOPY     KNEE ARTHROSCOPY     "COUPLE"   LAPAROSCOPIC VAGINAL HYSTERECTOMY WITH SALPINGO OOPHORECTOMY Bilateral 01/24/2020   Procedure: LAPAROSCOPIC ASSISTED VAGINAL HYSTERECTOMY WITH BILATERAL OOPHORECTOMY AND LEFT  SALPINGECTOMY;  Surgeon:  Richardean Chimera, MD;  Location: [facility];  Service: Gynecology;  Laterality: Bilateral;  need bed   SALPINGECTOMY Right    right   TONSILLECTOMY     WRIST SURGERY Left    x 2    Family History  Problem Relation Age of Onset   Breast cancer Mother    Lung cancer Father        Died in 12/22/11   Heart disease Father    Irritable bowel syndrome Sister    Breast cancer Maternal Aunt    Breast cancer Maternal Aunt    Ovarian cancer Maternal Aunt    Breast cancer Maternal Aunt    Breast cancer Maternal Aunt    Breast cancer Maternal Aunt    Breast cancer Maternal  Grandmother    Ovarian cancer Maternal Grandmother    Prostate cancer Maternal Grandfather    Colon cancer Maternal Grandfather    ADD / ADHD Son    Colon polyps Neg Hx    Esophageal cancer Neg Hx    Kidney disease Neg Hx    Gallbladder disease Neg Hx    Rectal cancer Neg Hx    Stomach cancer Neg Hx    Crohn's disease Neg Hx     Social History   Socioeconomic History   Marital status: Married    Spouse name: Not on file   Number of children: 2   Years of education: Not on file   Highest education level: Bachelor's degree (e.g., BA, AB, BS)  Occupational History   Occupation: Manufacturing systems engineer  Tobacco Use   Smoking status: Never   Smokeless tobacco: Never  Vaping Use   Vaping status: Never Used  Substance and Sexual Activity   Alcohol use: No    Alcohol/week: 0.0 standard drinks of alcohol   Drug use: No   Sexual activity: Yes  Other Topics Concern   Not on file  Social History Narrative   Yoga , walking and mindfulness for exercise and stress relief    Social Drivers of Corporate investment banker Strain: Not on file  Food Insecurity: Not on file  Transportation Needs: No Transportation Needs (11/18/2023)   PRAPARE - Administrator, Civil Service (Medical): No    Lack of Transportation (Non-Medical): No  Physical Activity: Not on file  Stress: Not on file  Social Connections: Unknown (11/18/2023)   Social Connection and Isolation Panel [NHANES]    Frequency of Communication with Friends and Family: More than three times a week    Frequency of Social Gatherings with Friends and Family: Twice a week    Attends Religious Services: Not on Marketing executive or Organizations: No    Attends Engineer, structural: Not on file    Marital Status: Married  Catering manager Violence: Not on file    Outpatient Medications Prior to Visit  Medication Sig Dispense Refill   AMBULATORY NON FORMULARY MEDICATION Medication Name: Nitroglycerin  0.125% gel. Apply pea size amount to the rectum three times a day as needed 30 g 0   diltiazem 2 % GEL Apply 1 Application topically 3 (three) times daily. As directed for 6 weeks. 1 Package 0   nitrofurantoin, macrocrystal-monohydrate, (MACROBID) 100 MG capsule Take 1 capsule (100 mg total) by mouth 2 (two) times daily. 14 capsule 0   rosuvastatin (CRESTOR) 5 MG tablet Take 1 tablet by mouth daily.     tiZANidine (ZANAFLEX) 4 MG tablet TAKE 1  TABLET BY MOUTH TWICE DAILY AS NEEDED 60 tablet 1   topiramate (TOPAMAX) 100 MG tablet Take 1 tablet (100 mg total) by mouth daily. 30 tablet 2   triamcinolone cream (KENALOG) 0.1 % Apply 1 application. topically 2 (two) times daily. 45 g 3   Vitamin D, Ergocalciferol, (DRISDOL) 1.25 MG (50000 UNIT) CAPS capsule TAKE 1 CAPSULE BY MOUTH ONCE WEEKLY 12 capsule 1   Vitamin D, Ergocalciferol, (DRISDOL) 1.25 MG (50000 UNIT) CAPS capsule Take 1 capsule (50,000 Units total) by mouth once a week. 12 capsule 1   ALPRAZolam (XANAX) 0.5 MG tablet TAKE 1 TABLET(0.5 MG) BY MOUTH THREE TIMES DAILY AS NEEDED (Patient taking differently: Take 0.5 mg by mouth 3 (three) times daily as needed for anxiety.) 90 tablet 0   Cyanocobalamin (VITAMIN B-12 PO) Take 5,000 mcg by mouth daily.     venlafaxine XR (EFFEXOR-XR) 37.5 MG 24 hr capsule TAKE 1 CAPSULE BY MOUTH DAILY WITH BREAKFAST 90 capsule 0   No facility-administered medications prior to visit.    Allergies  Allergen Reactions   Ampicillin    Cymbalta [Duloxetine Hcl] Nausea Only    Constipation, bloating   Doxycycline Diarrhea   Fentanyl Citrate Nausea And Vomiting    fever   Penicillins     Review of Systems  Constitutional:  Negative for fever and malaise/fatigue.  HENT:  Negative for congestion.   Eyes:  Negative for blurred vision.  Respiratory:  Negative for cough and shortness of breath.   Cardiovascular:  Negative for chest pain, palpitations and leg swelling.  Gastrointestinal:  Negative for abdominal  pain, blood in stool, nausea and vomiting.  Genitourinary:  Negative for dysuria and frequency.  Musculoskeletal:  Negative for back pain and falls.  Skin:  Negative for rash.  Neurological:  Negative for dizziness, loss of consciousness and headaches.  Endo/Heme/Allergies:  Negative for environmental allergies.  Psychiatric/Behavioral:  Negative for depression. The patient is not nervous/anxious.        Objective:    Physical Exam Vitals and nursing note reviewed.  Constitutional:      General: She is not in acute distress.    Appearance: Normal appearance. She is well-developed.  HENT:     Head: Normocephalic and atraumatic.  Eyes:     General: No scleral icterus.       Right eye: No discharge.        Left eye: No discharge.  Cardiovascular:     Rate and Rhythm: Normal rate and regular rhythm.     Heart sounds: No murmur heard. Pulmonary:     Effort: Pulmonary effort is normal. No respiratory distress.     Breath sounds: Normal breath sounds.  Musculoskeletal:        General: Normal range of motion.     Cervical back: Normal range of motion and neck supple.     Right lower leg: No edema.     Left lower leg: No edema.  Skin:    General: Skin is warm and dry.  Neurological:     Mental Status: She is alert and oriented to person, place, and time.  Psychiatric:        Mood and Affect: Mood normal.        Behavior: Behavior normal.        Thought Content: Thought content normal.        Judgment: Judgment normal.     BP 120/70   Pulse 77   Ht 5\' 4"  (1.626 m)   Wt  120 lb (54.4 kg)   LMP  (LMP Unknown)   BMI 20.60 kg/m  Wt Readings from Last 3 Encounters:  11/18/23 120 lb (54.4 kg)  04/29/23 118 lb 3.2 oz (53.6 kg)  03/05/23 120 lb 6.4 oz (54.6 kg)       Assessment & Plan:  Interstitial cystitis -     ALPRAZolam; 1/2-1 tid as needed  Dispense: 90 tablet; Refill: 0  Anxiety -     Venlafaxine HCl ER; Take 1 capsule (37.5 mg total) by mouth daily with  breakfast.  Dispense: 90 capsule; Refill: 3  Motion sickness, initial encounter -     Scopolamine; Place 1 patch (1.5 mg total) onto the skin every 3 (three) days.  Dispense: 10 patch; Refill: 12   Assessment and Plan    Medication Refill and Wellness Check   She requires refills for multiple medications and is preparing for an upcoming cruise. Discussed the need for scopolamine patches for severe motion sickness and the importance of washing hands after application to avoid eye dilation. Refill venlafaxine 37.5 mg, alprazolam 0.5 mg (90 tablets, half to one tablet three times a day), and scopolamine patches for motion sickness.  SI Joint Pain   She experiences ongoing SI joint pain, evaluated by multiple specialists. Recent CT scan results are pending review by Dr. Shon Baton. Discussed potential interventions including a nerve study and her preference to avoid surgery due to the risk of exacerbating lupus and IC. Follow up with Dr. Shon Baton for CT scan results and consider nerve study or other interventions based on specialist recommendations.  Interstitial Cystitis (IC)   She uses alprazolam primarily at night for symptom management. Experienced vision issues attributed to long-term use of Elmiron. Discussed the impact of Elmiron on vision and the need for ongoing monitoring. Continue current management of IC and monitor vision changes, following up with an eye specialist as needed.  Vision Loss   Significant vision loss is attributed to long-term use of Elmiron for IC. Two retina specialists confirmed the association. Discussed the need for ongoing follow-up with retina specialists and monitoring of vision changes. Continue follow-up with retina specialists and monitor vision changes closely.  Autoimmune Disorders (Lupus)   Lupus can be exacerbated by anesthesia and other stressors. Discussed the potential risks of surgery and the importance of considering second opinions for any surgical  recommendations. Avoid unnecessary surgeries and consider second opinions for any surgical recommendations.  General Health Maintenance   She had annual labs done in January with another provider and is due for a recheck in April. Discussed the need for follow-up labs and monitoring general health parameters including blood pressure and weight. Schedule a follow-up appointment for labs in May or June and monitor general health parameters including blood pressure and weight.  Follow-up   Follow up with Dr. Shon Baton at [facility] for SI joint pain. Undergo colonoscopy in April for follow-up on previous polyps. Schedule a follow-up appointment for labs in May or June.        I discussed the assessment and treatment plan with the patient. The patient was provided an opportunity to ask questions and all were answered. The patient agreed with the plan and demonstrated an understanding of the instructions.   The patient was advised to call back or seek an in-person evaluation if the symptoms worsen or if the condition fails to improve as anticipated.  Donato Schultz, DO  [facility] [phone] (phone) [phone] (fax)  [facility]

## 2023-11-20 ENCOUNTER — Encounter: Payer: Self-pay | Admitting: Family Medicine

## 2023-12-08 ENCOUNTER — Other Ambulatory Visit: Payer: Federal, State, Local not specified - PPO

## 2023-12-09 ENCOUNTER — Telehealth: Payer: Self-pay | Admitting: Family Medicine

## 2023-12-09 NOTE — Telephone Encounter (Signed)
 Pt dropped off surgical clearance forms to be completed by pcp. Pt request forms to be faxed to the number on the form. Pt request to be called when form has been fax.place forms in providers box in front office.

## 2023-12-09 NOTE — Telephone Encounter (Signed)
 Pt dropped off surgical clearance forms to be completed by

## 2023-12-10 NOTE — Telephone Encounter (Signed)
 Pt need pre-op appt please

## 2023-12-16 ENCOUNTER — Ambulatory Visit: Admitting: Family Medicine

## 2023-12-16 ENCOUNTER — Encounter: Payer: Self-pay | Admitting: Family Medicine

## 2023-12-16 VITALS — BP 110/70 | HR 76 | Temp 98.1°F | Resp 16 | Ht 64.0 in | Wt 126.8 lb

## 2023-12-16 DIAGNOSIS — Z01818 Encounter for other preprocedural examination: Secondary | ICD-10-CM

## 2023-12-16 DIAGNOSIS — M25552 Pain in left hip: Secondary | ICD-10-CM | POA: Diagnosis not present

## 2023-12-16 NOTE — Progress Notes (Signed)
 Subjective:    Sheila [name] is a 58 y.o. female who presents to the office today for a preoperative consultation at the request of surgeon DR BROOKS who plans on performing SI FUSION on  tbd . This consultation is requested for the specific conditions prompting preoperative evaluation (i.e. because of potential affect on operative risk): SLE, FIBROMYALGIA . Planned anesthesia: general. The patient has the following known anesthesia issues:  NO FENTANYL  . Patients bleeding risk: no recent abnormal bleeding. Patient does not have objections to receiving blood products if needed.  The following portions of the patient's history were reviewed and updated as appropriate: She  has a past medical history of AAT (alpha-1-antitrypsin) deficiency (HCC), Anal fissure, Anxiety, Asthma, Cancer (HCC), Chronic interstitial cystitis, Crohn disease (HCC), Fibromyalgia, Macular degeneration disease, Migraines, Pneumonia (2007), and SLE (systemic lupus erythematosus) (HCC). She does not have any pertinent problems on file. She  has a past surgical history that includes Cesarean section; Knee arthroscopy; Wrist surgery (Left); Bladder surgery; Tonsillectomy; Salpingectomy (Right); Cystoscopy; Laparoscopic vaginal hysterectomy with salpingo oophorectomy (Bilateral, 01/24/2020); adnoids; and Colonoscopy. Her family history includes ADD / ADHD in her son; Breast cancer in her maternal aunt, maternal aunt, maternal aunt, maternal aunt, maternal aunt, maternal grandmother, and mother; Colon cancer in her maternal grandfather; Heart disease in her father; Irritable bowel syndrome in her sister; Lung cancer in her father; Ovarian cancer in her maternal aunt and maternal grandmother; Prostate cancer in her maternal grandfather. She  reports that she has never smoked. She has never used smokeless tobacco. She reports that she does not drink alcohol and does not use drugs. She has a current medication list which includes the  following prescription(s): alprazolam, AMBULATORY NON FORMULARY MEDICATION, rosuvastatin, scopolamine, tizanidine, topiramate, triamcinolone cream, and venlafaxine xr. Current Outpatient Medications on File Prior to Visit  Medication Sig Dispense Refill   ALPRAZolam (XANAX) 1 MG tablet 1/2-1 tid as needed 90 tablet 0   AMBULATORY NON FORMULARY MEDICATION Medication Name: Nitroglycerin 0.125% gel. Apply pea size amount to the rectum three times a day as needed 30 g 0   rosuvastatin (CRESTOR) 5 MG tablet Take 1 tablet by mouth daily.     scopolamine (TRANSDERM-SCOP) 1 MG/3DAYS Place 1 patch (1.5 mg total) onto the skin every 3 (three) days. 10 patch 12   tiZANidine (ZANAFLEX) 4 MG tablet TAKE 1 TABLET BY MOUTH TWICE DAILY AS NEEDED 60 tablet 1   topiramate (TOPAMAX) 100 MG tablet Take 1 tablet (100 mg total) by mouth daily. 30 tablet 2   triamcinolone cream (KENALOG) 0.1 % Apply 1 application. topically 2 (two) times daily. 45 g 3   venlafaxine XR (EFFEXOR-XR) 37.5 MG 24 hr capsule Take 1 capsule (37.5 mg total) by mouth daily with breakfast. 90 capsule 3   No current facility-administered medications on file prior to visit.   She is allergic to ampicillin, cymbalta [duloxetine hcl], doxycycline, fentanyl citrate, and penicillins..  Review of Systems Review of Systems  Constitutional: Negative for activity change, appetite change and fatigue.  HENT: Negative for hearing loss, congestion, tinnitus and ear discharge.  dentist q24m Eyes: Negative for visual disturbance (see optho q1y -- vision corrected to 20/20 with glasses).  Respiratory: Negative for cough, chest tightness and shortness of breath.   Cardiovascular: Negative for chest pain, palpitations and leg swelling.  Gastrointestinal: Negative for abdominal pain, diarrhea, constipation and abdominal distention.  Genitourinary: Negative for urgency, frequency, decreased urine volume and difficulty urinating.  Musculoskeletal: Negative  gait  problem. ---- hip pain  Skin: Negative for color change, pallor and rash.  Neurological: Negative for dizziness, light-headedness, numbness and headaches.  Hematological: Negative for adenopathy. Does not bruise/bleed easily.  Psychiatric/Behavioral: Negative for suicidal ideas, confusion, sleep disturbance, self-injury, dysphoric mood, decreased concentration and agitation.       Objective:    BP 110/70 (BP Location: Left Arm, Patient Position: Sitting, Cuff Size: Normal)   Pulse 76   Temp 98.1 F (36.7 C) (Oral)   Resp 16   Ht 5\' 4"  (1.626 m)   Wt 126 lb 12.8 oz (57.5 kg)   LMP  (LMP Unknown)   SpO2 99%   BMI 21.77 kg/m  General appearance: alert, cooperative, and appears stated age Eyes: conjunctivae/corneas clear. PERRL, EOM's intact. Fundi benign. Ears: normal TM's and external ear canals both ears Nose: Nares normal. Septum midline. Mucosa normal. No drainage or sinus tenderness. Throat: lips, mucosa, and tongue normal; teeth and gums normal Neck: no adenopathy, no carotid bruit, no JVD, supple, symmetrical, trachea midline, and thyroid not enlarged, symmetric, no tenderness/mass/nodules Back: symmetric, no curvature. ROM normal. No CVA tenderness. Lungs: clear to auscultation bilaterally Heart: regular rate and rhythm, S1, S2 normal, no murmur, click, rub or gallop Extremities: extremities normal, atraumatic, no cyanosis or edema  Predictors of intubation difficulty:  Morbid obesity? no  Anatomically abnormal facies? no  Prominent incisors? no  Receding mandible? no  Short, thick neck? no  Neck range of motion: normal  Mallampati score: I (soft palate, uvula, fauces, and tonsillar pillars visible)   Dentition: No chipped, loose, or missing teeth.  Cardiographics ECG: normal sinus rhythm, no blocks or conduction defects, no ischemic changes Echocardiogram: not done  Imaging Chest x-ray: normal   Lab Review  Scanned Document on 12/03/2023  Component Date Value    A1c 09/23/2023 5.6    TSH 09/23/2023 0.71    EGFR 09/23/2023 86.0   Lab on 10/24/2023  Component Date Value   Rheumatoid fact SerPl-aC* 10/24/2023 <10       Assessment:      58 y.o. female with planned surgery as above.   Known risk factors for perioperative complications: None   Difficulty with intubation is not anticipated.  Cardiac Risk Estimation: LOW     Plan:    1. Preoperative workup as follows ECG, hemoglobin, hematocrit, electrolytes, creatinine, glucose, liver function studies, coagulation studies,----- labs done by GYN . 2. Change in medication regimen before surgery:  per surgical team . 3. Prophylaxis for cardiac events with perioperative beta-blockers: not indicated. 4. Invasive hemodynamic monitoring perioperatively: not indicated. 5. Deep vein thrombosis prophylaxis postoperatively:regimen to be chosen by surgical team. 6. Surveillance for postoperative MI with ECG immediately postoperatively and on postoperative days 1 and 2 AND troponin levels 24 hours postoperatively and on day 4 or hospital discharge (whichever comes first): not indicated.+  Assessment and Plan Assessment & Plan Sacroiliac Joint Dysfunction   Chronic sacroiliac joint dysfunction with left-sided pain persists since 2019, unresponsive to conservative management including injections, physical therapy, and chiropractic care. Bulging discs at L4 and L5 contribute to the condition. Sacroiliac joint fusion surgery is under consideration, with an anticipated 8-week recovery and subsequent physical therapy. Anesthesia concerns and the impact of fibromyalgia were discussed. She is exploring all options, including ongoing chiropractic care with Dr. Christell Faith. Continue chiropractic care with Dr. Christell Faith. Consider sacroiliac joint fusion surgery with Dr. Shon Baton. Discuss anesthesia concerns with the surgical team. Complete pre-surgical labs and clearance.  Fibromyalgia   Chronic fibromyalgia  contributes to  overall pain and discomfort, affecting the management of sacroiliac joint dysfunction and potential surgery.  General Health Maintenance   Routine health maintenance requires updated labs and potential pre-surgical clearance due to age. Previous labs from Dr. Lisbeth Ply office are pending review. Review and update labs from Dr. Lisbeth Ply office. Perform EKG for pre-surgical clearance.  Follow-up   Follow-up plans are in the context of upcoming surgery and chiropractic care. She is planning a cruise and will coordinate follow-up post-return. Schedule a follow-up appointment after the cruise and chiropractic care. Coordinate with the surgical team for surgery scheduling and pre-operative requirements.

## 2023-12-17 ENCOUNTER — Encounter: Payer: Federal, State, Local not specified - PPO | Admitting: Gastroenterology

## 2023-12-30 ENCOUNTER — Ambulatory Visit
Admission: RE | Admit: 2023-12-30 | Discharge: 2023-12-30 | Disposition: A | Discharge status: Home / Self Care | Source: Ambulatory Visit | Attending: Obstetrics and Gynecology | Admitting: Obstetrics and Gynecology

## 2023-12-30 ENCOUNTER — Encounter: Payer: Self-pay | Admitting: Gastroenterology

## 2023-12-30 DIAGNOSIS — Z803 Family history of malignant neoplasm of breast: Secondary | ICD-10-CM

## 2023-12-30 MED ORDER — GADOPICLENOL 0.5 MMOL/ML IV SOLN
5.0000 mL | Freq: Once | INTRAVENOUS | Status: AC | PRN
  Administered 2023-12-30: 5 mL via INTRAVENOUS

## 2024-01-05 ENCOUNTER — Other Ambulatory Visit: Payer: Self-pay | Admitting: Gastroenterology

## 2024-01-05 DIAGNOSIS — D122 Benign neoplasm of ascending colon: Secondary | ICD-10-CM | POA: Diagnosis not present

## 2024-01-05 DIAGNOSIS — D123 Benign neoplasm of transverse colon: Secondary | ICD-10-CM | POA: Diagnosis not present

## 2024-01-06 ENCOUNTER — Encounter: Payer: Self-pay | Admitting: Certified Registered Nurse Anesthetist

## 2024-01-08 ENCOUNTER — Ambulatory Visit: Payer: Federal, State, Local not specified - PPO | Admitting: Gastroenterology

## 2024-01-08 ENCOUNTER — Encounter: Payer: Self-pay | Admitting: Gastroenterology

## 2024-01-08 VITALS — BP 117/90 | HR 70 | Temp 98.1°F | Resp 14 | Ht 64.0 in | Wt 126.0 lb

## 2024-01-08 DIAGNOSIS — K5730 Diverticulosis of large intestine without perforation or abscess without bleeding: Secondary | ICD-10-CM

## 2024-01-08 DIAGNOSIS — D123 Benign neoplasm of transverse colon: Secondary | ICD-10-CM

## 2024-01-08 DIAGNOSIS — Z860101 Personal history of adenomatous and serrated colon polyps: Secondary | ICD-10-CM | POA: Diagnosis not present

## 2024-01-08 DIAGNOSIS — D122 Benign neoplasm of ascending colon: Secondary | ICD-10-CM

## 2024-01-08 DIAGNOSIS — Z860100 Personal history of colon polyps, unspecified: Secondary | ICD-10-CM

## 2024-01-08 DIAGNOSIS — Z1211 Encounter for screening for malignant neoplasm of colon: Secondary | ICD-10-CM | POA: Diagnosis present

## 2024-01-08 DIAGNOSIS — K648 Other hemorrhoids: Secondary | ICD-10-CM | POA: Diagnosis not present

## 2024-01-08 MED ORDER — SODIUM CHLORIDE 0.9 % IV SOLN: 500.0000 mL | Freq: Once | INTRAVENOUS | Status: DC

## 2024-01-08 NOTE — Patient Instructions (Addendum)

## 2024-01-08 NOTE — Progress Notes (Signed)
 Report given to PACU, vss

## 2024-01-08 NOTE — Progress Notes (Signed)
 History and Physical:  This patient presents for endoscopic testing for: Encounter Diagnosis  Name Primary?   Hx of colonic polyps Yes    Surveillance for many adenomatous colon polyps found April 2024 Adenomatous polyps (but fewer in number) on 2023 and prior colonoscopies by Dr Howard Macho. Patient denies chronic abdominal pain, rectal bleeding, constipation or diarrhea.   Patient is otherwise without complaints or active issues today.   Past Medical History: Past Medical History:  Diagnosis Date   AAT (alpha-1-antitrypsin) deficiency (HCC)    Anal fissure    Anxiety    Asthma    uses daily   Cancer (HCC)    per gentic test   Chronic interstitial cystitis    Crohn disease (HCC)    Fibromyalgia    Macular degeneration disease    Migraines    Pneumonia 2007   SLE (systemic lupus erythematosus) (HCC)      Past Surgical History: Past Surgical History:  Procedure Laterality Date   adnoids     BLADDER SURGERY     "couple"   CESAREAN SECTION     COLONOSCOPY     CYSTOSCOPY     KNEE ARTHROSCOPY     "COUPLE"   LAPAROSCOPIC VAGINAL HYSTERECTOMY WITH SALPINGO OOPHORECTOMY Bilateral 01/24/2020   Procedure: LAPAROSCOPIC ASSISTED VAGINAL HYSTERECTOMY WITH BILATERAL OOPHORECTOMY AND LEFT  SALPINGECTOMY;  Surgeon: Merryl Abraham, MD;  Location: [facility];  Service: Gynecology;  Laterality: Bilateral;  need bed   SALPINGECTOMY Right    right   TONSILLECTOMY     WRIST SURGERY Left    x 2    Allergies: Allergies  Allergen Reactions   Ampicillin Diarrhea and Nausea And Vomiting    Fever   Cymbalta  [Duloxetine  Hcl] Nausea Only    Constipation, bloating   Doxycycline  Diarrhea   Fentanyl Citrate Nausea And Vomiting    fever   Penicillins Diarrhea and Nausea And Vomiting    Fever    Outpatient Meds: Current Outpatient Medications  Medication Sig Dispense Refill   ALPRAZolam  (XANAX ) 1 MG tablet 1/2-1 tid as needed 90 tablet 0   rosuvastatin (CRESTOR) 5 MG  tablet Take 1 tablet by mouth daily.     topiramate  (TOPAMAX ) 100 MG tablet Take 1 tablet (100 mg total) by mouth daily. 30 tablet 2   venlafaxine  XR (EFFEXOR -XR) 37.5 MG 24 hr capsule Take 1 capsule (37.5 mg total) by mouth daily with breakfast. 90 capsule 3   AMBULATORY NON FORMULARY MEDICATION Medication Name: Nitroglycerin  0.125% gel. Apply pea size amount to the rectum three times a day as needed 30 g 0   scopolamine  (TRANSDERM-SCOP) 1 MG/3DAYS Place 1 patch (1.5 mg total) onto the skin every 3 (three) days. 10 patch 12   tiZANidine  (ZANAFLEX ) 4 MG tablet TAKE 1 TABLET BY MOUTH TWICE DAILY AS NEEDED 60 tablet 1   triamcinolone  cream (KENALOG ) 0.1 % Apply 1 application. topically 2 (two) times daily. 45 g 3   Current Facility-Administered Medications  Medication Dose Route Frequency Provider Last Rate Last Admin   0.9 %  sodium chloride  infusion  500 mL Intravenous Once Danis, Zvi Duplantis L III, MD          ___________________________________________________________________ Objective   Exam:  BP (!) 129/100   Pulse 78   Temp 98.1 F (36.7 C) (Temporal)   Resp 13   Ht 5\' 4"  (1.626 m)   Wt 126 lb (57.2 kg)   LMP  (LMP Unknown)   SpO2 97%   BMI 21.63 kg/m  CV: regular , S1/S2 Resp: clear to auscultation bilaterally, normal RR and effort noted GI: soft, no tenderness, with active bowel sounds.   Assessment: Encounter Diagnosis  Name Primary?   Hx of colonic polyps Yes     Plan: Colonoscopy   The benefits and risks of the planned procedure(s) were described in detail with the patient or (when appropriate) their health care proxy.  Risks were outlined as including, but not limited to, bleeding, infection, perforation, adverse medication reaction leading to cardiac or pulmonary decompensation, pancreatitis (if ERCP).  The limitation of incomplete mucosal visualization was also discussed.  No guarantees or warranties were given.  The patient is appropriate for an endoscopic  procedure in the ambulatory setting.   - Lorella Roles, MD

## 2024-01-08 NOTE — Progress Notes (Signed)
 Called to room to assist during endoscopic procedure.  Patient ID and intended procedure confirmed with present staff. Received instructions for my participation in the procedure from the performing physician.

## 2024-01-08 NOTE — Op Note (Signed)
 [facility] Patient Name: Sheila [name] Procedure Date: 01/08/2024 10:28 AM MRN: [id number] Endoscopist: Ace Abu L. Dominic Friendly , MD, [id number] Age: 58 Referring MD:  Date of Birth: 03/12/1966 Gender: Female Account #: [id number] Procedure:                Colonoscopy Indications:              Surveillance: History of numerous (> 10) adenomas                            on last colonoscopy (< 3 yrs)                           Many right colon tubular adenomas April 2024 (over                            20)                           5 subcentimeter tubular adenoma May 2023 and 5                            tubular adenoma tubular adenoma February 2020 Medicines:                Monitored Anesthesia Care Procedure:                Pre-Anesthesia Assessment:                           - Prior to the procedure, a History and Physical                            was performed, and patient medications and                            allergies were reviewed. The patient's tolerance of                            previous anesthesia was also reviewed. The risks                            and benefits of the procedure and the sedation                            options and risks were discussed with the patient.                            All questions were answered, and informed consent                            was obtained. Prior Anticoagulants: The patient has                            taken no anticoagulant or antiplatelet agents. ASA  Grade Assessment: II - A patient with mild systemic                            disease. After reviewing the risks and benefits,                            the patient was deemed in satisfactory condition to                            undergo the procedure.                           After obtaining informed consent, the colonoscope                            was passed under direct vision. Throughout the                             procedure, the patient's blood pressure, pulse, and                            oxygen saturations were monitored continuously. The                            CF [id number] #[id number] was introduced through the anus                            and advanced to the the cecum, identified by                            appendiceal orifice and ileocecal valve. The                            colonoscopy was performed without difficulty. The                            patient tolerated the procedure well. The quality                            of the bowel preparation was good. The ileocecal                            valve, appendiceal orifice, and rectum were                            photographed. Scope In: 10:58:39 AM Scope Out: 11:33:38 AM Scope Withdrawal Time: 0 hours 31 minutes 52 seconds  Total Procedure Duration: 0 hours 34 minutes 59 seconds  Findings:                 The perianal and digital rectal examinations were                            normal.  Examination of the entire colon under WL and NBI                            performed.                           Multiple (12) flat, sessile and semi-sessile polyps                            were found in the ascending colon. The polyps were                            diminutive in size. These polyps were removed with                            a cold snare. Resection and retrieval were complete.                           Six sessile and semi-sessile polyps were found in                            the ascending colon. The polyps were diminutive in                            size. These polyps were removed with a cold snare.                            Resection and retrieval were complete.                           Multiple (10-14) sessile and semi-sessile polyps                            were found in the proximal transverse colon. The                            polyps were diminutive in size. These polyps were                             removed with a cold snare. Resection and retrieval                            were complete.                           Multiple (10-12) sessile and semi-sessile polyps                            were found in the distal transverse colon. The                            polyps were diminutive in size. These polyps were  removed with a cold snare. Resection and retrieval                            were complete.                           In all areas noted above, there were some polypoid                            mucosal prominences that are quite small and                            difficult to tell if true polyp tissue. However,                            the vast majority did appear likely adenomatous                            under WL and NBI appearance. As have been found on                            the last colonoscopy, they were often in small                            clusters and multiple would be removed in a single                            snare specimen.                           A few small-mouthed diverticula were found in the                            sigmoid colon.                           Internal hemorrhoids were found. The hemorrhoids                            were small. Complications:            No immediate complications. Estimated Blood Loss:     Estimated blood loss was minimal. Impression:               - Multiple (12) diminutive polyps in the ascending                            colon, removed with a cold snare. Resected and                            retrieved.                           - Six diminutive polyps in the ascending colon,  removed with a cold snare. Resected and retrieved.                           - Multiple diminutive polyps in the proximal                            transverse colon, removed with a cold snare.                            Resected and retrieved.                            - Multiple diminutive polyps in the distal                            transverse colon, removed with a cold snare.                            Resected and retrieved.                           - Diverticulosis in the sigmoid colon.                           - Internal hemorrhoids. Recommendation:           - Patient has a contact number available for                            emergencies. The signs and symptoms of potential                            delayed complications were discussed with the                            patient. Return to normal activities tomorrow.                            Written discharge instructions were provided to the                            patient.                           - Resume previous diet.                           - Continue present medications.                           - Await pathology results.                           - Repeat colonoscopy in 6 months for surveillance.                           - Refer to a genetics  counselor to test for colon                            polyposis syndromes. Ahlaya Ende L. Dominic Friendly, MD 01/08/2024 11:48:58 AM This report has been signed electronically.

## 2024-01-09 ENCOUNTER — Telehealth: Payer: Self-pay

## 2024-01-09 DIAGNOSIS — K635 Polyp of colon: Secondary | ICD-10-CM

## 2024-01-09 NOTE — Telephone Encounter (Signed)
  Follow up Call-     01/08/2024   10:12 AM 12/23/2022    9:35 AM 02/05/2022    8:38 AM  Call back number  Post procedure Call Back phone  # [phone]  Permission to leave phone message Yes Yes Yes     Patient questions:  Do you have a fever, pain , or abdominal swelling? No. Pain Score  0 *  Have you tolerated food without any problems? Yes.    Have you been able to return to your normal activities? Yes.    Do you have any questions about your discharge instructions: Diet   No. Medications  No. Follow up visit  No.  Do you have questions or concerns about your Care? No.  Actions: * If pain score is 4 or above: No action needed, pain <4.

## 2024-01-09 NOTE — Telephone Encounter (Signed)
 I spoke with [name] and told her I have placed the genetics counselor referral and they will contact her to set that up.

## 2024-01-12 ENCOUNTER — Other Ambulatory Visit: Payer: Self-pay | Admitting: Family Medicine

## 2024-01-12 DIAGNOSIS — G43009 Migraine without aura, not intractable, without status migrainosus: Secondary | ICD-10-CM

## 2024-01-13 ENCOUNTER — Encounter: Payer: Self-pay | Admitting: Gastroenterology

## 2024-01-13 LAB — SURGICAL PATHOLOGY

## 2024-01-14 ENCOUNTER — Telehealth: Payer: Self-pay | Admitting: Gastroenterology

## 2024-01-14 NOTE — Telephone Encounter (Signed)
 Noted.

## 2024-01-14 NOTE — Telephone Encounter (Signed)
FYI Dr Loletha Carrow

## 2024-01-14 NOTE — Telephone Encounter (Signed)
 Patient called and stated that she has already had a genetic testing done with OBGYN back in 2021. Patient stated that she will have her OBGYN fax over those results to Dr. Dominic Friendly. Please advise.

## 2024-01-14 NOTE — Telephone Encounter (Signed)
 Yes, she told me that after the procedure.  I will look at that testing after we receive it, but she may yet need to see the genetic counselor if she needs testing that is specific to colon polyp and other GI cancer syndromes.  H Danis

## 2024-01-20 NOTE — Telephone Encounter (Signed)
 I spoke with [name] and she has spoken with the schedulers in genetics and they told her that since she had genetic counseling in 2020 with her GYN she may not need it again since her genes are not going to change. I told her we have the GYN results and we will see if this information gives Dr Dominic Friendly what he needs. She is aware he is away from the office this week. They closed out the referral I placed so if she needs to do the genetics testing again a new referral will need to be placed.

## 2024-01-26 NOTE — Telephone Encounter (Signed)
 EGD recall entered and I went over Dr Revonda Castles message with [name]. I have mailed her a copy of the message for her to have. Confirmed address.

## 2024-01-26 NOTE — Telephone Encounter (Signed)
 Thank you for the update, and I reviewed the 2020 genetic test results.  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  As she and I have discussed, her clinical picture is still highly suggestive of a colon polyposis syndrome.  Current genetic science cannot tell us  whether or not it may be related to this PMS2 variant or some other genetic mutation that has not yet been discovered.  Considering all that, I still recommend she have the colonoscopy with me at a 90-month interval (late October/early November of this year.  In addition, since colon polyp syndromes may have associated upper GI polyps, I recommend she have an upper endoscopy with me at that same time.  I believe the colonoscopy recall was already placed after the most recent colonoscopy.  Please check that it was, and also add an EGD to that same recall.  I would greatly appreciate you conveying this to [name] since I am sure she is concerned.  Memory Staggers, MD

## 2024-03-22 ENCOUNTER — Other Ambulatory Visit: Payer: Self-pay | Admitting: Family Medicine

## 2024-03-22 DIAGNOSIS — N3010 Interstitial cystitis (chronic) without hematuria: Secondary | ICD-10-CM

## 2024-03-22 NOTE — Telephone Encounter (Signed)
 Requesting: Xanax  Contract: n/a UDS: n/a Last OV: 12/16/23 Next OV: n/a Last Refill: 11/18/23, #90--0 RF Database:   Please advise

## 2024-04-06 ENCOUNTER — Other Ambulatory Visit: Payer: Self-pay | Admitting: Family Medicine

## 2024-04-06 DIAGNOSIS — G43009 Migraine without aura, not intractable, without status migrainosus: Secondary | ICD-10-CM

## 2024-04-07 ENCOUNTER — Other Ambulatory Visit: Payer: Self-pay | Admitting: Family Medicine

## 2024-04-07 DIAGNOSIS — G43009 Migraine without aura, not intractable, without status migrainosus: Secondary | ICD-10-CM

## 2024-04-08 ENCOUNTER — Other Ambulatory Visit: Payer: Self-pay | Admitting: Family Medicine

## 2024-04-08 DIAGNOSIS — G43009 Migraine without aura, not intractable, without status migrainosus: Secondary | ICD-10-CM

## 2024-05-01 ENCOUNTER — Encounter: Payer: Self-pay | Admitting: Family Medicine

## 2024-05-03 ENCOUNTER — Ambulatory Visit: Payer: Self-pay

## 2024-05-03 ENCOUNTER — Other Ambulatory Visit: Payer: Self-pay | Admitting: Family Medicine

## 2024-05-03 DIAGNOSIS — R109 Unspecified abdominal pain: Secondary | ICD-10-CM

## 2024-05-03 DIAGNOSIS — E785 Hyperlipidemia, unspecified: Secondary | ICD-10-CM

## 2024-05-03 NOTE — Telephone Encounter (Signed)
 Noted

## 2024-05-03 NOTE — Telephone Encounter (Signed)
 FYI Only or Action Required?: Action required by provider: Patient requesting lab work and urinalysis at appointment.  Patient was last seen in primary care on 12/16/2023 by Sheila [name], Sheila SAUNDERS, DO.  Called Nurse Triage reporting Back Pain.  Symptoms began several weeks ago.  Interventions attempted: OTC medications: Ibuprofen and Ice/heat application.  Symptoms are: gradually worsening.  Triage Disposition: See PCP When Office is Open (Within 3 Days)  Patient/caregiver understands and will follow disposition?: Yes           Copied from CRM #[id number]. Topic: Clinical - Red Word Triage >> May 03, 2024  3:34 PM Deleta RAMAN wrote: Red Word that prompted transfer to Nurse Triage: patient is calling to follow up on message below sent to pcp on 8/16 been experiencing for about 2 weeks. Pain has worsen. Believe she passed a kidney stone    Good evening, Dr. Melonie  I'd like to have blood work done prior to coming to see you, so that we can have the results ready at the appointment to go over. In addition to updating my bloodwork from Dr. Mardelle office that you requested we do in 6 months following my visit with him in January, I am having lower left back pain. I am afraid this may be my kidney. The pain has been going on for some time. It is tender to the touch, I cannot lie on my left side, I've been very fatigued, urine is cloudy and off scent, dizzy and sick to my stomach. Just not much of an appetite.  In addition to all the auto immune issues, I felt this should be looked at as well. If you could put in an appointment for the lab work first I'd greatly appreciate it. Thanks so much. Have a good weekend. ~ Sheila [name] Reason for Disposition  [1] MODERATE back pain (e.g., interferes with normal activities) AND [2] present > 3 days  Answer Assessment - Initial Assessment Questions L lower back, unable to lay on side x 2 weeks. Passed a black kidney stone size of wedding ring diamond. Fells  like a stabbing feeling in back. Pt states pain is on same side where she has to have back surgery. Used ice/ heat for symptoms. Fatigue, lack of appetite, felt hot and nausea. Pt requesting blood work and urinalysis done before appointment,et she has scheduled.      1. ONSET: When did the pain begin? (e.g., minutes, hours, days)     2 weeks ago 2. LOCATION: Where does it hurt? (upper, mid or lower back)     L lower back pain  3. SEVERITY: How bad is the pain?  (e.g., Scale 1-10; mild, moderate, or severe)     7/10 4. PATTERN: Is the pain constant? (e.g., yes, no; constant, intermittent)      Building pressure and comes in waves  5. RADIATION: Does the pain shoot into your legs or somewhere else?     No 6. CAUSE:  What do you think is causing the back pain?      Possible kidney stone  7. MEDICINES: What have you taken so far for the pain? (e.g., nothing, acetaminophen , NSAIDS)     Tylenol  and Advil  8. NEUROLOGIC SYMPTOMS: Do you have any weakness, numbness, or problems with bowel/bladder control?     States she has to rush to the restroom  9. OTHER SYMPTOMS: Do you have any other symptoms? (e.g., fever, abdomen pain, burning with urination, blood in urine)  Burning with urination and blood in urine, pt states this is normal for her due to a dx she has. Pt states she has noticed a new odor to her urine and urinary frequency.  Protocols used: Back Pain-A-AH

## 2024-05-04 ENCOUNTER — Other Ambulatory Visit: Payer: Self-pay | Admitting: Family

## 2024-05-04 ENCOUNTER — Ambulatory Visit (HOSPITAL_BASED_OUTPATIENT_CLINIC_OR_DEPARTMENT_OTHER)
Admission: RE | Admit: 2024-05-04 | Discharge: 2024-05-04 | Disposition: A | Discharge status: Home / Self Care | Source: Ambulatory Visit | Attending: Family | Admitting: Family

## 2024-05-04 ENCOUNTER — Ambulatory Visit: Payer: Self-pay | Admitting: Family

## 2024-05-04 ENCOUNTER — Ambulatory Visit: Admitting: Family

## 2024-05-04 ENCOUNTER — Encounter: Payer: Self-pay | Admitting: Family

## 2024-05-04 ENCOUNTER — Telehealth: Payer: Self-pay

## 2024-05-04 VITALS — BP 112/74 | HR 78 | Ht 64.0 in | Wt 123.2 lb

## 2024-05-04 DIAGNOSIS — R109 Unspecified abdominal pain: Secondary | ICD-10-CM | POA: Diagnosis not present

## 2024-05-04 DIAGNOSIS — N200 Calculus of kidney: Secondary | ICD-10-CM

## 2024-05-04 LAB — CBC WITH DIFFERENTIAL/PLATELET
Basophils Absolute: 0 K/uL (ref 0.0–0.1)
Basophils Relative: 0.6 % (ref 0.0–3.0)
Eosinophils Absolute: 0.3 K/uL (ref 0.0–0.7)
Eosinophils Relative: 3.9 % (ref 0.0–5.0)
HCT: 42.9 % (ref 36.0–46.0)
Hemoglobin: 14.1 g/dL (ref 12.0–15.0)
Lymphocytes Relative: 29.3 % (ref 12.0–46.0)
Lymphs Abs: 2.2 K/uL (ref 0.7–4.0)
MCHC: 32.8 g/dL (ref 30.0–36.0)
MCV: 92.5 fl (ref 78.0–100.0)
Monocytes Absolute: 0.6 K/uL (ref 0.1–1.0)
Monocytes Relative: 7.5 % (ref 3.0–12.0)
Neutro Abs: 4.4 K/uL (ref 1.4–7.7)
Neutrophils Relative %: 58.7 % (ref 43.0–77.0)
Platelets: 330 K/uL (ref 150.0–400.0)
RBC: 4.64 Mil/uL (ref 3.87–5.11)
RDW: 13.8 % (ref 11.5–15.5)
WBC: 7.4 K/uL (ref 4.0–10.5)

## 2024-05-04 LAB — COMPREHENSIVE METABOLIC PANEL WITH GFR
ALT: 11 U/L (ref 0–35)
AST: 18 U/L (ref 0–37)
Albumin: 4.8 g/dL (ref 3.5–5.2)
Alkaline Phosphatase: 88 U/L (ref 39–117)
BUN: 17 mg/dL (ref 6–23)
CO2: 28 meq/L (ref 19–32)
Calcium: 9.7 mg/dL (ref 8.4–10.5)
Chloride: 105 meq/L (ref 96–112)
Creatinine, Ser: 0.81 mg/dL (ref 0.40–1.20)
GFR: 81.32 mL/min (ref >60.00)
Glucose, Bld: 93 mg/dL (ref 70–99)
Potassium: 4.9 meq/L (ref 3.5–5.1)
Sodium: 141 meq/L (ref 135–145)
Total Bilirubin: 0.5 mg/dL (ref 0.2–1.2)
Total Protein: 8 g/dL (ref 6.0–8.3)

## 2024-05-04 MED ORDER — SULFAMETHOXAZOLE-TRIMETHOPRIM 800-160 MG PO TABS: 1.0000 | ORAL_TABLET | Freq: Two times a day (BID) | ORAL | 0 refills | Status: DC

## 2024-05-04 MED ORDER — HYDROCODONE-ACETAMINOPHEN 5-325 MG PO TABS: 1.0000 | ORAL_TABLET | Freq: Four times a day (QID) | ORAL | 0 refills | Status: DC | PRN

## 2024-05-04 NOTE — Progress Notes (Signed)
 Sheila [name] is a 58 y.o. female with the following history as recorded in [employer]:  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   Bilateral hip pain 01/28/2022   Low back pain with radiation 01/28/2022   Preventative health care 01/28/2022   COVID-19 02/09/2021   Dyslipidemia 05/18/2020   Severe anxiety with panic 05/18/2020   Post traumatic stress disorder 05/18/2020   Severe episode of recurrent major depressive disorder, without psychotic features (HCC) 05/18/2020   Alpha-1-antitrypsin deficiency (HCC) 05/18/2020   Abnormal vaginal bleeding 01/24/2020   S/P laparoscopic assisted vaginal hysterectomy (LAVH) 01/24/2020   Chronic fatigue 01/05/2020   Epistaxis 11/19/2019   Dry eyes 09/08/2019   Mild persistent asthma without complication 03/09/2019   Hoarseness, persistent 02/27/2018   Reflux laryngitis 02/27/2018   Rectal pain 03/05/2017   H/O Crohn's disease 03/05/2017   Bloating 03/05/2017   Migraine without aura and without status migrainosus, not intractable 08/27/2016   Depression with anxiety 08/04/2016   Fibromyalgia 08/04/2016   Migraines 07/11/2015   Rash and nonspecific skin eruption 04/13/2015   Memory loss of unknown cause 04/13/2015   External hemorrhoid 02/25/2013   Left knee pain 05/04/2012   Anxiety state 11/26/2010   DEPRESSIVE DISORDER 01/11/2009   ABDOMINAL BLOATING 08/19/2008   Regional enteritis (HCC) 06/29/2008   LYMPHOPENIA 11/19/2007   Thyrotoxicosis 11/05/2007   Enlarged lymph nodes 11/03/2007   CHRONIC INTERSTITIAL CYSTITIS 08/04/2007   Fibromyalgia muscle pain 08/04/2007   Chest pain 08/04/2007   HEMATURIA, HX OF  08/04/2007   Systemic lupus erythematosus, unspecified SLE type, unspecified organ involvement status (HCC)     Current Outpatient Medications  Medication Sig Dispense Refill   ALPRAZolam  (XANAX ) 1 MG tablet TAKE 1/2 (0.5) TO 1 TABLET BY MOUTH 3 TIMES A DAY AS NEEDED 90 tablet 1   AMBULATORY NON FORMULARY MEDICATION Medication Name: Nitroglycerin  0.125% gel. Apply pea size amount to the rectum three times a day as needed 30 g 0   rosuvastatin (CRESTOR) 5 MG tablet Take 1 tablet by mouth daily.     scopolamine  (TRANSDERM-SCOP) 1 MG/3DAYS Place 1 patch (1.5 mg total) onto the skin every 3 (three) days. 10 patch 12   tiZANidine  (ZANAFLEX ) 4 MG tablet TAKE 1 TABLET BY MOUTH TWICE DAILY AS NEEDED 60 tablet 1   topiramate  (TOPAMAX ) 100 MG tablet TAKE 1 TABLET BY MOUTH DAILY 30 tablet 2   triamcinolone  cream (KENALOG ) 0.1 % Apply 1 application. topically 2 (two) times daily. 45 g 3   venlafaxine  XR (EFFEXOR -XR) 37.5 MG 24 hr capsule Take 1 capsule (37.5 mg total) by mouth daily with breakfast. 90 capsule 3   No current facility-administered medications for this visit.    Allergies: Ampicillin, Cymbalta  [duloxetine  hcl], Doxycycline , Fentanyl citrate, and Penicillins  Past Medical History:  Diagnosis Date   AAT (alpha-1-antitrypsin) deficiency (HCC)    Anal fissure    Anxiety    Asthma    uses daily   Cancer (HCC)    per gentic test   Chronic interstitial cystitis    Crohn disease (HCC)    Fibromyalgia    Macular degeneration disease    Migraines    Pneumonia 2007   SLE (systemic lupus erythematosus) (HCC)  Past Surgical History:  Procedure Laterality Date   adnoids     BLADDER SURGERY     couple   CESAREAN SECTION     COLONOSCOPY     CYSTOSCOPY     KNEE ARTHROSCOPY     COUPLE   LAPAROSCOPIC VAGINAL HYSTERECTOMY WITH SALPINGO OOPHORECTOMY Bilateral 01/24/2020   Procedure: LAPAROSCOPIC ASSISTED VAGINAL HYSTERECTOMY WITH BILATERAL OOPHORECTOMY AND LEFT  SALPINGECTOMY;   Surgeon: Leva Rush, MD;  Location: [facility] [place];  Service: Gynecology;  Laterality: Bilateral;  need bed   SALPINGECTOMY Right    right   TONSILLECTOMY     WRIST SURGERY Left    x 2    Family History  Problem Relation Age of Onset   Breast cancer Mother    Lung cancer Father        Died in 05/16/12   Heart disease Father    Irritable bowel syndrome Sister    Breast cancer Maternal Aunt    Breast cancer Maternal Aunt    Ovarian cancer Maternal Aunt    Breast cancer Maternal Aunt    Breast cancer Maternal Aunt    Breast cancer Maternal Aunt    Breast cancer Maternal Grandmother    Ovarian cancer Maternal Grandmother    Prostate cancer Maternal Grandfather    Colon cancer Maternal Grandfather    ADD / ADHD Son    Colon polyps Neg Hx    Esophageal cancer Neg Hx    Kidney disease Neg Hx    Gallbladder disease Neg Hx    Rectal cancer Neg Hx    Stomach cancer Neg Hx    Crohn's disease Neg Hx     Social History   Tobacco Use   Smoking status: Never   Smokeless tobacco: Never  Substance Use Topics   Alcohol use: No    Alcohol/week: 0.0 standard drinks of alcohol    Subjective:   Patient presents with concerns for persisting left flank pain- feels that she recently passed a kidney stone but symptoms have persisted; cannot lie on left side due to pain; no blood in urine but feels that urine is cloudy/ strong smell.   Objective:  Vitals:   05/04/24 0858  BP: 112/74  Pulse: 78  SpO2: 98%  Weight: 123 lb 3.2 oz (55.9 kg)  Height: 5' 4 (1.626 m)    General: Well developed, well nourished, in no acute distress  Skin : Warm and dry.  Head: Normocephalic and atraumatic  Eyes: Sclera and conjunctiva clear; pupils round and reactive to light; extraocular movements intact  Ears: External normal; canals clear; tympanic membranes normal  Oropharynx: Pink, supple. No suspicious lesions  Neck: Supple without thyromegaly, adenopathy  Lungs: Respirations unlabored;  clear to auscultation bilaterally without wheeze, rales, rhonchi  CVS exam: normal rate and regular rhythm.  Neurologic: Alert and oriented; speech intact; face symmetrical; moves all extremities well; CNII-XII intact without focal deficit   Assessment:  1. Flank pain   2. Abdominal pain, unspecified abdominal location     Plan:  ? Renal stone; check CBC, CMP, U/A and urine culture; STAT renal stone CT is ordered today- follow up to be determined by results; if this exam is unremarkable, to consider referral back to her orthopedist.   No follow-ups on file.  Orders Placed This Encounter  Procedures   Urine Culture   CT RENAL STONE STUDY    Standing Status:   Future    Expected Date:   05/04/2024    Expiration  Date:   07/04/2024    Is patient pregnant?:   No    Preferred imaging location?:   [facility]   CBC with Differential/Platelet   Comp Met (CMET)   Urinalysis    Requested Prescriptions    No prescriptions requested or ordered in this encounter

## 2024-05-04 NOTE — Telephone Encounter (Signed)
 Copied from CRM #[id number]. Topic: General - Other >> May 04, 2024  2:22 PM Fonda T wrote: Reason for CRM: Received call from patient, requesting to speak with nurse of provider, Harlene.   Patient reports she spoke to Dr. Janit office he is currently out on medical leave, so a new referral will be needed for a local trusted urologist as soon as possible.  Patient is requesting a follow up call. Patient can be reached at [phone].

## 2024-05-04 NOTE — Telephone Encounter (Signed)
 Copied from CRM #[id number]. Topic: Clinical - Lab/Test Results >> May 04, 2024  4:13 PM Harlene ORN wrote: Reason for CRM: patient is calling to follow up on her test results

## 2024-05-05 LAB — URINE CULTURE
MICRO NUMBER:: 16851846
SPECIMEN QUALITY:: ADEQUATE

## 2024-05-06 ENCOUNTER — Encounter: Payer: Self-pay | Admitting: Urology

## 2024-05-06 ENCOUNTER — Encounter: Payer: Self-pay | Admitting: Gastroenterology

## 2024-05-06 ENCOUNTER — Ambulatory Visit: Admitting: Urology

## 2024-05-06 VITALS — BP 113/74 | HR 91 | Ht 64.0 in | Wt 120.0 lb

## 2024-05-06 DIAGNOSIS — R109 Unspecified abdominal pain: Secondary | ICD-10-CM

## 2024-05-06 DIAGNOSIS — N200 Calculus of kidney: Secondary | ICD-10-CM | POA: Diagnosis not present

## 2024-05-06 LAB — URINALYSIS, ROUTINE W REFLEX MICROSCOPIC
Bilirubin, UA: NEGATIVE
Glucose, UA: NEGATIVE
Ketones, UA: NEGATIVE
Nitrite, UA: NEGATIVE
Protein,UA: NEGATIVE
Specific Gravity, UA: 1.02 (ref 1.005–1.030)
Urobilinogen, Ur: 0.2 mg/dL (ref 0.2–1.0)
pH, UA: 6.5 (ref 5.0–7.5)

## 2024-05-06 LAB — MICROSCOPIC EXAMINATION

## 2024-05-06 NOTE — Progress Notes (Signed)
 Assessment: 1. Nephrolithiasis, left   2. Flank pain, left: Likely musculoskeletal     Plan: I personally reviewed the patient's chart including provider notes, lab and imaging results. I personally reviewed the CT study from 05/04/2024 with results as noted below. The left renal calculus is extremely small and is not causing any obstruction.  This is definitely not the cause of any flank pain.  Her flank pain seems to be more musculoskeletal in nature. I discussed prevention of kidney stones with the patient today.  Information was provided. She follows with Dr. Janit for management of her interstitial cystitis. Return to office prn  Chief Complaint:  Chief Complaint  Patient presents with   Nephrolithiasis    History of Present Illness:  Sheila [name] is a 58 y.o. female who is seen in consultation from Leita Elbe, FNP for evaluation of left flank pain. Noted onset of some left-sided flank pain approximately 1 week ago.  She reports passing a stone approximately 1 week ago.  She continued to have some left-sided pain.  Her pain is increased with movement and certain position.  No change in urinary symptoms.  No dysuria or gross hematuria. She was seen in the office for evaluation of persistent left flank pain. CT imaging from 05/04/2024 showed a punctate left renal calculus without obstruction, no right renal calculi, no renal mass, or evidence of obstruction.  She has a history of interstitial cystitis and has been managed by Dr. Lamar Janit at [facility].  She has baseline urinary symptoms of frequency, urgency, nocturia, and occasional dysuria.  Past Medical History:  Past Medical History:  Diagnosis Date   AAT (alpha-1-antitrypsin) deficiency (HCC)    Anal fissure    Anxiety    Asthma    uses daily   Cancer (HCC)    per gentic test   Chronic interstitial cystitis    Crohn disease (HCC)    Fibromyalgia    Macular degeneration disease    Migraines    Pneumonia  Jun 01, 2006   SLE (systemic lupus erythematosus) (HCC)     Past Surgical History:  Past Surgical History:  Procedure Laterality Date   adnoids     BLADDER SURGERY     couple   CESAREAN SECTION     COLONOSCOPY     CYSTOSCOPY     KNEE ARTHROSCOPY     COUPLE   LAPAROSCOPIC VAGINAL HYSTERECTOMY WITH SALPINGO OOPHORECTOMY Bilateral 01/24/2020   Procedure: LAPAROSCOPIC ASSISTED VAGINAL HYSTERECTOMY WITH BILATERAL OOPHORECTOMY AND LEFT  SALPINGECTOMY;  Surgeon: Leva Rush, MD;  Location: [facility] [place];  Service: Gynecology;  Laterality: Bilateral;  need bed   SALPINGECTOMY Right    right   TONSILLECTOMY     WRIST SURGERY Left    x 2    Allergies:  Allergies  Allergen Reactions   Ampicillin Diarrhea and Nausea And Vomiting    Fever   Cymbalta  [Duloxetine  Hcl] Nausea Only    Constipation, bloating   Doxycycline  Diarrhea   Fentanyl Citrate Nausea And Vomiting    fever   Penicillins Diarrhea and Nausea And Vomiting    Fever    Family History:  Family History  Problem Relation Age of Onset   Breast cancer Mother    Lung cancer Father        Died in 06-01-12   Heart disease Father    Irritable bowel syndrome Sister    Breast cancer Maternal Aunt    Breast cancer Maternal Aunt    Ovarian  cancer Maternal Aunt    Breast cancer Maternal Aunt    Breast cancer Maternal Aunt    Breast cancer Maternal Aunt    Breast cancer Maternal Grandmother    Ovarian cancer Maternal Grandmother    Prostate cancer Maternal Grandfather    Colon cancer Maternal Grandfather    ADD / ADHD Son    Colon polyps Neg Hx    Esophageal cancer Neg Hx    Kidney disease Neg Hx    Gallbladder disease Neg Hx    Rectal cancer Neg Hx    Stomach cancer Neg Hx    Crohn's disease Neg Hx     Social History:  Social History   Tobacco Use   Smoking status: Never   Smokeless tobacco: Never  Vaping Use   Vaping status: Never Used  Substance Use Topics   Alcohol use: No    Alcohol/week: 0.0  standard drinks of alcohol   Drug use: No    Review of symptoms:  Constitutional:  Negative for unexplained weight loss, night sweats, fever, chills ENT:  Negative for nose bleeds, sinus pain, painful swallowing CV:  Negative for chest pain, shortness of breath, exercise intolerance, palpitations, loss of consciousness Resp:  Negative for cough, wheezing, shortness of breath GI:  Negative for nausea, vomiting, diarrhea, bloody stools GU:  Positives noted in HPI; otherwise negative for gross hematuria, urinary incontinence Neuro:  Negative for seizures, poor balance, limb weakness, slurred speech Psych:  Negative for lack of energy, depression, anxiety Endocrine:  Negative for polydipsia, polyuria, symptoms of hypoglycemia (dizziness, hunger, sweating) Hematologic:  Negative for anemia, purpura, petechia, prolonged or excessive bleeding, use of anticoagulants  Allergic:  Negative for difficulty breathing or choking as a result of exposure to anything; no shellfish allergy; no allergic response (rash/itch) to materials, foods  Physical exam: BP 113/74   Pulse 91   Ht 5' 4 (1.626 m)   Wt 120 lb (54.4 kg)   LMP  (LMP Unknown)   BMI 20.60 kg/m  GENERAL APPEARANCE:  Well appearing, well developed, well nourished, NAD HEENT: Atraumatic, Normocephalic, oropharynx clear. NECK: Supple without lymphadenopathy or thyromegaly. LUNGS: Clear to auscultation bilaterally. HEART: Regular Rate and Rhythm without murmurs, gallops, or rubs. ABDOMEN: Soft, non-tender, No Masses. EXTREMITIES: Moves all extremities well.  Without clubbing, cyanosis, or edema. NEUROLOGIC:  Alert and oriented x 3, normal gait, CN II-XII grossly intact.  MENTAL STATUS:  Appropriate. BACK: Tender to palpation over the left paraspinal area.  No CVAT SKIN:  Warm, dry and intact.    Results: U/A: 0-5 WBCs, 3-10 RBCs

## 2024-06-03 ENCOUNTER — Ambulatory Visit (AMBULATORY_SURGERY_CENTER)

## 2024-06-03 ENCOUNTER — Encounter: Payer: Self-pay | Admitting: Gastroenterology

## 2024-06-03 VITALS — BP 115/74 | Ht 64.0 in | Wt 120.0 lb

## 2024-06-03 DIAGNOSIS — Z860100 Personal history of colon polyps, unspecified: Secondary | ICD-10-CM

## 2024-06-03 DIAGNOSIS — K635 Polyp of colon: Secondary | ICD-10-CM

## 2024-06-03 MED ORDER — NA SULFATE-K SULFATE-MG SULF 17.5-3.13-1.6 GM/177ML PO SOLN: 1.0000 | Freq: Once | ORAL | 0 refills | Status: AC

## 2024-06-03 MED ORDER — NA SULFATE-K SULFATE-MG SULF 17.5-3.13-1.6 GM/177ML PO SOLN: 1.0000 | Freq: Once | ORAL | 0 refills | Status: DC

## 2024-06-03 NOTE — Progress Notes (Signed)

## 2024-06-03 NOTE — Addendum Note (Signed)
 Addended by: ANN DERRAL CROME on: 06/03/2024 09:26 AM   Modules accepted: Orders

## 2024-06-17 ENCOUNTER — Ambulatory Visit: Admitting: Gastroenterology

## 2024-06-17 ENCOUNTER — Encounter: Payer: Self-pay | Admitting: Gastroenterology

## 2024-06-17 VITALS — BP 110/83 | HR 74 | Temp 98.0°F | Resp 14 | Ht 64.0 in | Wt 120.0 lb

## 2024-06-17 DIAGNOSIS — Z860101 Personal history of adenomatous and serrated colon polyps: Secondary | ICD-10-CM | POA: Diagnosis not present

## 2024-06-17 DIAGNOSIS — D123 Benign neoplasm of transverse colon: Secondary | ICD-10-CM

## 2024-06-17 DIAGNOSIS — Z1211 Encounter for screening for malignant neoplasm of colon: Secondary | ICD-10-CM | POA: Diagnosis present

## 2024-06-17 DIAGNOSIS — D120 Benign neoplasm of cecum: Secondary | ICD-10-CM | POA: Diagnosis not present

## 2024-06-17 DIAGNOSIS — Z1509 Genetic susceptibility to other malignant neoplasm: Secondary | ICD-10-CM | POA: Diagnosis not present

## 2024-06-17 DIAGNOSIS — D122 Benign neoplasm of ascending colon: Secondary | ICD-10-CM

## 2024-06-17 DIAGNOSIS — D124 Benign neoplasm of descending colon: Secondary | ICD-10-CM

## 2024-06-17 DIAGNOSIS — Z860100 Personal history of colon polyps, unspecified: Secondary | ICD-10-CM

## 2024-06-17 MED ORDER — SODIUM CHLORIDE 0.9 % IV SOLN: 500.0000 mL | INTRAVENOUS | Status: DC

## 2024-06-17 NOTE — Progress Notes (Signed)
 Report given to PACU, vss

## 2024-06-17 NOTE — Patient Instructions (Signed)
 YOU HAD AN ENDOSCOPIC PROCEDURE TODAY AT THE [facility]:   Refer to the procedure report that was given to you for any specific questions about what was found during the examination.  If the procedure report does not answer your questions, please call your gastroenterologist to clarify.  If you requested that your care partner not be given the details of your procedure findings, then the procedure report has been included in a sealed envelope for you to review at your convenience later.  YOU SHOULD EXPECT: Some feelings of bloating in the abdomen. Passage of more gas than usual.  Walking can help get rid of the air that was put into your GI tract during the procedure and reduce the bloating. If you had a lower endoscopy (such as a colonoscopy or flexible sigmoidoscopy) you may notice spotting of blood in your stool or on the toilet paper. If you underwent a bowel prep for your procedure, you may not have a normal bowel movement for a few days.  Please Note:  You might notice some irritation and congestion in your nose or some drainage.  This is from the oxygen used during your procedure.  There is no need for concern and it should clear up in a day or so.  SYMPTOMS TO REPORT IMMEDIATELY:  Following lower endoscopy (colonoscopy or flexible sigmoidoscopy):  Excessive amounts of blood in the stool  Significant tenderness or worsening of abdominal pains  Swelling of the abdomen that is new, acute  Fever of 100F or higher  Following upper endoscopy (EGD)  Vomiting of blood or coffee ground material  New chest pain or pain under the shoulder blades  Painful or persistently difficult swallowing  New shortness of breath  Fever of 100F or higher  Black, tarry-looking stools  Resume previous diet Await pathology results Return to normal activities tomorrow   For urgent or emergent issues, a gastroenterologist can be reached at any hour by calling [phone]. Do not use MyChart  messaging for urgent concerns.    DIET:  We do recommend a small meal at first, but then you may proceed to your regular diet.  Drink plenty of fluids but you should avoid alcoholic beverages for 24 hours.  ACTIVITY:  You should plan to take it easy for the rest of today and you should NOT DRIVE or use heavy machinery until tomorrow (because of the sedation medicines used during the test).    FOLLOW UP: Our staff will call the number listed on your records the next business day following your procedure.  We will call around 7:15- 8:00 am to check on you and address any questions or concerns that you may have regarding the information given to you following your procedure. If we do not reach you, we will leave a message.     If any biopsies were taken you will be contacted by phone or by letter within the next 1-3 weeks.  Please call us  at [phone] if you have not heard about the biopsies in 3 weeks.    SIGNATURES/CONFIDENTIALITY: You and/or your care partner have signed paperwork which will be entered into your electronic medical record.  These signatures attest to the fact that that the information above on your After Visit Summary has been reviewed and is understood.  Full responsibility of the confidentiality of this discharge information lies with you and/or your care-partner.

## 2024-06-17 NOTE — Progress Notes (Signed)
 Called to room to assist during endoscopic procedure.  Patient ID and intended procedure confirmed with present staff. Received instructions for my participation in the procedure from the performing physician.

## 2024-06-17 NOTE — Op Note (Signed)
 [facility] Patient Name: Sheila [name] Procedure Date: 06/17/2024 10:44 AM MRN: [id number] Endoscopist: Victory L. Legrand , MD, [id number] Age: 58 Referring MD:  Date of Birth: 12/04/1965 Gender: Female Account #: [id number] Procedure:                Upper GI endoscopy Indications:              Colonic polyposis condition                           Adenomatous colonic polyposis with genetic testing                            revealing VUS in PMS2 gene                           This is the first examination of the upper GI tract                            to evaluate for polyps. Medicines:                Monitored Anesthesia Care Procedure:                Pre-Anesthesia Assessment:                           - Prior to the procedure, a History and Physical                            was performed, and patient medications and                            allergies were reviewed. The patient's tolerance of                            previous anesthesia was also reviewed. The risks                            and benefits of the procedure and the sedation                            options and risks were discussed with the patient.                            All questions were answered, and informed consent                            was obtained. Prior Anticoagulants: The patient has                            taken no anticoagulant or antiplatelet agents. ASA                            Grade Assessment: II - A patient with mild systemic  disease. After reviewing the risks and benefits,                            the patient was deemed in satisfactory condition to                            undergo the procedure.                           After obtaining informed consent, the endoscope was                            passed under direct vision. Throughout the                            procedure, the patient's blood pressure, pulse, and                             oxygen saturations were monitored continuously. The                            GIF [id number] #[id number] was introduced through the                            mouth, and advanced to the second part of duodenum.                            The upper GI endoscopy was accomplished without                            difficulty. The patient tolerated the procedure                            well. Scope In: Scope Out: Findings:                 The esophagus was normal.                           The stomach was normal. (Entire stomach examined                            under WL and NBI                           The cardia and gastric fundus were normal on                            retroflexion.                           The examined duodenum was normal. (Entire duodenum                            examined under WL and NBI) Complications:            No  immediate complications. Estimated Blood Loss:     Estimated blood loss: none. Impression:               - Normal esophagus.                           - Normal stomach.                           - Normal examined duodenum.                           - No specimens collected. Recommendation:           - Patient has a contact number available for                            emergencies. The signs and symptoms of potential                            delayed complications were discussed with the                            patient. Return to normal activities tomorrow.                            Written discharge instructions were provided to the                            patient.                           - Resume previous diet.                           - Continue present medications.                           - See the other procedure note for documentation of                            additional recommendations. Destina Mantei L. Legrand, MD 06/17/2024 12:16:15 PM This report has been signed electronically.

## 2024-06-17 NOTE — Progress Notes (Signed)
1056 Robinul 0.1 mg IV given due large amount of secretions upon assessment.  MD made aware, vss

## 2024-06-17 NOTE — Op Note (Signed)
 [facility] Patient Name: Sheila [name] Procedure Date: 06/17/2024 10:44 AM MRN: [id number] Endoscopist: Victory L. 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 The patient's tolerance of                            previous anesthesia was also reviewed. The risks                            and benefits of the procedure and the sedation                            options and risks were discussed with the patient.                            All questions were answered, and informed consent                            was obtained. Prior Anticoagulants: The patient has                            taken no anticoagulant or antiplatelet agents. ASA  Grade Assessment: II - A patient with mild systemic                            disease. After reviewing the risks and benefits,                            the patient was deemed in satisfactory condition to                            undergo the procedure.                           After obtaining informed consent, the colonoscope                            was passed under direct vision. Throughout the                            procedure, the patient's  blood pressure, pulse, and                            oxygen saturations were monitored continuously. The                            CF [id number] #[id number] was introduced through the anus                            and advanced to the the cecum, identified by                            appendiceal orifice and ileocecal valve. The                            colonoscopy was performed without difficulty. The                            patient tolerated the procedure well. The quality                            of the bowel preparation was excellent. The                            ileocecal valve, appendiceal orifice, and rectum                            were photographed. Scope In: 11:06:16 AM Scope Out: 11:46:24 AM Scope Withdrawal Time: 0 hours 34 minutes 28 seconds  Total Procedure Duration: 0 hours 40 minutes 8 seconds  Findings:                 The perianal and digital rectal examinations were                            normal.  Repeat examination of right colon under NBI                            performed.NBI was also used throughout the                            remainder of scope withdrawal and examination.                           Four flat and semi-sessile polyps were found in the                            proximal ascending colon and cecum. The polyps were                            diminutive in size. These polyps were removed with                            a cold snare. Resection and retrieval were                            complete. (Jar 1)                           Eight flat and semi-sessile polyps were found in                            the ascending colon. The polyps were diminutive in                            size. These polyps were removed with a cold snare.                            Resection and retrieval were complete. (Jar 2)                           Four flat, sessile and semi-sessile polyps were                            found in  the proximal transverse colon(1) and                            ascending colon(3). The polyps were 1 to 5 mm in                            size. These polyps were removed with a cold snare.                            Resection and retrieval were complete. (Jar 3)                           Six sessile and semi-sessile polyps were found in  the distal transverse colon(2) and proximal                            ascending colon(4). The polyps were diminutive in                            size. These polyps were removed with a cold snare.                            Resection and retrieval were complete.                           A few small-mouthed diverticula were found in the                            left colon.                           Internal hemorrhoids were found. The hemorrhoids                            were small.                           Retroflexion in the rectum was not performed due to                            narrow anatomy.                           The exam was otherwise without abnormality.                           Most, if not all, of these polyps appear                            adenomatous by WL and NBI examination. (Pathology                            will tell)                           Also as before, many of these polyps are flat or                            semisessile, difficult to visualize, and often                            clustered together such that several would be                            removed with one snare specimen. Complications:            No immediate complications. Estimated Blood Loss:     Estimated blood loss was minimal. Impression:               -  Four diminutive polyps in the proximal ascending                            colon and in the cecum, removed with a cold snare.                            Resected and retrieved.                           - Eight diminutive polyps in the ascending colon,                             removed with a cold snare. Resected and retrieved.                           - Four 1 to 5 mm polyps in the proximal transverse                            colon and in the ascending colon, removed with a                            cold snare. Resected and retrieved.                           - Six diminutive polyps in the distal transverse                            colon and in the proximal ascending colon, removed                            with a cold snare. Resected and retrieved.                           - Diverticulosis in the left colon.                           - Internal hemorrhoids.                           - The examination was otherwise normal. Recommendation:           - Patient has a contact number available for                            emergencies. The signs and symptoms of potential                            delayed complications were discussed with the                            patient. Return to normal activities tomorrow.                            Written discharge instructions were provided to the  patient.                           - Resume previous diet.                           - Continue present medications.                           - Await pathology results.                           - Repeat colonoscopy is recommended for                            surveillance. The colonoscopy date will be                            determined after pathology results from today's                            exam become available for review.                           - Further discussion with patient and consideration                            of MDC discussion regarding long-term management of                            this polyposis condition behaving like attenuated                            FAP.                           See EGD report from today Jacorian Golaszewski L. Legrand, MD 06/17/2024 12:11:22 PM This report has been signed  electronically.

## 2024-06-17 NOTE — Progress Notes (Signed)
 History and Physical:  This patient presents for endoscopic testing for: Encounter Diagnoses  Name Primary?   PMS2 gene mutation positive Yes   Hx of colonic polyps     58 year old woman known to me from colonoscopies in April 2024 and April 2025, both of which had dozens of adenomatous polyps.  She had adenomatous polyps to a lesser degree on prior colonoscopies, including in spring 2023 with Dr. Teressa.  She is here today for close follow-up colonoscopy for assessment of polyps as well as examination of the upper GI tract given this apparent polyposis syndrome.  She is known to have a variant of uncertain significance in the PMS2 gene determined on genetic testing by her gynecologist.  Patient is otherwise without complaints or active issues today.   Past Medical History: Past Medical History:  Diagnosis Date   AAT (alpha-1-antitrypsin) deficiency (HCC)    Anal fissure    Anxiety    Asthma    uses daily   Cancer (HCC)    per gentic test   Chronic interstitial cystitis    Crohn disease (HCC)    Fibromyalgia    Macular degeneration disease    Migraines    Pneumonia 2007   SLE (systemic lupus erythematosus) (HCC)      Past Surgical History: Past Surgical History:  Procedure Laterality Date   adnoids     BLADDER SURGERY     couple   CESAREAN SECTION     COLONOSCOPY     CYSTOSCOPY     KNEE ARTHROSCOPY     COUPLE   LAPAROSCOPIC VAGINAL HYSTERECTOMY WITH SALPINGO OOPHORECTOMY Bilateral 01/24/2020   Procedure: LAPAROSCOPIC ASSISTED VAGINAL HYSTERECTOMY WITH BILATERAL OOPHORECTOMY AND LEFT  SALPINGECTOMY;  Surgeon: Leva Rush, MD;  Location: [facility];  Service: Gynecology;  Laterality: Bilateral;  need bed   SALPINGECTOMY Right    right   TONSILLECTOMY     WRIST SURGERY Left    x 2    Allergies: Allergies  Allergen Reactions   Penicillins Diarrhea, Nausea And Vomiting, Nausea Only and Other (See Comments)    Fever   Ampicillin Diarrhea,  Nausea And Vomiting and Other (See Comments)    Fever   Duloxetine  Other (See Comments)    duloxetine  hydrochloride Nausea and diarrhea   Fentanyl Citrate Other (See Comments)    fentanyl citrate Nausea, fever   Doxycycline  Diarrhea   Duloxetine  Hcl Nausea Only and Other (See Comments)    Constipation, bloating  Constipation, bloating, nausea   Fentanyl Citrate Nausea And Vomiting    fever    Outpatient Meds: Current Outpatient Medications  Medication Sig Dispense Refill   ALPRAZolam  (XANAX ) 1 MG tablet TAKE 1/2 (0.5) TO 1 TABLET BY MOUTH 3 TIMES A DAY AS NEEDED 90 tablet 1   Cholecalciferol (VITAMIN D3) 50 MCG (2000 UT) capsule Take 2,000 Units by mouth daily.     cyanocobalamin  2000 MCG tablet Take 2,000 mcg by mouth daily. (Patient taking differently: Take 2,000 mcg by mouth once a week.)     rosuvastatin (CRESTOR) 5 MG tablet Take 1 tablet by mouth daily.     topiramate  (TOPAMAX ) 100 MG tablet TAKE 1 TABLET BY MOUTH DAILY 30 tablet 2   venlafaxine  XR (EFFEXOR -XR) 37.5 MG 24 hr capsule Take 1 capsule (37.5 mg total) by mouth daily with breakfast. (Patient taking differently: Take 37.5 mg by mouth 3 (three) times a week.) 90 capsule 3   scopolamine  (TRANSDERM-SCOP) 1 MG/3DAYS Place 1 patch (1.5 mg total) onto the  skin every 3 (three) days. (Patient not taking: No sig reported) 10 patch 12   tiZANidine  (ZANAFLEX ) 4 MG tablet TAKE 1 TABLET BY MOUTH TWICE DAILY AS NEEDED (Patient taking differently: Take 4 mg by mouth as needed. TAKE 1 TABLET BY MOUTH TWICE DAILY AS NEEDED) 60 tablet 1   Current Facility-Administered Medications  Medication Dose Route Frequency Provider Last Rate Last Admin   0.9 %  sodium chloride  infusion  500 mL Intravenous Continuous Danis, Victory CROME III, MD          ___________________________________________________________________ Objective   Exam:  BP 116/76   Pulse 84   Temp 98 F (36.7 C) (Temporal)   Ht 5' 4 (1.626 m)   Wt 120 lb (54.4 kg)    LMP  (LMP Unknown)   SpO2 98%   BMI 20.60 kg/m   CV: regular , S1/S2 Resp: clear to auscultation bilaterally, normal RR and effort noted GI: soft, no tenderness, with active bowel sounds.   Assessment: Encounter Diagnoses  Name Primary?   PMS2 gene mutation positive Yes   Hx of colonic polyps      Plan: Colonoscopy EGD  The benefits and risks of the planned procedure(s) were described in detail with the patient or (when appropriate) their health care proxy.  Risks were outlined as including, but not limited to, bleeding, infection, perforation, adverse medication reaction leading to cardiac or pulmonary decompensation, pancreatitis (if ERCP).  The limitation of incomplete mucosal visualization was also discussed.  No guarantees or warranties were given.  The patient is appropriate for an endoscopic procedure in the ambulatory setting.   - Victory Brand, MD

## 2024-06-17 NOTE — Progress Notes (Signed)
 Pt's states no medical or surgical changes since previsit or office visit.

## 2024-06-18 ENCOUNTER — Telehealth: Payer: Self-pay

## 2024-06-18 NOTE — Telephone Encounter (Signed)
 No answer on follow up call - voice mail message left

## 2024-06-18 NOTE — Telephone Encounter (Addendum)
  Follow up Call-     06/17/2024   10:01 AM 01/08/2024   10:12 AM 12/23/2022    9:35 AM 02/05/2022    8:38 AM  Call back number  Post procedure Call Back phone  # [phone]  Permission to leave phone message Yes Yes Yes Yes       No answer on follow up call - voice mail message left

## 2024-06-21 ENCOUNTER — Ambulatory Visit: Payer: Self-pay | Admitting: Gastroenterology

## 2024-06-21 LAB — SURGICAL PATHOLOGY

## 2024-06-23 ENCOUNTER — Encounter: Payer: Self-pay | Admitting: Gastroenterology

## 2024-06-23 NOTE — Telephone Encounter (Signed)
 Brandi,  Please send a referral to Dr. Lamar Ards of the [facility] GI division, specifically the [facility].  The referral is for adenomatous polyposis syndrome.  We should be sure to send along my last 3 colonoscopy and pathology reports and the colonoscopy and pathology report by Dr. Teressa from 2023.  (Office notes would be available through epic, but endoscopy reports may not be visible from an outside health system)  Thank you  VEAR Brand, MD

## 2024-06-24 NOTE — Telephone Encounter (Signed)
 All information printed and referral faxed to [facility]. Fax number [phone].

## 2024-06-24 NOTE — Telephone Encounter (Signed)
 The first fax to [facility] came back as no answer. Re-faxed all information to [phone].

## 2024-06-25 NOTE — Telephone Encounter (Signed)
 Received return fax that referral did not go through to [phone]. I called the office to verify the correct fax number. She reports that the fax number for the clinic is [phone]. I will try to re-fax the information to this number.

## 2024-06-25 NOTE — Telephone Encounter (Signed)
 Received confirmation fax that referral has successfully been faxed to [facility].

## 2024-07-01 ENCOUNTER — Other Ambulatory Visit: Payer: Self-pay | Admitting: Family Medicine

## 2024-07-01 DIAGNOSIS — G43009 Migraine without aura, not intractable, without status migrainosus: Secondary | ICD-10-CM

## 2024-08-07 ENCOUNTER — Telehealth

## 2024-08-07 DIAGNOSIS — B029 Zoster without complications: Secondary | ICD-10-CM

## 2024-08-08 MED ORDER — VALACYCLOVIR HCL 1 G PO TABS: 1000.0000 mg | ORAL_TABLET | Freq: Three times a day (TID) | ORAL | 0 refills | Status: AC

## 2024-08-08 NOTE — Progress Notes (Signed)
 E-visit for Shingles   We are sorry that you are not feeling well. Here is how we plan to help!  Shingles or herpes zoster, is a common infection of the nerves.  It is a painful rash caused by the herpes zoster virus.  This is the same virus that causes chickenpox.  After a person has chickenpox, the virus remains inactive in the nerve cells.  Years later, the virus can become active again and travel to the skin.  It typically will appear on one side of the face or body.  Burning or shooting pain, tingling, or itching are early signs of the infection.  Blisters typically scab over in 7 to 10 days and clear up within 2-4 weeks. Shingles is only contagious to people that have never had the chickenpox, the chickenpox vaccine, or anyone who has a compromised immune system.  You should avoid contact with these type of people until your blisters scab over.  I have prescribed Valacyclovir  1g three times daily for 7 days and also Gabapentin  300mg  twice daily as needed for pain   HOME CARE: Apply ice packs (wrapped in a thin towel), cool compresses, or soak in cool bath to help reduce pain. Use calamine lotion to calm itchy skin. Avoid scratching the rash. Avoid direct sunlight.  GET HELP RIGHT AWAY IF: Symptoms that don't away after treatment. A rash or blisters near your eye. Increased drainage, fever, or rash after treatment. Severe pain that doesn't go away.   MAKE SURE YOU   Understand these instructions. Will watch your condition. Will get help right away if you are not doing well or get worse.  Thank you for choosing an e-visit. Your e-visit answers were reviewed by a board certified advanced clinical practitioner to complete your personal care plan. Depending upon the condition, your plan could have included both over the counter or prescription medications.  Please review your pharmacy choice. Make sure the pharmacy is open so you can pick up prescription now. If there is a problem,  you may contact your provider through [employer] and have the prescription routed to another pharmacy.  Your safety is important to us . If you have drug allergies check your prescription carefully.   For the next 24 hours you can use MyChart to ask questions about today's visit, request a non-urgent call back, or ask for a work or school excuse.  You will get an email in the next two days asking about your experience. I hope that your e-visit has been valuable and will speed your recovery  I have spent 5 minutes in review of e-visit questionnaire, review and updating patient chart, medical decision making and response to patient.   Aury Scollard W Camara Rosander, NP

## 2024-08-26 ENCOUNTER — Telehealth: Payer: Self-pay | Admitting: Gastroenterology

## 2024-08-26 NOTE — Telephone Encounter (Signed)
 Recall has been entered

## 2024-08-26 NOTE — Telephone Encounter (Signed)
 I spoke with Dr Jesse regarding his recommendations for this patient.  (His full note is not yet complete but there is a brief summary of the encounter visible through [facility])  Please do the following:  Set a colonoscopy recall for October 2025  Arrange an appointment for the patient to see me regarding ongoing digestive symptoms that she is reportedly having.  H Danis

## 2024-08-26 NOTE — Telephone Encounter (Signed)
 Yes, recall colonoscopy October 2026 (2025 was a typo).  HD

## 2024-08-26 NOTE — Telephone Encounter (Signed)
 Appt made to see Dr Legrand on 10/07/24 at 9 am Letter mailed and sent to My Chart

## 2024-08-26 NOTE — Telephone Encounter (Signed)
 Dr. Jesse, [facility] is calling to discuss some details about the patient. Please return call to [phone].

## 2024-08-26 NOTE — Telephone Encounter (Signed)
 Dr Legrand I want to confirm that you meant 06/2025 for the colon recall

## 2024-09-28 ENCOUNTER — Encounter: Payer: Self-pay | Admitting: Gastroenterology

## 2024-09-30 ENCOUNTER — Other Ambulatory Visit (INDEPENDENT_AMBULATORY_CARE_PROVIDER_SITE_OTHER)

## 2024-09-30 DIAGNOSIS — R10A0 Flank pain, unspecified side: Secondary | ICD-10-CM

## 2024-09-30 DIAGNOSIS — E785 Hyperlipidemia, unspecified: Secondary | ICD-10-CM

## 2024-09-30 LAB — CBC WITH DIFFERENTIAL/PLATELET
Basophils Absolute: 0 K/uL (ref 0.0–0.1)
Basophils Relative: 0.6 % (ref 0.0–3.0)
Eosinophils Absolute: 0.1 K/uL (ref 0.0–0.7)
Eosinophils Relative: 1.8 % (ref 0.0–5.0)
HCT: 39.5 % (ref 36.0–46.0)
Hemoglobin: 13.3 g/dL (ref 12.0–15.0)
Lymphocytes Relative: 35.7 % (ref 12.0–46.0)
Lymphs Abs: 2.4 K/uL (ref 0.7–4.0)
MCHC: 33.7 g/dL (ref 30.0–36.0)
MCV: 92.7 fl (ref 78.0–100.0)
Monocytes Absolute: 0.6 K/uL (ref 0.1–1.0)
Monocytes Relative: 8.4 % (ref 3.0–12.0)
Neutro Abs: 3.7 K/uL (ref 1.4–7.7)
Neutrophils Relative %: 53.5 % (ref 43.0–77.0)
Platelets: 294 K/uL (ref 150.0–400.0)
RBC: 4.26 Mil/uL (ref 3.87–5.11)
RDW: 13.7 % (ref 11.5–15.5)
WBC: 6.8 K/uL (ref 4.0–10.5)

## 2024-09-30 LAB — LIPID PANEL
Cholesterol: 168 mg/dL (ref 28–200)
HDL: 72.8 mg/dL
LDL Cholesterol: 84 mg/dL (ref 10–99)
NonHDL: 94.88
Total CHOL/HDL Ratio: 2
Triglycerides: 52 mg/dL (ref 10.0–149.0)
VLDL: 10.4 mg/dL (ref 0.0–40.0)

## 2024-09-30 LAB — COMPREHENSIVE METABOLIC PANEL WITH GFR
ALT: 11 U/L (ref 3–35)
AST: 19 U/L (ref 5–37)
Albumin: 4.6 g/dL (ref 3.5–5.2)
Alkaline Phosphatase: 80 U/L (ref 39–117)
BUN: 15 mg/dL (ref 6–23)
CO2: 28 meq/L (ref 19–32)
Calcium: 9.3 mg/dL (ref 8.4–10.5)
Chloride: 107 meq/L (ref 96–112)
Creatinine, Ser: 0.83 mg/dL (ref 0.40–1.20)
GFR: 78.75 mL/min
Glucose, Bld: 87 mg/dL (ref 70–99)
Potassium: 4.3 meq/L (ref 3.5–5.1)
Sodium: 141 meq/L (ref 135–145)
Total Bilirubin: 0.6 mg/dL (ref 0.2–1.2)
Total Protein: 7.4 g/dL (ref 6.0–8.3)

## 2024-09-30 LAB — TSH: TSH: 0.89 u[IU]/mL (ref 0.35–5.50)

## 2024-10-04 ENCOUNTER — Ambulatory Visit: Payer: Self-pay | Admitting: Family Medicine

## 2024-10-07 ENCOUNTER — Ambulatory Visit: Admitting: Gastroenterology

## 2024-10-13 ENCOUNTER — Other Ambulatory Visit: Payer: Self-pay | Admitting: Obstetrics and Gynecology

## 2024-10-13 DIAGNOSIS — Z803 Family history of malignant neoplasm of breast: Secondary | ICD-10-CM

## 2024-10-19 ENCOUNTER — Encounter: Payer: Self-pay | Admitting: Gastroenterology

## 2024-10-19 ENCOUNTER — Ambulatory Visit: Admitting: Gastroenterology

## 2024-10-19 VITALS — BP 80/46 | HR 58 | Ht 64.0 in | Wt 132.2 lb

## 2024-10-19 DIAGNOSIS — R1084 Generalized abdominal pain: Secondary | ICD-10-CM

## 2024-10-19 DIAGNOSIS — K580 Irritable bowel syndrome with diarrhea: Secondary | ICD-10-CM | POA: Diagnosis not present

## 2024-10-19 DIAGNOSIS — Z860100 Personal history of colon polyps, unspecified: Secondary | ICD-10-CM | POA: Diagnosis not present

## 2024-10-19 MED ORDER — HYOSCYAMINE SULFATE 0.125 MG SL SUBL: 0.1250 mg | SUBLINGUAL_TABLET | Freq: Four times a day (QID) | SUBLINGUAL | 1 refills | Status: AC | PRN

## 2024-10-19 NOTE — Progress Notes (Addendum)
 "     [name]  Chief Complaint: Abdominal pain and diarrhea  Subjective  Prior history  Former patient of Sheila [name] with a reported history of Crohn's disease.  Later colonoscopies by Sheila [name], including in May 2023 with no evidence of Crohn's.  5 adenomatous polyps removed on that exam, and 5 adenomatous polyps on 2020 colonoscopy Has interstitial cystitis, pelvic floor dysfunction and fibromyalgia followed by urology and other consultants. Evaluated at [name] February 2024 for severe anorectal/pelvic pain and concern for fissure.  MRI pelvis with some questionable rectal wall thickening, patient treated with antibiotics and seen by Sheila [name] of colorectal surgery.  Treated empirically for possible fissure with diltiazem  ointment, referred back to us  for colonoscopy 2 to reports of diarrhea.  Colonoscopy early April 2024 with normal terminal ileum, normal colonic mucosa and biopsies negative for microscopic colitis.  No fissure or other visible anorectal pathology seen.  Incidentally, an additional 23 adenomatous polyps were removed, most of which were subcentimeter and often clustered together some over 10 mm.  No high-grade dysplasia on any of those polyps, 1 year colonoscopy recall recommended Patient also advised to have a TTG antibody drawn and lactulose breath testing to rule out SIBO as a cause of diarrhea.  See phone note-patient did not want to go to our offices lab due to a reported bad experience, order sent to a lab near her home.  She also reported that she was getting more relief of her anorectal spasm pain from diltiazem  then from nitroglycerin , so went back to using that.  Has been diagnosed with a colonic polyposis syndrome with a variant of uncertain significance (VUS) in the PMS2 gene , and based on colonoscopy findings from April 2024, April 2025 and October 2025. Saw Sheila [name] at [facility] regarding the polyposis syndrome in 08/25/2024.  He recommended  no colonic resection at that time, repeat colonoscopy October 2026 and consideration of TCA for patient's functional abdominal/pelvic pain and diarrhea. This note also outlined the proposed plan regarding polyp surveillance and when surgery might be considered.   Discussed the use of AI scribe software for clinical note transcription with the patient, who gave verbal consent to proceed.  History of Present Illness Sheila [name] is a 59 year old female with irritable bowel syndrome, colonic polyps, PMS2 gene mutation (VUS), interstitial cystitis, and pelvic floor dysfunction who presents for follow-up of chronic bowel symptoms.  Gastrointestinal Symptoms: - Chronic frequent bowel movements, typically 5-7 times daily - Stool consistency varies from formed to soft but not watery or bloody. - Postprandial discomfort with sensation that food stops here (operative date abdomen) - Persistent bloating and sensation of incomplete evacuation - Defecation associated with pain and pelvic pressure; avoids straining - Intermittent rectal bleeding a couple of times per month, attributed to fissures or frequent bowel movements - Exhaustion related to high frequency of bowel movements - Marked decline in quality of life and activity level compared to previous health - Extensive prior evaluation: negative celiac serologies, negative colon biopsies for microscopic colitis, negative breath testing for small intestinal bacterial overgrowth  Pelvic Floor and Urological Symptoms: - Persistent pelvic floor dysfunction - Ongoing interstitial cystitis with urinary frequency and heightened sensitivity - Discontinued urology follow-up after stopping medication due to vision side effects Plans to return to establish with a new urologist (previous when she saw for decades retired)  Medication History: - Previous medication burden up to 18 medications, currently reduced   ROS:  Cardiovascular:  no chest  pain Respiratory: no dyspnea  The patient's Past Medical, Family and Social History were reviewed and are on file in the EMR. Past Medical History:  Diagnosis Date   AAT (alpha-1-antitrypsin) deficiency (HCC)    Anal fissure    Anxiety    Asthma    uses daily   Cancer (HCC)    per gentic test   Chronic interstitial cystitis    Crohn disease (HCC)    Fibromyalgia    Macular degeneration disease    Migraines    Pneumonia 2007   SLE (systemic lupus erythematosus) (HCC)     Past Surgical History:  Procedure Laterality Date   adnoids     BLADDER SURGERY     couple   CESAREAN SECTION     COLONOSCOPY     CYSTOSCOPY     KNEE ARTHROSCOPY     COUPLE   LAPAROSCOPIC VAGINAL HYSTERECTOMY WITH SALPINGO OOPHORECTOMY Bilateral 01/24/2020   Procedure: LAPAROSCOPIC ASSISTED VAGINAL HYSTERECTOMY WITH BILATERAL OOPHORECTOMY AND LEFT  SALPINGECTOMY;  Surgeon: Sheila Rush, MD;  Location: [facility] [place];  Service: Gynecology;  Laterality: Bilateral;  need bed   SALPINGECTOMY Right    right   TONSILLECTOMY     WRIST SURGERY Left    x 2     Objective:  Med list reviewed Current Medications[1]   Vital signs in last 24 hrs: Vitals:   10/19/24 0959  BP: (!) 80/46  Pulse: (!) 58   BP checked manually by MD and was 105/80  Wt Readings from Last 3 Encounters:  10/19/24 132 lb 3 oz (60 kg)  06/17/24 120 lb (54.4 kg)  06/03/24 120 lb (54.4 kg)    Physical Exam Husband present for entire visit Well-appearing, thin as before HEENT: sclera anicteric, oral mucosa moist without lesions Neck: supple, no thyromegaly, JVD or lymphadenopathy Cardiac: Without appreciable murmur,  no peripheral edema Pulm: clear to auscultation bilaterally, normal RR and effort noted Abdomen: soft, mild generalized tenderness, with active bowel sounds. No guarding or palpable hepatosplenomegaly.  No distention, no bruit Skin; warm and dry, no jaundice or  rash   Labs:   ___________________________________________ Radiologic studies:   ____________________________________________ Other:   _____________________________________________   Encounter Diagnoses  Name Primary?   Irritable bowel syndrome with diarrhea Yes   Generalized abdominal pain     Assessment & Plan Irritable bowel syndrome Chronic  disorder with increased stool frequency, postprandial discomfort, and pelvic floor dysfunction.  Stools relatively formed.  All of this happening in the milieu of pelvic floor dysfunction and interstitial cystitis, also functional disorders.  Antispasmodic therapy preferred for initial management. - Prescribed hyoscyamine  (Levsin ) at bedtime for 2-3 days, then add morning dose if tolerated. - Instructed to portal message in 2 weeks to report response. - Discussed dose escalation or therapy switch if insufficient improvement. - Advised against Viberzi due to constipation risk. - Recommended urology follow-up for pelvic floor/interstitial cystitis symptoms.  If antispasmodic not helpful or tolerated, will try low-dose TCA at bedtime (i.e. amitriptyline or desipramine)  Colonic polyps Multiple small polyps under surveillance, no malignancy evidence. Ongoing surveillance due to polyp history and genetic risk. - Scheduled surveillance colonoscopy for approximately one year from last procedure.  (October 2026) -recall placed   Plan:   I spent total of 35 minutes in both face-to-face (25 minutes interview/exam) and non-face-to-face (10 minutes chart review, care coordination, documentation)  activities, excluding procedures performed, for the visit on the date of this encounter.  Victory LITTIE Brand III     [1]  Current Outpatient Medications:    ALPRAZolam  (XANAX ) 1 MG tablet, TAKE 1/2 (0.5) TO 1 TABLET BY MOUTH 3 TIMES A DAY AS NEEDED, Disp: 90 tablet, Rfl: 1   Cholecalciferol (VITAMIN D3) 50 MCG (2000 UT) capsule, Take 2,000 Units  by mouth daily., Disp: , Rfl:    cyanocobalamin  2000 MCG tablet, Take 2,000 mcg by mouth daily. (Patient taking differently: Take 2,000 mcg by mouth once a week.), Disp: , Rfl:    rosuvastatin (CRESTOR) 5 MG tablet, Take 1 tablet by mouth daily., Disp: , Rfl:    tiZANidine  (ZANAFLEX ) 4 MG tablet, TAKE 1 TABLET BY MOUTH TWICE DAILY AS NEEDED (Patient taking differently: Take 4 mg by mouth as needed. TAKE 1 TABLET BY MOUTH TWICE DAILY AS NEEDED), Disp: 60 tablet, Rfl: 1   topiramate  (TOPAMAX ) 100 MG tablet, TAKE 1 TABLET BY MOUTH DAILY, Disp: 90 tablet, Rfl: 1   venlafaxine  XR (EFFEXOR -XR) 37.5 MG 24 hr capsule, Take 1 capsule (37.5 mg total) by mouth daily with breakfast. (Patient taking differently: Take 37.5 mg by mouth 3 (three) times a week.), Disp: 90 capsule, Rfl: 3  "

## 2024-10-19 NOTE — Patient Instructions (Signed)
 _______________________________________________________  If your blood pressure at your visit was 140/90 or greater, please contact your primary care physician to follow up on this.  _______________________________________________________  If you are age 57 or older, your body mass index should be between 23-30. Your Body mass index is 22.69 kg/m. If this is out of the aforementioned range listed, please consider follow up with your Primary Care Provider.  If you are age 43 or younger, your body mass index should be between 19-25. Your Body mass index is 22.69 kg/m. If this is out of the aformentioned range listed, please consider follow up with your Primary Care Provider.   ________________________________________________________  The [facility] providers would like to encourage you to use MYCHART to communicate with providers for non-urgent requests or questions.  Due to long hold times on the telephone, sending your provider a message by [facility] may be a faster and more efficient way to get a response.  Please allow 48 business hours for a response.  Please remember that this is for non-urgent requests.  _______________________________________________________  [facility] is using a team-based approach to care.  Your team is made up of your doctor and two to three APPS. Our APPS (Nurse Practitioners and Physician Assistants) work with your physician to ensure care continuity for you. They are fully qualified to address your health concerns and develop a treatment plan. They communicate directly with your gastroenterologist to care for you. Seeing the Advanced Practice Practitioners on your physician's team can help you by facilitating care more promptly, often allowing for earlier appointments, access to diagnostic testing, procedures, and other specialty referrals.    Thank you for trusting me with your gastrointestinal care!    Dr. Victory Legrand DOUGLAS [facility]

## 2024-11-15 ENCOUNTER — Other Ambulatory Visit
